# Patient Record
Sex: Female | Born: 1970
Health system: Southern US, Community
[De-identification: ages and names within clinical notes are randomized; demographics above are authoritative.]

## PROBLEM LIST (undated history)

## (undated) DIAGNOSIS — G459 Transient cerebral ischemic attack, unspecified: Secondary | ICD-10-CM

## (undated) DIAGNOSIS — E119 Type 2 diabetes mellitus without complications: Secondary | ICD-10-CM

## (undated) DIAGNOSIS — E669 Obesity, unspecified: Secondary | ICD-10-CM

## (undated) DIAGNOSIS — I82409 Acute embolism and thrombosis of unspecified deep veins of unspecified lower extremity: Secondary | ICD-10-CM

## (undated) DIAGNOSIS — G4730 Sleep apnea, unspecified: Secondary | ICD-10-CM

## (undated) DIAGNOSIS — J45909 Unspecified asthma, uncomplicated: Secondary | ICD-10-CM

## (undated) DIAGNOSIS — E7800 Pure hypercholesterolemia, unspecified: Secondary | ICD-10-CM

## (undated) DIAGNOSIS — I639 Cerebral infarction, unspecified: Secondary | ICD-10-CM

## (undated) DIAGNOSIS — I10 Essential (primary) hypertension: Secondary | ICD-10-CM

## (undated) DIAGNOSIS — D509 Iron deficiency anemia, unspecified: Secondary | ICD-10-CM

## (undated) HISTORY — PX: TUBAL LIGATION: SHX77

---

## 2007-07-04 ENCOUNTER — Emergency Department (HOSPITAL_COMMUNITY): Admission: EM | Admit: 2007-07-04 | Discharge: 2007-07-04 | Payer: Self-pay | Admitting: Family Medicine

## 2008-07-09 ENCOUNTER — Emergency Department (HOSPITAL_BASED_OUTPATIENT_CLINIC_OR_DEPARTMENT_OTHER): Admission: EM | Admit: 2008-07-09 | Discharge: 2008-07-09 | Payer: Self-pay | Admitting: Emergency Medicine

## 2009-03-19 ENCOUNTER — Emergency Department (HOSPITAL_BASED_OUTPATIENT_CLINIC_OR_DEPARTMENT_OTHER): Admission: EM | Admit: 2009-03-19 | Discharge: 2009-03-20 | Payer: Self-pay | Admitting: Emergency Medicine

## 2009-06-04 ENCOUNTER — Emergency Department (HOSPITAL_BASED_OUTPATIENT_CLINIC_OR_DEPARTMENT_OTHER): Admission: EM | Admit: 2009-06-04 | Discharge: 2009-06-04 | Payer: Self-pay | Admitting: Emergency Medicine

## 2009-06-04 ENCOUNTER — Ambulatory Visit: Payer: Self-pay | Admitting: Radiology

## 2009-09-05 ENCOUNTER — Emergency Department (HOSPITAL_BASED_OUTPATIENT_CLINIC_OR_DEPARTMENT_OTHER): Admission: EM | Admit: 2009-09-05 | Discharge: 2009-09-05 | Payer: Self-pay | Admitting: Emergency Medicine

## 2009-09-05 ENCOUNTER — Ambulatory Visit: Payer: Self-pay | Admitting: Diagnostic Radiology

## 2010-01-04 ENCOUNTER — Emergency Department (HOSPITAL_BASED_OUTPATIENT_CLINIC_OR_DEPARTMENT_OTHER): Admission: EM | Admit: 2010-01-04 | Discharge: 2010-01-04 | Payer: Self-pay | Admitting: Emergency Medicine

## 2010-03-17 ENCOUNTER — Emergency Department (HOSPITAL_BASED_OUTPATIENT_CLINIC_OR_DEPARTMENT_OTHER): Admission: EM | Admit: 2010-03-17 | Discharge: 2010-03-17 | Payer: Self-pay | Admitting: Emergency Medicine

## 2010-04-14 ENCOUNTER — Emergency Department (HOSPITAL_BASED_OUTPATIENT_CLINIC_OR_DEPARTMENT_OTHER): Admission: EM | Admit: 2010-04-14 | Discharge: 2010-04-14 | Payer: Self-pay | Admitting: Emergency Medicine

## 2010-07-17 ENCOUNTER — Emergency Department (HOSPITAL_BASED_OUTPATIENT_CLINIC_OR_DEPARTMENT_OTHER): Admission: EM | Admit: 2010-07-17 | Discharge: 2010-07-17 | Payer: Self-pay | Admitting: Emergency Medicine

## 2010-09-29 ENCOUNTER — Emergency Department (HOSPITAL_BASED_OUTPATIENT_CLINIC_OR_DEPARTMENT_OTHER)
Admission: EM | Admit: 2010-09-29 | Discharge: 2010-09-29 | Payer: Self-pay | Source: Home / Self Care | Admitting: Emergency Medicine

## 2010-10-01 LAB — URINALYSIS, ROUTINE W REFLEX MICROSCOPIC
Bilirubin Urine: NEGATIVE
Hgb urine dipstick: NEGATIVE
Ketones, ur: NEGATIVE mg/dL
Nitrite: NEGATIVE
Protein, ur: NEGATIVE mg/dL
Specific Gravity, Urine: 1.034 — ABNORMAL HIGH (ref 1.005–1.030)
Urine Glucose, Fasting: NEGATIVE mg/dL
Urobilinogen, UA: 0.2 mg/dL (ref 0.0–1.0)
pH: 6.5 (ref 5.0–8.0)

## 2010-10-01 LAB — PREGNANCY, URINE: Preg Test, Ur: NEGATIVE

## 2010-11-11 ENCOUNTER — Emergency Department (HOSPITAL_BASED_OUTPATIENT_CLINIC_OR_DEPARTMENT_OTHER)
Admission: EM | Admit: 2010-11-11 | Discharge: 2010-11-11 | Disposition: A | Discharge status: Home / Self Care | Payer: Self-pay | Attending: Emergency Medicine | Admitting: Emergency Medicine

## 2010-11-11 DIAGNOSIS — M79609 Pain in unspecified limb: Secondary | ICD-10-CM | POA: Insufficient documentation

## 2010-11-11 DIAGNOSIS — M722 Plantar fascial fibromatosis: Secondary | ICD-10-CM | POA: Insufficient documentation

## 2010-11-25 LAB — URINE MICROSCOPIC-ADD ON

## 2010-11-25 LAB — URINALYSIS, ROUTINE W REFLEX MICROSCOPIC
Bilirubin Urine: NEGATIVE
Glucose, UA: NEGATIVE mg/dL
Hgb urine dipstick: NEGATIVE
Ketones, ur: NEGATIVE mg/dL
Nitrite: NEGATIVE
Protein, ur: NEGATIVE mg/dL
Specific Gravity, Urine: 1.02 (ref 1.005–1.030)
Urobilinogen, UA: 1 mg/dL (ref 0.0–1.0)
pH: 7 (ref 5.0–8.0)

## 2010-11-25 LAB — GC/CHLAMYDIA PROBE AMP, GENITAL
Chlamydia, DNA Probe: NEGATIVE
GC Probe Amp, Genital: NEGATIVE

## 2010-11-25 LAB — WET PREP, GENITAL: Yeast Wet Prep HPF POC: NONE SEEN

## 2010-11-25 LAB — PREGNANCY, URINE: Preg Test, Ur: NEGATIVE

## 2010-11-30 LAB — URINALYSIS, ROUTINE W REFLEX MICROSCOPIC
Bilirubin Urine: NEGATIVE
Glucose, UA: NEGATIVE mg/dL
Hgb urine dipstick: NEGATIVE
Ketones, ur: NEGATIVE mg/dL
Nitrite: NEGATIVE
Protein, ur: NEGATIVE mg/dL
Specific Gravity, Urine: 1.025 (ref 1.005–1.030)
Urobilinogen, UA: 1 mg/dL (ref 0.0–1.0)
pH: 6 (ref 5.0–8.0)

## 2010-11-30 LAB — RAPID STREP SCREEN (MED CTR MEBANE ONLY): Streptococcus, Group A Screen (Direct): NEGATIVE

## 2010-11-30 LAB — PREGNANCY, URINE: Preg Test, Ur: NEGATIVE

## 2010-12-04 ENCOUNTER — Emergency Department (HOSPITAL_BASED_OUTPATIENT_CLINIC_OR_DEPARTMENT_OTHER)
Admission: EM | Admit: 2010-12-04 | Discharge: 2010-12-04 | Disposition: A | Discharge status: Home / Self Care | Payer: Self-pay | Attending: Emergency Medicine | Admitting: Emergency Medicine

## 2010-12-04 ENCOUNTER — Emergency Department (INDEPENDENT_AMBULATORY_CARE_PROVIDER_SITE_OTHER): Payer: Self-pay

## 2010-12-04 DIAGNOSIS — R0602 Shortness of breath: Secondary | ICD-10-CM

## 2010-12-04 DIAGNOSIS — R0609 Other forms of dyspnea: Secondary | ICD-10-CM | POA: Insufficient documentation

## 2010-12-04 DIAGNOSIS — R0789 Other chest pain: Secondary | ICD-10-CM

## 2010-12-04 DIAGNOSIS — R0989 Other specified symptoms and signs involving the circulatory and respiratory systems: Secondary | ICD-10-CM | POA: Insufficient documentation

## 2010-12-04 DIAGNOSIS — J45909 Unspecified asthma, uncomplicated: Secondary | ICD-10-CM | POA: Insufficient documentation

## 2010-12-04 LAB — CBC
HCT: 32.3 % — ABNORMAL LOW (ref 36.0–46.0)
Hemoglobin: 10.8 g/dL — ABNORMAL LOW (ref 12.0–15.0)
MCH: 26.4 pg (ref 26.0–34.0)
MCHC: 33.4 g/dL (ref 30.0–36.0)
MCV: 79 fL (ref 78.0–100.0)
Platelets: 295 10*3/uL (ref 150–400)
RBC: 4.09 MIL/uL (ref 3.87–5.11)
RDW: 14.1 % (ref 11.5–15.5)
WBC: 8.2 10*3/uL (ref 4.0–10.5)

## 2010-12-04 LAB — D-DIMER, QUANTITATIVE: D-Dimer, Quant: 0.39 ug/mL-FEU (ref 0.00–0.48)

## 2010-12-04 LAB — POCT B-TYPE NATRIURETIC PEPTIDE (BNP): B Natriuretic Peptide, POC: 7.1 pg/mL (ref 0–100)

## 2010-12-04 LAB — BASIC METABOLIC PANEL
Calcium: 8.9 mg/dL (ref 8.4–10.5)
GFR calc Af Amer: >60 mL/min (ref >60)
GFR calc non Af Amer: >60 mL/min (ref >60)
Potassium: 3.7 mEq/L (ref 3.5–5.1)
Sodium: 143 mEq/L (ref 135–145)

## 2010-12-04 LAB — DIFFERENTIAL
Basophils Absolute: 0 10*3/uL (ref 0.0–0.1)
Basophils Relative: 0 % (ref 0–1)
Eosinophils Absolute: 0.1 10*3/uL (ref 0.0–0.7)
Eosinophils Relative: 2 % (ref 0–5)
Lymphocytes Relative: 46 % (ref 12–46)
Lymphs Abs: 3.7 10*3/uL (ref 0.7–4.0)
Monocytes Absolute: 0.9 10*3/uL (ref 0.1–1.0)
Monocytes Relative: 11 % (ref 3–12)
Neutro Abs: 3.4 10*3/uL (ref 1.7–7.7)
Neutrophils Relative %: 41 % — ABNORMAL LOW (ref 43–77)

## 2010-12-04 LAB — POCT CARDIAC MARKERS
CKMB, poc: 1.1 ng/mL (ref 1.0–8.0)
Myoglobin, poc: 58.2 ng/mL (ref 12–200)
Troponin i, poc: 0.08 ng/mL (ref 0.00–0.09)

## 2011-02-28 ENCOUNTER — Emergency Department (HOSPITAL_BASED_OUTPATIENT_CLINIC_OR_DEPARTMENT_OTHER)
Admission: EM | Admit: 2011-02-28 | Discharge: 2011-03-01 | Disposition: A | Discharge status: Home / Self Care | Payer: Self-pay | Attending: Emergency Medicine | Admitting: Emergency Medicine

## 2011-02-28 DIAGNOSIS — H9209 Otalgia, unspecified ear: Secondary | ICD-10-CM | POA: Insufficient documentation

## 2011-02-28 DIAGNOSIS — M79609 Pain in unspecified limb: Secondary | ICD-10-CM | POA: Insufficient documentation

## 2011-03-01 ENCOUNTER — Ambulatory Visit (HOSPITAL_BASED_OUTPATIENT_CLINIC_OR_DEPARTMENT_OTHER)
Admission: RE | Admit: 2011-03-01 | Discharge: 2011-03-01 | Disposition: A | Discharge status: Home / Self Care | Payer: Self-pay | Source: Ambulatory Visit | Attending: Emergency Medicine | Admitting: Emergency Medicine

## 2011-03-01 DIAGNOSIS — M7989 Other specified soft tissue disorders: Secondary | ICD-10-CM | POA: Insufficient documentation

## 2011-03-01 DIAGNOSIS — M79609 Pain in unspecified limb: Secondary | ICD-10-CM | POA: Insufficient documentation

## 2011-04-04 ENCOUNTER — Emergency Department (HOSPITAL_BASED_OUTPATIENT_CLINIC_OR_DEPARTMENT_OTHER)
Admission: EM | Admit: 2011-04-04 | Discharge: 2011-04-04 | Disposition: A | Discharge status: Other Acute Inpatient Hospital | Payer: Self-pay | Attending: Emergency Medicine | Admitting: Emergency Medicine

## 2011-04-04 ENCOUNTER — Other Ambulatory Visit: Payer: Self-pay

## 2011-04-04 ENCOUNTER — Emergency Department (INDEPENDENT_AMBULATORY_CARE_PROVIDER_SITE_OTHER): Payer: Self-pay

## 2011-04-04 ENCOUNTER — Inpatient Hospital Stay (HOSPITAL_COMMUNITY)
Admission: AD | Admit: 2011-04-04 | Discharge: 2011-04-07 | DRG: 069 | Disposition: A | Discharge status: Home / Self Care | Payer: Self-pay | Source: Other Acute Inpatient Hospital | Attending: Internal Medicine | Admitting: Internal Medicine

## 2011-04-04 ENCOUNTER — Encounter: Payer: Self-pay | Admitting: Emergency Medicine

## 2011-04-04 DIAGNOSIS — R29898 Other symptoms and signs involving the musculoskeletal system: Secondary | ICD-10-CM | POA: Diagnosis present

## 2011-04-04 DIAGNOSIS — E785 Hyperlipidemia, unspecified: Secondary | ICD-10-CM | POA: Diagnosis present

## 2011-04-04 DIAGNOSIS — Q211 Atrial septal defect: Secondary | ICD-10-CM

## 2011-04-04 DIAGNOSIS — I824Z9 Acute embolism and thrombosis of unspecified deep veins of unspecified distal lower extremity: Secondary | ICD-10-CM | POA: Diagnosis present

## 2011-04-04 DIAGNOSIS — R42 Dizziness and giddiness: Secondary | ICD-10-CM | POA: Insufficient documentation

## 2011-04-04 DIAGNOSIS — G459 Transient cerebral ischemic attack, unspecified: Principal | ICD-10-CM | POA: Diagnosis present

## 2011-04-04 DIAGNOSIS — M6281 Muscle weakness (generalized): Secondary | ICD-10-CM

## 2011-04-04 DIAGNOSIS — J45909 Unspecified asthma, uncomplicated: Secondary | ICD-10-CM | POA: Diagnosis present

## 2011-04-04 DIAGNOSIS — Z8673 Personal history of transient ischemic attack (TIA), and cerebral infarction without residual deficits: Secondary | ICD-10-CM

## 2011-04-04 DIAGNOSIS — R61 Generalized hyperhidrosis: Secondary | ICD-10-CM

## 2011-04-04 DIAGNOSIS — D649 Anemia, unspecified: Secondary | ICD-10-CM | POA: Diagnosis present

## 2011-04-04 DIAGNOSIS — R110 Nausea: Secondary | ICD-10-CM

## 2011-04-04 DIAGNOSIS — G43909 Migraine, unspecified, not intractable, without status migrainosus: Secondary | ICD-10-CM | POA: Diagnosis present

## 2011-04-04 DIAGNOSIS — R51 Headache: Secondary | ICD-10-CM

## 2011-04-04 DIAGNOSIS — Z8679 Personal history of other diseases of the circulatory system: Secondary | ICD-10-CM | POA: Insufficient documentation

## 2011-04-04 DIAGNOSIS — Q2111 Secundum atrial septal defect: Secondary | ICD-10-CM

## 2011-04-04 DIAGNOSIS — F17200 Nicotine dependence, unspecified, uncomplicated: Secondary | ICD-10-CM | POA: Diagnosis present

## 2011-04-04 DIAGNOSIS — R5381 Other malaise: Secondary | ICD-10-CM | POA: Insufficient documentation

## 2011-04-04 DIAGNOSIS — Z6841 Body Mass Index (BMI) 40.0 and over, adult: Secondary | ICD-10-CM

## 2011-04-04 DIAGNOSIS — Z7901 Long term (current) use of anticoagulants: Secondary | ICD-10-CM

## 2011-04-04 HISTORY — DX: Obesity, unspecified: E66.9

## 2011-04-04 LAB — CARDIAC PANEL(CRET KIN+CKTOT+MB+TROPI)
CK, MB: 1.5 ng/mL (ref 0.3–4.0)
Relative Index: 1.1 (ref 0.0–2.5)
Total CK: 140 U/L (ref 7–177)

## 2011-04-04 LAB — COMPREHENSIVE METABOLIC PANEL
Albumin: 3.4 g/dL — ABNORMAL LOW (ref 3.5–5.2)
Alkaline Phosphatase: 103 U/L (ref 39–117)
BUN: 10 mg/dL (ref 6–23)
CO2: 26 mEq/L (ref 19–32)
Chloride: 101 mEq/L (ref 96–112)
Glucose, Bld: 99 mg/dL (ref 70–99)
Potassium: 4 mEq/L (ref 3.5–5.1)
Total Bilirubin: 0.2 mg/dL — ABNORMAL LOW (ref 0.3–1.2)

## 2011-04-04 LAB — CBC
Hemoglobin: 10.8 g/dL — ABNORMAL LOW (ref 12.0–15.0)
RBC: 4.22 MIL/uL (ref 3.87–5.11)
WBC: 5.9 10*3/uL (ref 4.0–10.5)

## 2011-04-04 LAB — DIFFERENTIAL
Basophils Relative: 1 % (ref 0–1)
Lymphocytes Relative: 47 % — ABNORMAL HIGH (ref 12–46)
Lymphs Abs: 2.8 10*3/uL (ref 0.7–4.0)
Monocytes Relative: 10 % (ref 3–12)
Neutro Abs: 2.5 10*3/uL (ref 1.7–7.7)
Neutrophils Relative %: 41 % — ABNORMAL LOW (ref 43–77)

## 2011-04-04 LAB — URINALYSIS, ROUTINE W REFLEX MICROSCOPIC
Glucose, UA: NEGATIVE mg/dL
Hgb urine dipstick: NEGATIVE
Protein, ur: NEGATIVE mg/dL

## 2011-04-04 LAB — PROTIME-INR: Prothrombin Time: 13.4 seconds (ref 11.6–15.2)

## 2011-04-04 MED ORDER — ASPIRIN 325 MG PO TABS
325.0000 mg | ORAL_TABLET | Freq: Once | ORAL | Status: AC
  Administered 2011-04-04: 325 mg via ORAL
  Filled 2011-04-04: qty 1

## 2011-04-04 NOTE — ED Provider Notes (Signed)
History     Chief Complaint  Patient presents with  . Extremity Weakness   HPI Comments: Pt was at work last night and felt hot and sweaty and had weakness in the left side of her body.  This happened about midnight.  The weakness lasted for about one hour.  There was no pain.  She got off work, working at [employer], about 6 A.M. and felt the weakness in her left side again.  She therefore sought evaluation.  Patient is a 40 y.o. female presenting with Acute Neurological Problem. The history is provided by the patient. No language interpreter was used.  Cerebrovascular Accident This is a new problem. The current episode started 6 to 12 hours ago. The problem has not changed since onset.The symptoms are aggravated by nothing. The symptoms are relieved by nothing. She has tried nothing for the symptoms.    Past Medical History  Diagnosis Date  . Obesity     Past Surgical History  Procedure Date  . Tubal ligation     History reviewed. No pertinent family history.  History  Substance Use Topics  . Smoking status: Current Some Day Smoker  . Smokeless tobacco: Not on file  . Alcohol Use: No    OB History    Grav Para Term Preterm Abortions TAB SAB Ect Mult Living                  Review of Systems  Constitutional: Positive for fatigue.  HENT: Negative.   Eyes: Negative.   Respiratory: Negative.   Cardiovascular: Negative.   Gastrointestinal: Negative.   Genitourinary: Negative.   Musculoskeletal: Negative.   Neurological: Positive for weakness and light-headedness.  Psychiatric/Behavioral: Negative.     Physical Exam  BP 165/98  Pulse 90  Temp(Src) 98.2 F (36.8 C) (Oral)  Resp 18  SpO2 100%  Physical Exam  Constitutional: She is oriented to person, place, and time. She appears well-developed and well-nourished. No distress.  HENT:  Head: Normocephalic and atraumatic.  Right Ear: External ear normal.  Left Ear: External ear normal.  Eyes:  Conjunctivae and EOM are normal. Pupils are equal, round, and reactive to light.  Neck: Normal range of motion. Neck supple.       No carotid bruit.  Cardiovascular: Normal rate, regular rhythm and normal heart sounds.   Pulmonary/Chest: Effort normal and breath sounds normal.  Abdominal: Soft. Bowel sounds are normal.  Musculoskeletal: Normal range of motion.  Neurological: She is alert and oriented to person, place, and time. No cranial nerve deficit or sensory deficit. GCS eye subscore is 4. GCS verbal subscore is 5. GCS motor subscore is 6.       Mild left sided weakness, more notable in the left leg than in the left arm.  DTR's negative, perhaps because of pt's body habitus.  Skin: Skin is warm and dry.  Psychiatric: She has a normal mood and affect. Her behavior is normal.    ED Course  Procedures  MDM  Pt seen --> physical exam performed.  Lab workup for TIA ordered.  ASA 325 mg po ordered.   ED ECG REPORT   Date: 04/04/2011  EKG Time: 7:42 AM  Rate:81  Rhythm: normal sinus rhythm  Axis: Normal  Intervals: Incomplete right bundle branch block  ST&T Change: WNL  Narrative Interpretation: Borderline EKG.    9:30 A.M.  Lab results reviewed.  CBC shows mild anemia with Hb 10.8 and Hct 33.  CMET, PT/PTT WNL.  UA negative.  CT of head is negative.       Case discussed with Thana Farr, M.D., neurologist, who requests that pt have MRI of brain now, and then call her back with the result.  12:45 P.M.  MRI of brain was negative.  Call to Dr. Thad Ranger --> pt can be admitted at [facility] for TIA workup, and she will consult.    1:10 P.M.  Case discussed with Dr. Rossie Muskrat, who accepts pt in transfer to [facility]   Carleene Cooper III, MD 04/04/11 1304

## 2011-04-04 NOTE — ED Notes (Signed)
Pt c/o left sided weakness. Pt reports episode at midnight of left sided weakness with diaphoresis lasting approx 25min-1 hour. Pt reports weakness returned this morning. Pt is is able to move all extremities without difficulty. Pt ambulatory without difficulty. No slurred speech noted.

## 2011-04-05 ENCOUNTER — Inpatient Hospital Stay (HOSPITAL_COMMUNITY): Payer: Self-pay

## 2011-04-05 LAB — IRON AND TIBC
Iron: 61 ug/dL (ref 42–135)
Saturation Ratios: 17 % — ABNORMAL LOW (ref 20–55)
TIBC: 356 ug/dL (ref 250–470)
UIBC: 295 ug/dL

## 2011-04-05 LAB — LIPID PANEL
LDL Cholesterol: 134 mg/dL — ABNORMAL HIGH (ref 0–99)
Triglycerides: 68 mg/dL (ref <150)

## 2011-04-05 LAB — HOMOCYSTEINE: Homocysteine: 6.8 umol/L (ref 4.0–15.4)

## 2011-04-05 LAB — FOLATE: Folate: 14.2 ng/mL

## 2011-04-05 LAB — FERRITIN: Ferritin: 16 ng/mL (ref 10–291)

## 2011-04-05 LAB — HEMOGLOBIN A1C: Hgb A1c MFr Bld: 5.8 % — ABNORMAL HIGH (ref <5.7)

## 2011-04-05 LAB — GLUCOSE, CAPILLARY: Glucose-Capillary: 86 mg/dL (ref 70–99)

## 2011-04-06 DIAGNOSIS — I6789 Other cerebrovascular disease: Secondary | ICD-10-CM

## 2011-04-06 DIAGNOSIS — G459 Transient cerebral ischemic attack, unspecified: Secondary | ICD-10-CM

## 2011-04-06 LAB — LUPUS ANTICOAGULANT PANEL: Lupus Anticoagulant: NOT DETECTED

## 2011-04-06 LAB — PROTEIN C ACTIVITY: Protein C Activity: 149 % — ABNORMAL HIGH (ref 75–133)

## 2011-04-06 LAB — PROTEIN S, TOTAL: Protein S Ag, Total: 136 % (ref 60–150)

## 2011-04-06 LAB — ANTITHROMBIN III: AntiThromb III Func: 85 % (ref 76–126)

## 2011-04-06 LAB — PROTIME-INR: INR: 0.99 (ref 0.00–1.49)

## 2011-04-06 LAB — PROTHROMBIN GENE MUTATION

## 2011-04-07 LAB — CBC
MCH: 26.1 pg (ref 26.0–34.0)
MCV: 78.5 fL (ref 78.0–100.0)
Platelets: 312 10*3/uL (ref 150–400)
RBC: 4.37 MIL/uL (ref 3.87–5.11)

## 2011-04-07 LAB — BETA-2-GLYCOPROTEIN I ABS, IGG/M/A: Beta-2-Glycoprotein I IgA: 3 A Units (ref <20)

## 2011-04-07 LAB — HIV ANTIBODY (ROUTINE TESTING W REFLEX): HIV: NONREACTIVE

## 2011-04-07 LAB — CARDIOLIPIN ANTIBODIES, IGG, IGM, IGA
Anticardiolipin IgA: 5 APL U/mL — ABNORMAL LOW (ref <22)
Anticardiolipin IgM: 2 MPL U/mL — ABNORMAL LOW (ref <11)

## 2011-04-08 ENCOUNTER — Encounter (HOSPITAL_BASED_OUTPATIENT_CLINIC_OR_DEPARTMENT_OTHER): Payer: Self-pay | Admitting: *Deleted

## 2011-04-08 ENCOUNTER — Emergency Department (HOSPITAL_BASED_OUTPATIENT_CLINIC_OR_DEPARTMENT_OTHER)
Admission: EM | Admit: 2011-04-08 | Discharge: 2011-04-08 | Disposition: A | Discharge status: Home / Self Care | Payer: Self-pay | Attending: Emergency Medicine | Admitting: Emergency Medicine

## 2011-04-08 ENCOUNTER — Emergency Department (INDEPENDENT_AMBULATORY_CARE_PROVIDER_SITE_OTHER): Payer: Self-pay

## 2011-04-08 DIAGNOSIS — E669 Obesity, unspecified: Secondary | ICD-10-CM | POA: Insufficient documentation

## 2011-04-08 DIAGNOSIS — E7800 Pure hypercholesterolemia, unspecified: Secondary | ICD-10-CM | POA: Insufficient documentation

## 2011-04-08 DIAGNOSIS — M549 Dorsalgia, unspecified: Secondary | ICD-10-CM | POA: Insufficient documentation

## 2011-04-08 DIAGNOSIS — IMO0002 Reserved for concepts with insufficient information to code with codable children: Secondary | ICD-10-CM

## 2011-04-08 DIAGNOSIS — Z8679 Personal history of other diseases of the circulatory system: Secondary | ICD-10-CM | POA: Insufficient documentation

## 2011-04-08 HISTORY — DX: Cerebral infarction, unspecified: I63.9

## 2011-04-08 HISTORY — DX: Acute embolism and thrombosis of unspecified deep veins of unspecified lower extremity: I82.409

## 2011-04-08 HISTORY — DX: Pure hypercholesterolemia, unspecified: E78.00

## 2011-04-08 LAB — ANA: Anti Nuclear Antibody(ANA): NEGATIVE

## 2011-04-08 MED ORDER — CYCLOBENZAPRINE HCL 10 MG PO TABS: 10.0000 mg | ORAL_TABLET | Freq: Two times a day (BID) | ORAL | Status: AC | PRN

## 2011-04-08 MED ORDER — HYDROCODONE-ACETAMINOPHEN 5-500 MG PO TABS: 1.0000 | ORAL_TABLET | Freq: Four times a day (QID) | ORAL | Status: AC | PRN

## 2011-04-08 MED ORDER — DIAZEPAM 5 MG PO TABS
5.0000 mg | ORAL_TABLET | Freq: Once | ORAL | Status: AC
  Administered 2011-04-08: 5 mg via ORAL
  Filled 2011-04-08: qty 1

## 2011-04-08 MED ORDER — HYDROMORPHONE HCL 1 MG/ML IJ SOLN
1.0000 mg | Freq: Once | INTRAMUSCULAR | Status: AC
  Administered 2011-04-08: 1 mg via INTRAMUSCULAR
  Filled 2011-04-08: qty 1

## 2011-04-08 NOTE — ED Provider Notes (Signed)
History     Chief Complaint  Patient presents with  . Back Pain   Patient is a 40 y.o. female presenting with back pain. The history is provided by the patient.  Back Pain  This is a new problem. The current episode started 3 to 5 hours ago. The problem occurs constantly. The problem has not changed since onset.The pain is associated with twisting. The pain is present in the lumbar spine. The quality of the pain is described as shooting. The pain radiates to the left thigh. The pain is severe. The symptoms are aggravated by certain positions. The pain is the same all the time. Associated symptoms include leg pain. Pertinent negatives include no chest pain, no fever, no numbness, no headaches, no abdominal pain, no perianal numbness, no bladder incontinence, no dysuria, no paresthesias, no paresis, no tingling and no weakness. She has tried nothing for the symptoms. Risk factors include obesity.   Was in bed and when she went to get up had sudden onset of L sided LBP that radiates to her leg, she denies any h/o LBP or prior back injury.  She was recently hopsitalized for TIA and DVT  Past Medical History  Diagnosis Date  . Obesity   . Stroke   . DVT (deep venous thrombosis)   . High cholesterol     Past Surgical History  Procedure Date  . Tubal ligation     History reviewed. No pertinent family history.  History  Substance Use Topics  . Smoking status: Current Some Day Smoker  . Smokeless tobacco: Not on file  . Alcohol Use: No    OB History    Grav Para Term Preterm Abortions TAB SAB Ect Mult Living                  Review of Systems  Constitutional: Negative for fever and chills.  HENT: Negative for neck pain and neck stiffness.   Eyes: Negative for pain.  Respiratory: Negative for cough and shortness of breath.   Cardiovascular: Negative for chest pain.  Gastrointestinal: Negative for abdominal pain.  Genitourinary: Negative for bladder incontinence and dysuria.    Musculoskeletal: Positive for back pain.  Skin: Negative for rash.  Neurological: Negative for tingling, weakness, numbness, headaches and paresthesias.  All other systems reviewed and are negative.    Physical Exam  BP 132/75  Pulse 68  Temp(Src) 98 F (36.7 C) (Oral)  Resp 20  Ht 5\' 4"  (1.626 m)  Wt 320 lb (145.151 kg)  BMI 54.93 kg/m2  SpO2 100%  Physical Exam  Constitutional: She is oriented to person, place, and time. She appears well-developed and well-nourished.  HENT:  Head: Normocephalic and atraumatic.  Eyes: Conjunctivae and EOM are normal. Pupils are equal, round, and reactive to light.  Neck: Full passive range of motion without pain. Neck supple. No thyromegaly present.       No meningismus  Cardiovascular: Normal rate, regular rhythm, S1 normal, S2 normal and intact distal pulses.   Pulmonary/Chest: Effort normal and breath sounds normal.  Abdominal: Soft. Bowel sounds are normal. There is no tenderness. There is no CVA tenderness.  Musculoskeletal: Normal range of motion.       Mod TTP L paralumabar and lower lumbar spine, no LE deficits with equal and intact DTR, strength and sensorium to light touch.   Neurological: She is alert and oriented to person, place, and time. She has normal strength and normal reflexes. No cranial nerve deficit or sensory deficit.  She displays a negative Romberg sign. GCS eye subscore is 4. GCS verbal subscore is 5. GCS motor subscore is 6.       Normal Gait  Skin: Skin is warm and dry. No rash noted. No cyanosis. Nails show no clubbing.  Psychiatric: She has a normal mood and affect. Her speech is normal and behavior is normal.    ED Course  Procedures  MDM Presentation c/w low back injury, has reproducible tenderness.   Recheck at 6:48 AM is feeling a lot better, is able to ambulate, has no red flags to suggest indication for emergent MRI. PT has scheduled PCP f/u in 2 days to check her INR.  Plan conservative Tx of back pain  and close PCP follow up. PT states understanding strict return precautions.       Sunnie Nielsen, MD 04/08/11 [phone]

## 2011-04-08 NOTE — ED Notes (Signed)
Pt has been taking her scheduled doses of coumadin PO and Lovenox SQ, but has not started her simvastatin yet. Pt states she felt fine until midnight tonight, when she got up to urinate and had a sudden severe onset of left lower back pain that radiates downward into the hip and wraps around anteriorly, shooting downward into the right thigh and stops at the knee. Pt has difficulty ambulating, and requires assistance. Son came with pt to the hospital. No obvious swelling noted to leg. Pulses present.

## 2011-04-08 NOTE — ED Notes (Signed)
Left lower back pain x approx 5 hours. Radiates downward through left leg to the knee. Denies any known injury. Denies urinary symptoms. Pt was recently discharged from [facility] yesterday with dx of "light stroke" and left leg DVT. Pt is currently on coumadin/lovenox.

## 2011-04-10 LAB — CARDIOLIPIN ANTIBODIES, IGG, IGM, IGA: Anticardiolipin IgM: 1 MPL U/mL — ABNORMAL LOW (ref <11)

## 2011-04-14 NOTE — H&P (Signed)
NAMEMARQUERITE, Terri Boyd               ACCOUNT NO.:  [id number]  MEDICAL RECORD NO.:  [id number]  LOCATION:  3001                         FACILITY:  [facility]  PHYSICIAN:  Candelaria Celeste, DO      DATE OF BIRTH:  26-Jan-1971  DATE OF ADMISSION:  04/04/2011 DATE OF DISCHARGE:                             HISTORY & PHYSICAL   PRIMARY CARE PROVIDER:  Magnus Sinning. Rice, MD  CHIEF COMPLAINT:  Left-sided weakness.  HISTORY OF PRESENT ILLNESS:  Ms. Boyd is a 40 year old morbidly obese African American female who has borderline hypertension who presents with a left-sided weakness that began early this morning.  The patient states that around midnight last night she was working at [employer] and became flushed, sweating, and started to have heaviness/weakness down in the left side greater in upper extremity than lower extremity.  She stated that she was having a difficult time ambulating due to the heaviness in her leg.  The symptoms lasted for about an hour and then started again as she was leaving work at 6 o'clock in the morning.  She states that nothing improved the symptoms. She states that overall her symptoms have greatly improved, although she still feels some paresthesias in her fingers and toes and states that she still feels mildly weak in her left upper extremity.  She had a migraine yesterday, but otherwise is feeling well.  She denies current headache, vision changes, tinnitus, palpitations, shortness of breath, fevers, shaking chills, night sweats.  PAST MEDICAL HISTORY: 1. History of Bell palsy. 2. Obesity. 3. Mild borderline hypertension. 4. Migraines.  PAST SURGICAL HISTORY:  Bilateral tubal ligation.  MEDICATIONS:  None.  ALLERGIES:  None.  FAMILY HISTORY:  Significant for hypertension and hypercholesterolemia in her mother and father as well as maternal grandmother.  The mother also has a history of leukemia.  SOCIAL HISTORY:  The patient is not currently  married and lives with her four children.  She works 2 jobs.  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 She describes intermittent smoking for the past year though she states that she quit smoking in April of this year.  She drinks wine on occasion and denies illicit drug use.  REVIEW OF SYSTEMS: A 10-system review of systems was performed which was  as stated in HPI, otherwise negative.  PHYSICAL EXAMINATION:  VITAL SIGNS:  Temperature is 98.1, heart rate is 80, blood pressure 130/82, respiratory 17, oxygen saturation 97% on room air. GENERAL:  This is a morbidly obese African American female who is awake, alert, and oriented x3 in no acute distress. HEENT:  Head normocephalic, atraumatic.  Pupils equal, round, reactive to light.  Extraocular muscles intact.  Sclerae are anicteric, noninjected.  Tympanic membranes are translucent with reflected good cone of light.  The posterior oropharynx is nonerythematous with nonenlarged tonsils and uvula rising in the midline. NECK:  Supple without lymphadenopathy.  There is no jugular venous distention or carotid bruit present. LUNGS:  Clear to auscultation bilaterally with no wheezes, rales, or rhonchi. CARDIAC:  Regular rate with normal S1 and S2 sounds with no murmurs auscultated.  ABDOMEN:  Obese, but soft and nontender, nondistended. VASCULAR:  Dorsalis pedis renal pulses 2+ with warm skin and brisk capillary refill. NEURO:  Cranial nerves II through XII are intact.  There is 4/5 strength in the left upper extremity.  Right upper extremity and bilateral lower extremities are 5/5 in muscle strength.  There is a slight lag in rapid alternating movements in the left upper extremity.  Heel-to-shin test was normal as well as finger-to-nose test bilaterally.  There is no pronator drift. MUSCULOSKELETAL:  There is moderate left calf tenderness.  LABORATORY DATA: 1. CBC showed a  hemoglobin of 10.8, hematocrit of 33.0 with white     count of 5.9, and platelets of 332.  CMP was normal. 2. PT, PTT, and INR showed an INR of 1.0.  PT of 13.4 and PTT of 31. 3. Urinalysis was negative for blood, ketones, nitrites, or leukocyte     esterase. 4. CT of the head showed no significant abnormality. 5. MRI showed no focal area of ischemia or hemorrhage.  The     intracranial vascular structures appeared patent.  An EKG was performed, which showed a rate of 81 with a PR interval of 0.142 and a QRS interval of 0.1, which showed normal sinus rhythm with no acute ST elevation or depression.  IMPRESSION: 1. Transient ischemic attack/left-sided weakness. 2. Left calf pain. 3. Borderline hypertension. 4. Obesity.  We will admit the patient under TIA admission criteria.  The patient has a ABCD squared score for 4 thus we will admit the patient to observation.  We will obtain a Neurology consult.  We will continue to monitor the patient in the telemetry and continue with q.4 h. vitals and neurology checks.  We will obtain a fasting lipid profile and a hemoglobin A1c as well as cardiac enzymes.  We will obtain a lower extremity ultrasound duplex to rule out a DVT due to the patient's left calf pain.  For medications, we will start the patient on full aspirin daily, which we well encourage her to take daily as an outpatient.  We will also start the patient on enoxaparin 40 mg subcutaneously for DVT prophylaxis.  Due to the patient's young age, we will obtain a hypercoagulability panel to evaluate for this.          ______________________________ Candelaria Celeste, DO     JS/MEDQ  D:  04/04/2011  T:  39/22/2012  Job:  [id number]  cc:   Magnus Sinning. Rice, M.D.  Electronically Signed by Candelaria Celeste DO on 04/14/2011 04:16:20 PM

## 2011-04-16 NOTE — Discharge Summary (Signed)
  Terri Boyd, Terri Boyd               ACCOUNT NO.:  [id number]  MEDICAL RECORD NO.:  [id number]  LOCATION:  3001                         FACILITY:  [facility]  PHYSICIAN:  Zannie Cove, MD     DATE OF BIRTH:  Aug 05, 1971  DATE OF ADMISSION:  04/04/2011 DATE OF DISCHARGE:                              DISCHARGE SUMMARY   PRIMARY CARE PHYSICIAN:  [facility].  DISCHARGE DIAGNOSES: 1. Transient ischemic attack. 2. Left calf nonocclusive deep venous thrombosis. 3. Normocytic anemia. 4. Morbid obesity. 5. Dyslipidemia. 6. Tobacco use. 7. Small PFO on TEE.  DISCHARGE MEDICATIONS: 1. Coumadin 10 mg p.o. for April 07, 2011, April 08, 2011, and then     based on INR, April 09, 2011. 2. Zocor 20 mg p.o. daily. 3. Lovenox 145 mg subcu b.i.d. for 4-5 days until INR greater than 2. 4. Tylenol 650 mg q.4 h p.r.n.  CONSULTANTS:  Dr. Pearlean Boyd and Dr. Roseanne Reno with Neurology.  DIAGNOSTICS/INVESTIGATIONS:  Include CT of the head, which was unremarkable.  MRI of the brain was normal.  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  HOSPITAL COURSE:  Terri Boyd is a 40 year old morbidly obese African- American female, who presented to the hospital with left-sided weakness and subsequently admitted to be ruled out for TIA.  In addition, she also complained of left leg pain. 1. For her TIA, she had an MRI and MRA done, which is unremarkable,     seen by Dr. Pearlean Boyd and Dr. Roseanne Reno in the hospital.  Initially, put     on aspirin, subsequently transitioned to Coumadin since she was     also found to have a DVT.  In terms of her workup for TIA, she had     a hypercoagulable panel, which included protein C and S, which were     negative, lupus anticoagulant was negative, antithrombin III level     was negative, and her prothrombin gene mutation was negative.  She     will be continued on Coumadin for now to complete her 6 months     course for treatment for DVT and then  switch to full strength     aspirin.  She was also advised to follow up with Dr. Pearlean Boyd in two     months. 2. Nonocclusive left leg DVT, for this hypercoagulable panel as noted     above the negative.  Continued on Lovenox and Coumadin for three     more days of bridging until INR greater than 2 and continue     Coumadin for at least six more months. The patient is advised on weight loss, tobacco cessation, and lifestyle modification.  DISCHARGE FOLLOWUP: 1. [facility] within the next month. 2. INR check on April 09, 2011. 3. Dr. Pearlean Boyd in 2 months.     Zannie Cove, MD     PJ/MEDQ  D:  04/07/2011  T:  04/07/2011  Job:  [id number]  cc:   [facility] Terri P. Pearlean Brownie, MD  Electronically Signed by Zannie Cove  on 04/16/2011 04:42:14 PM

## 2011-12-08 ENCOUNTER — Emergency Department (INDEPENDENT_AMBULATORY_CARE_PROVIDER_SITE_OTHER): Payer: Self-pay

## 2011-12-08 ENCOUNTER — Emergency Department (HOSPITAL_BASED_OUTPATIENT_CLINIC_OR_DEPARTMENT_OTHER)
Admission: EM | Admit: 2011-12-08 | Discharge: 2011-12-08 | Disposition: A | Discharge status: Home Health | Payer: Self-pay | Attending: Emergency Medicine | Admitting: Emergency Medicine

## 2011-12-08 ENCOUNTER — Encounter (HOSPITAL_BASED_OUTPATIENT_CLINIC_OR_DEPARTMENT_OTHER): Payer: Self-pay | Admitting: Family Medicine

## 2011-12-08 DIAGNOSIS — R51 Headache: Secondary | ICD-10-CM

## 2011-12-08 DIAGNOSIS — Z8673 Personal history of transient ischemic attack (TIA), and cerebral infarction without residual deficits: Secondary | ICD-10-CM | POA: Insufficient documentation

## 2011-12-08 DIAGNOSIS — Z79899 Other long term (current) drug therapy: Secondary | ICD-10-CM | POA: Insufficient documentation

## 2011-12-08 DIAGNOSIS — Z86718 Personal history of other venous thrombosis and embolism: Secondary | ICD-10-CM | POA: Insufficient documentation

## 2011-12-08 DIAGNOSIS — E7800 Pure hypercholesterolemia, unspecified: Secondary | ICD-10-CM | POA: Insufficient documentation

## 2011-12-08 DIAGNOSIS — E669 Obesity, unspecified: Secondary | ICD-10-CM | POA: Insufficient documentation

## 2011-12-08 DIAGNOSIS — G43109 Migraine with aura, not intractable, without status migrainosus: Secondary | ICD-10-CM | POA: Insufficient documentation

## 2011-12-08 MED ORDER — METOCLOPRAMIDE HCL 5 MG/ML IJ SOLN
10.0000 mg | Freq: Once | INTRAMUSCULAR | Status: AC
  Administered 2011-12-08: 10 mg via INTRAVENOUS
  Filled 2011-12-08: qty 2

## 2011-12-08 MED ORDER — DIPHENHYDRAMINE HCL 50 MG/ML IJ SOLN
25.0000 mg | Freq: Once | INTRAMUSCULAR | Status: AC
  Administered 2011-12-08: 12:00:00 via INTRAVENOUS
  Filled 2011-12-08: qty 1

## 2011-12-08 MED ORDER — DEXAMETHASONE SODIUM PHOSPHATE 10 MG/ML IJ SOLN
10.0000 mg | Freq: Once | INTRAMUSCULAR | Status: AC
  Administered 2011-12-08: 10 mg via INTRAVENOUS
  Filled 2011-12-08: qty 1

## 2011-12-08 MED ORDER — SODIUM CHLORIDE 0.9 % IV BOLUS (SEPSIS)
1000.0000 mL | Freq: Once | INTRAVENOUS | Status: AC
  Administered 2011-12-08: 1000 mL via INTRAVENOUS

## 2011-12-08 NOTE — ED Notes (Signed)
Patient transported to CT 

## 2011-12-08 NOTE — ED Notes (Signed)
Pt c/o left sided headache off and on x 2 wks. Pt reports "sleep usually resolves headache" but has not this time. Pt denies taking otc med. Pt sts this h/a is similar to previous in past.

## 2011-12-08 NOTE — ED Provider Notes (Signed)
History     CSN: [id number]  Arrival date & time 12/08/11  1100   First MD Initiated Contact with Patient 12/08/11 1132      Chief Complaint  Patient presents with  . Headache    (Consider location/radiation/quality/duration/timing/severity/associated sxs/prior treatment) HPI The patient is a 41 yo F with history of headaches who presents today complaining of headaches over the past few days.  She rates these as an 8/10.  They are focused on the left side of her head and behind her eye.  She had nausea but denies vomiting.  She also endorses heaviness of her left leg.  She denies any other symptoms.  The sensation in her leg has also been going on for several days.  Patient has no other symptoms.  There are no other associated or modifying factors.  Past Medical History  Diagnosis Date  . Obesity   . Stroke   . DVT (deep venous thrombosis)   . High cholesterol     Past Surgical History  Procedure Date  . Tubal ligation     History reviewed. No pertinent family history.  History  Substance Use Topics  . Smoking status: Former Games developer  . Smokeless tobacco: Not on file  . Alcohol Use: No    OB History    Grav Para Term Preterm Abortions TAB SAB Ect Mult Living                  Review of Systems  Eyes: Negative.   Respiratory: Negative.   Cardiovascular: Negative.   Gastrointestinal: Negative.   Genitourinary: Negative.   Musculoskeletal: Negative.   Skin: Negative.   Neurological: Positive for weakness and headaches.  Hematological: Negative.   Psychiatric/Behavioral: Negative.   All other systems reviewed and are negative.    Allergies  Review of patient's allergies indicates no known allergies.  Home Medications   Current Outpatient Rx  Name Route Sig Dispense Refill  . ENOXAPARIN SODIUM 150 MG/ML [place] SOLN Subcutaneous Inject 145 mg into the skin every 12 (twelve) hours.      Marland Kitchen SIMVASTATIN 20 MG PO TABS Oral Take 20 mg by mouth at bedtime.      .  WARFARIN SODIUM 10 MG PO TABS Oral Take 10 mg by mouth daily.        BP 149/94  Pulse 84  Temp 97.9 F (36.6 C)  Resp 16  SpO2 100%  LMP 11/13/2011  Physical Exam  Nursing note and vitals reviewed. GEN: Well-developed, well-nourished female in no distress HEENT: Atraumatic, normocephalic. Oropharynx clear without erythema EYES: PERRLA BL, no scleral icterus. NECK: Trachea midline, no meningismus CV: regular rate and rhythm. No murmurs, rubs, or gallops PULM: No respiratory distress.  No crackles, wheezes, or rales. GI: soft, non-tender. No guarding, rebound, or tenderness. + bowel sounds  GU: deferred Neuro: cranial nerves 2-12 intact, no abnormalities of strength or sensation, A and O x 3 MSK: Patient moves all 4 extremities symmetrically, no deformity, edema, or injury noted Skin: No rashes petechiae, purpura, or jaundice Psych: no abnormality of mood  ED Course  Procedures (including critical care time)  Labs Reviewed - No data to display Ct Head Wo Contrast  12/08/2011  *RADIOLOGY REPORT*  Clinical Data: Left sided headache.  CT HEAD WITHOUT CONTRAST  Technique:  Contiguous axial images were obtained from the base of the skull through the vertex without contrast.  Comparison: 04/04/2011  Findings: No acute intracranial abnormality.  Specifically, no hemorrhage, hydrocephalus, mass lesion, acute infarction,  or significant intracranial injury.  No acute calvarial abnormality. Visualized paranasal sinuses and mastoids clear.  Orbital soft tissues unremarkable.  IMPRESSION: No acute intracranial abnormality.  Original Report Authenticated By: Cyndie Chime, M.D.     1. Complicated migraine       MDM  The patient was evaluated by myself.  She was treated with reglan, benadryl and IVF with resolution of her symptoms.  Given her leg symptoms I did perform a head CT which was negative.  This also resolved with her headache.  Decadron was given and the patient was discharged in  good condition.  She was told to follow up with her regular doctor as needed.        Cyndra Numbers, MD 12/08/11 2156

## 2011-12-08 NOTE — ED Notes (Signed)
Pt returned from CT °

## 2011-12-08 NOTE — Discharge Instructions (Signed)
Recurrent Migraine Headache You have a recurrent migraine headache. The caregiver can usually provide good relief for this headache. If this headache is the same as your previous migraine headaches, it is safe to treat you without repeating a complete evaluation.   These headaches usually have at least two of the following problems:   They occur on one side of the head, pulsate, and are severe enough to prevent daily activities.   They are aggravated by daily physical activities.  You may have one or more of the following symptoms:   Nausea (feeling sick to your stomach).   Vomiting.   Pain with exposure to bright lights or loud noises.  Most headache sufferers have a family history of migraines. Your headaches may also be related to alcohol and smoking habits. Too much sleep, too little sleep, mood, and anxiety may also play a part. Changing some of these triggers may help you lower the number and level of pain of the headaches. Headaches may be related to menses (female menstruation). There are numerous medications that can prevent these headaches. Your caregiver can help you with a medication or regimen (procedure to follow). If this has been a chronic (long-term) condition, the use of long-term narcotics is not recommended. Using long-term narcotics can cause recurrent migraines. Narcotics are only a temporary measure only. They are used for the infrequent migraine that fails to respond to all other measures. SEEK MEDICAL CARE IF:    You do not get relief from the medications given to you.   You have a recurrence of pain.   This headache begins to differ from past migraine (for example if it is more severe).  SEEK IMMEDIATE MEDICAL CARE IF:  You have a fever.   You have a stiff neck.   You have vision loss or have changes in vision.   You have problems with feeling lightheaded, become faint, or lose your balance.   You have muscular weakness.   You have loss of muscular  control.   You develop severe symptoms different from your first symptoms.   You start losing your balance or have trouble walking.   You feel faint or pass out.  MAKE SURE YOU:    Understand these instructions.   Will watch your condition.   Will get help right away if you are not doing well or get worse.  Document Released: 05/26/2001 Document Revised: 08/20/2011 Document Reviewed: 04/19/2008 [employer] Patient Information 2012 [employer]. 

## 2011-12-27 ENCOUNTER — Encounter (HOSPITAL_BASED_OUTPATIENT_CLINIC_OR_DEPARTMENT_OTHER): Payer: Self-pay | Admitting: *Deleted

## 2011-12-27 ENCOUNTER — Emergency Department (HOSPITAL_BASED_OUTPATIENT_CLINIC_OR_DEPARTMENT_OTHER)
Admission: EM | Admit: 2011-12-27 | Discharge: 2011-12-28 | Disposition: A | Discharge status: Home / Self Care | Payer: Self-pay | Attending: Emergency Medicine | Admitting: Emergency Medicine

## 2011-12-27 DIAGNOSIS — E7800 Pure hypercholesterolemia, unspecified: Secondary | ICD-10-CM | POA: Insufficient documentation

## 2011-12-27 DIAGNOSIS — Z79899 Other long term (current) drug therapy: Secondary | ICD-10-CM | POA: Insufficient documentation

## 2011-12-27 DIAGNOSIS — G43909 Migraine, unspecified, not intractable, without status migrainosus: Secondary | ICD-10-CM | POA: Insufficient documentation

## 2011-12-27 DIAGNOSIS — E669 Obesity, unspecified: Secondary | ICD-10-CM | POA: Insufficient documentation

## 2011-12-27 DIAGNOSIS — Z86718 Personal history of other venous thrombosis and embolism: Secondary | ICD-10-CM | POA: Insufficient documentation

## 2011-12-27 DIAGNOSIS — Z8673 Personal history of transient ischemic attack (TIA), and cerebral infarction without residual deficits: Secondary | ICD-10-CM | POA: Insufficient documentation

## 2011-12-28 MED ORDER — DEXAMETHASONE SODIUM PHOSPHATE 10 MG/ML IJ SOLN
10.0000 mg | Freq: Once | INTRAMUSCULAR | Status: AC
Start: 2011-12-28 — End: 2011-12-28
  Administered 2011-12-28: 10 mg via INTRAVENOUS
  Filled 2011-12-28: qty 1

## 2011-12-28 MED ORDER — METOCLOPRAMIDE HCL 5 MG/ML IJ SOLN
10.0000 mg | Freq: Once | INTRAMUSCULAR | Status: AC
Start: 2011-12-28 — End: 2011-12-28
  Administered 2011-12-28: 10 mg via INTRAVENOUS
  Filled 2011-12-28: qty 2

## 2011-12-28 MED ORDER — DIPHENHYDRAMINE HCL 50 MG/ML IJ SOLN
25.0000 mg | Freq: Once | INTRAMUSCULAR | Status: AC
  Administered 2011-12-28: 25 mg via INTRAVENOUS
  Filled 2011-12-28: qty 1

## 2011-12-28 MED ORDER — SODIUM CHLORIDE 0.9 % IV BOLUS (SEPSIS)
1000.0000 mL | Freq: Once | INTRAVENOUS | Status: AC
  Administered 2011-12-28: 1000 mL via INTRAVENOUS

## 2011-12-28 NOTE — ED Provider Notes (Signed)
History   Scribed for Hanley Seamen, MD, the patient was seen in room MH10/MH10 . This chart was scribed by Lewanda Rife.   CSN: [id number]  Arrival date & time 12/27/11  2238   First MD Initiated Contact with Patient 12/28/11 0047      Chief Complaint  Patient presents with  . Migraine    (Consider location/radiation/quality/duration/timing/severity/associated sxs/prior treatment) HPI Terri Boyd is a 41 y.o. female who presents to the Emergency Department complaining of a moderate to severe left-sided headache for the past 7 hours with radiation down left leg. Pt describes headache as throbbing and slowly improving at this time. Pt states she's had 2 similar headaches since last ED visit. Pt reports associated photophobia. Pt additionally complains of mild waxing and waning LLQ abdominal pain with a "fluttering" feeling. Pt denies nausea, diarrhea, vomiting, and any urinary symptoms. Pt states she has a hx of migraines. Pt denies taking anything to treat symptoms prior to arrival.  Past Medical History  Diagnosis Date  . Obesity   . Stroke   . DVT (deep venous thrombosis)   . High cholesterol   . Migraine     Past Surgical History  Procedure Date  . Tubal ligation     History reviewed. No pertinent family history.  History  Substance Use Topics  . Smoking status: Former Games developer  . Smokeless tobacco: Not on file  . Alcohol Use: No    OB History    Grav Para Term Preterm Abortions TAB SAB Ect Mult Living                  Review of Systems Neurological: Headache GI: Abdominal pain  Eyes: Photophobia  A complete 10 system review of systems was obtained and all systems are negative except as noted in the HPI and PMH.   Allergies  Review of patient's allergies indicates no known allergies.  Home Medications   Current Outpatient Rx  Name Route Sig Dispense Refill  . IBUPROFEN 200 MG PO TABS Oral Take 800 mg by mouth every 6 (six) hours as needed.  Patient used this medication for her headache.    Marland Kitchen ENOXAPARIN SODIUM 150 MG/ML [place] SOLN Subcutaneous Inject 145 mg into the skin every 12 (twelve) hours.      Marland Kitchen SIMVASTATIN 20 MG PO TABS Oral Take 20 mg by mouth at bedtime.      . WARFARIN SODIUM 10 MG PO TABS Oral Take 10 mg by mouth daily.        BP 169/96  Pulse 79  Temp(Src) 98 F (36.7 C) (Oral)  Resp 16  Ht 5\' 4"  (1.626 m)  Wt 315 lb (142.883 kg)  BMI 54.07 kg/m2  SpO2 100%  LMP 12/11/2011  Physical Exam  Nursing note and vitals reviewed. Constitutional: She is oriented to person, place, and time. She appears well-developed and well-nourished.  HENT:  Head: Normocephalic and atraumatic.  Eyes: Conjunctivae are normal. Pupils are equal, round, and reactive to light.  Neck: Normal range of motion. Neck supple. No tracheal deviation present. No Brudzinski's sign and no Kernig's sign noted. No thyromegaly present.  Cardiovascular: Normal rate and regular rhythm.   No murmur heard. Pulmonary/Chest: Effort normal and breath sounds normal.  Abdominal: Soft. Bowel sounds are normal. She exhibits no distension. There is no tenderness. There is no rebound and no guarding.  Musculoskeletal: Normal range of motion. She exhibits no edema and no tenderness.  Neurological: She is alert and oriented to person,  place, and time. Coordination normal.       Normal romberg, normal finger to nose, and normal 5/5 motor  Skin: Skin is warm and dry. No rash noted.  Psychiatric: She has a normal mood and affect.    ED Course  Procedures (including critical care time)    MDM  2:16 AM Feels better. Sleeping comfortably after IV medicines.      I personally performed the services described in this documentation, which was scribed in my presence.  The recorded information has been reviewed and considered.    Hanley Seamen, MD 12/28/11 [phone]

## 2011-12-28 NOTE — Discharge Instructions (Signed)

## 2012-04-10 ENCOUNTER — Encounter (HOSPITAL_BASED_OUTPATIENT_CLINIC_OR_DEPARTMENT_OTHER): Payer: Self-pay | Admitting: *Deleted

## 2012-04-10 ENCOUNTER — Emergency Department (HOSPITAL_BASED_OUTPATIENT_CLINIC_OR_DEPARTMENT_OTHER): Payer: Medicaid Other

## 2012-04-10 ENCOUNTER — Emergency Department (HOSPITAL_BASED_OUTPATIENT_CLINIC_OR_DEPARTMENT_OTHER)
Admission: EM | Admit: 2012-04-10 | Discharge: 2012-04-11 | Disposition: A | Discharge status: Home / Self Care | Payer: Medicaid Other | Attending: Emergency Medicine | Admitting: Emergency Medicine

## 2012-04-10 DIAGNOSIS — R609 Edema, unspecified: Secondary | ICD-10-CM | POA: Insufficient documentation

## 2012-04-10 DIAGNOSIS — M79609 Pain in unspecified limb: Secondary | ICD-10-CM | POA: Insufficient documentation

## 2012-04-10 DIAGNOSIS — Z7901 Long term (current) use of anticoagulants: Secondary | ICD-10-CM | POA: Insufficient documentation

## 2012-04-10 DIAGNOSIS — Z86718 Personal history of other venous thrombosis and embolism: Secondary | ICD-10-CM | POA: Insufficient documentation

## 2012-04-10 DIAGNOSIS — M7989 Other specified soft tissue disorders: Secondary | ICD-10-CM | POA: Insufficient documentation

## 2012-04-10 LAB — COMPREHENSIVE METABOLIC PANEL
ALT: 14 U/L (ref 0–35)
AST: 15 U/L (ref 0–37)
Albumin: 3.3 g/dL — ABNORMAL LOW (ref 3.5–5.2)
Alkaline Phosphatase: 103 U/L (ref 39–117)
BUN: 15 mg/dL (ref 6–23)
CO2: 24 mEq/L (ref 19–32)
Calcium: 9.4 mg/dL (ref 8.4–10.5)
Chloride: 104 mEq/L (ref 96–112)
Creatinine, Ser: 1 mg/dL (ref 0.50–1.10)
GFR calc Af Amer: 81 mL/min — ABNORMAL LOW (ref >90)
GFR calc non Af Amer: 69 mL/min — ABNORMAL LOW (ref >90)
Glucose, Bld: 108 mg/dL — ABNORMAL HIGH (ref 70–99)
Potassium: 3.4 mEq/L — ABNORMAL LOW (ref 3.5–5.1)
Sodium: 138 mEq/L (ref 135–145)
Total Bilirubin: 0.2 mg/dL — ABNORMAL LOW (ref 0.3–1.2)
Total Protein: 7.2 g/dL (ref 6.0–8.3)

## 2012-04-10 LAB — URINE MICROSCOPIC-ADD ON

## 2012-04-10 LAB — URINALYSIS, ROUTINE W REFLEX MICROSCOPIC
Bilirubin Urine: NEGATIVE
Glucose, UA: NEGATIVE mg/dL
Hgb urine dipstick: NEGATIVE
Ketones, ur: NEGATIVE mg/dL
Nitrite: NEGATIVE
Protein, ur: NEGATIVE mg/dL
Specific Gravity, Urine: 1.034 — ABNORMAL HIGH (ref 1.005–1.030)
Urobilinogen, UA: 0.2 mg/dL (ref 0.0–1.0)
pH: 5.5 (ref 5.0–8.0)

## 2012-04-10 LAB — PREGNANCY, URINE: Preg Test, Ur: NEGATIVE

## 2012-04-10 LAB — PROTIME-INR
INR: 0.97 (ref 0.00–1.49)
Prothrombin Time: 13.1 seconds (ref 11.6–15.2)

## 2012-04-10 MED ORDER — POTASSIUM CHLORIDE CRYS ER 20 MEQ PO TBCR
40.0000 meq | EXTENDED_RELEASE_TABLET | Freq: Once | ORAL | Status: AC
  Administered 2012-04-10: 40 meq via ORAL
  Filled 2012-04-10: qty 2

## 2012-04-10 MED ORDER — HYDROCHLOROTHIAZIDE 25 MG PO TABS: 25.0000 mg | ORAL_TABLET | Freq: Every day | ORAL | Status: DC

## 2012-04-10 MED ORDER — FUROSEMIDE 20 MG PO TABS
20.0000 mg | ORAL_TABLET | Freq: Once | ORAL | Status: AC
Start: 2012-04-10 — End: 2012-04-10
  Administered 2012-04-10: 20 mg via ORAL
  Filled 2012-04-10: qty 1

## 2012-04-10 NOTE — ED Notes (Signed)
Pt states her legs have been swelling for several days. Also c/o H/A for a few hours. PERL

## 2012-04-10 NOTE — ED Provider Notes (Signed)
History   This chart was scribed for Terri Razor, MD by Shari Heritage. The patient was seen in room MH09/MH09. Patient's care was started at 2032.     CSN: [id number]  Arrival date & time 04/10/12  2032   First MD Initiated Contact with Patient 04/10/12 2045      Chief Complaint  Patient presents with  . Leg Swelling    (Consider location/radiation/quality/duration/timing/severity/associated sxs/prior treatment) Patient is a 41 y.o. female presenting with leg pain. The history is provided by the patient. No language interpreter was used.  Leg Pain  The incident occurred more than 1 week ago. The incident occurred at home. There was no injury mechanism. The pain location is generalized. The pain is moderate. Pertinent negatives include no numbness, no inability to bear weight, no loss of motion, no muscle weakness, no loss of sensation and no tingling.    Terri Boyd is a 41 y.o. female with a history of TIA and DVT who presents to the Emergency Department complaining of intermittent, moderate bilateral leg pain and swelling onset 1 week ago. Patient says that ambulation has been difficult since the onset of the symptoms. Patient also developed a HA about 8 hours ago. Patient denies any urinary symptoms or SOB. Patient denies any history of leg pain and swelling.  Patient also denies history of kidney or liver problems. Patient has had occasional high blood pressure, but isn't treated for a chronic condition. Patient has a h/o migraine and high cholesterol. Surgical h/o includes tubal ligation. Patient used to take Coumadin, but she hasn't taken it for several months. Patient does not have a PCP. She is a former smoker. Patient has no known allergies.   Past Medical History  Diagnosis Date  . Obesity   . Stroke   . DVT (deep venous thrombosis)   . High cholesterol   . Migraine     Past Surgical History  Procedure Date  . Tubal ligation     History reviewed. No pertinent  family history.  History  Substance Use Topics  . Smoking status: Former Games developer  . Smokeless tobacco: Not on file  . Alcohol Use: No    OB History    Grav Para Term Preterm Abortions TAB SAB Ect Mult Living                  Review of Systems  Respiratory: Negative for shortness of breath.   Genitourinary: Negative for dysuria, urgency, frequency, decreased urine volume and difficulty urinating.  Neurological: Negative for tingling and numbness.  All other systems reviewed and are negative.    Allergies  Review of patient's allergies indicates no known allergies.  Home Medications   Current Outpatient Rx  Name Route Sig Dispense Refill  . ENOXAPARIN SODIUM 150 MG/ML [place] SOLN Subcutaneous Inject 145 mg into the skin every 12 (twelve) hours.      . IBUPROFEN 200 MG PO TABS Oral Take 800 mg by mouth every 6 (six) hours as needed. Patient used this medication for her headache.    Marland Kitchen SIMVASTATIN 20 MG PO TABS Oral Take 20 mg by mouth at bedtime.      . WARFARIN SODIUM 10 MG PO TABS Oral Take 10 mg by mouth daily.        BP 166/72  Pulse 88  Temp 98.2 F (36.8 C) (Oral)  Resp 20  Ht 5\' 4"  (1.626 m)  Wt 320 lb (145.151 kg)  BMI 54.93 kg/m2  SpO2 100%  LMP  03/14/2012  Physical Exam  Nursing note and vitals reviewed. Constitutional: She appears well-developed and well-nourished. No distress.  HENT:  Head: Normocephalic and atraumatic.  Eyes: Conjunctivae are normal. Right eye exhibits no discharge. Left eye exhibits no discharge.  Neck: Neck supple.  Cardiovascular: Normal rate, regular rhythm and normal heart sounds.  Exam reveals no gallop and no friction rub.   No murmur heard. Pulmonary/Chest: Effort normal and breath sounds normal. No respiratory distress.  Abdominal: Soft. She exhibits no distension. There is no tenderness.  Musculoskeletal: She exhibits edema. She exhibits no tenderness.       Symmetric pitting edema in BLE. Left calf tenderness. No palpable  cords. Negative homans.  Neurological: She is alert.  Skin: Skin is warm and dry.  Psychiatric: She has a normal mood and affect. Her behavior is normal. Thought content normal.    ED Course  Procedures (including critical care time) DIAGNOSTIC STUDIES: Oxygen Saturation is 100% on room air, normal by my interpretation. BP at 8:39pm- 166/72  COORDINATION OF CARE: 9:27pm- Patient informed of current plan for treatment and evaluation and agrees with plan at this time. Have ordered bilateral US of legs, protime-INR, comprehensive metabolic panel, urinalysis, and microscopic urine test.   Results for orders placed during the hospital encounter of 04/10/12  URINALYSIS, ROUTINE W REFLEX MICROSCOPIC      Component Value Range   Color, Urine YELLOW  YELLOW   APPearance CLEAR  CLEAR   Specific Gravity, Urine 1.034 (*) 1.005 - 1.030   pH 5.5  5.0 - 8.0   Glucose, UA NEGATIVE  NEGATIVE mg/dL   Hgb urine dipstick NEGATIVE  NEGATIVE   Bilirubin Urine NEGATIVE  NEGATIVE   Ketones, ur NEGATIVE  NEGATIVE mg/dL   Protein, ur NEGATIVE  NEGATIVE mg/dL   Urobilinogen, UA 0.2  0.0 - 1.0 mg/dL   Nitrite NEGATIVE  NEGATIVE   Leukocytes, UA TRACE (*) NEGATIVE  PREGNANCY, URINE      Component Value Range   Preg Test, Ur NEGATIVE  NEGATIVE  PROTIME-INR      Component Value Range   Prothrombin Time 13.1  11.6 - 15.2 seconds   INR 0.97  0.00 - 1.49  COMPREHENSIVE METABOLIC PANEL      Component Value Range   Sodium 138  135 - 145 mEq/L   Potassium 3.4 (*) 3.5 - 5.1 mEq/L   Chloride 104  96 - 112 mEq/L   CO2 24  19 - 32 mEq/L   Glucose, Bld 108 (*) 70 - 99 mg/dL   BUN 15  6 - 23 mg/dL   Creatinine, Ser 4.09  0.50 - 1.10 mg/dL   Calcium 9.4  8.4 - 81.1 mg/dL   Total Protein 7.2  6.0 - 8.3 g/dL   Albumin 3.3 (*) 3.5 - 5.2 g/dL   AST 15  0 - 37 U/L   ALT 14  0 - 35 U/L   Alkaline Phosphatase 103  39 - 117 U/L   Total Bilirubin 0.2 (*) 0.3 - 1.2 mg/dL   GFR calc non Af Amer 69 (*) >90 mL/min    GFR calc Af Amer 81 (*) >90 mL/min  URINE MICROSCOPIC-ADD ON      Component Value Range   Squamous Epithelial / LPF FEW (*) RARE   WBC, UA 3-6  <3 WBC/hpf   RBC / HPF 0-2  <3 RBC/hpf   Bacteria, UA RARE  RARE   Crystals CA OXALATE CRYSTALS (*) NEGATIVE   Urine-Other MUCOUS PRESENT  US Venous Img Lower Bilateral  04/10/2012  *RADIOLOGY REPORT*  Clinical Data:  LEG SWELLING  BILATERAL LOWER EXTREMITY VENOUS DUPLEX ULTRASOUND  Technique: Gray-scale sonography with compression, as well as color and duplex ultrasound, were performed to evaluate the deep venous system from the level of the common femoral vein through the popliteal and proximal calf veins.  Comparison: None  Findings:  Normal compressibility and normal Doppler signal within the common femoral and proximal superficial femoral veins.  No grayscale filling defects to suggest DVT. Note is made of a duplicated system on the right involving the superficial femoral vein.  The mid and distal superficial femoral vein and popliteal vein are poorly visualized on the right. The mid and distal superficial femoral vein on the left are poorly visualized. Nondiagnostic evaluation of the calf veins bilaterally.  IMPRESSION:  No evidence of bilateral lower extremity deep vein thrombosis within the visualized veins as above. There are portions of the bilateral superficial femoral veins and the right popliteal vein that are suboptimally visualized. Therefore, consider a short-term follow-up examination if clinical concern persists.  Original Report Authenticated By: Waneta Martins, M.D.     1. Leg swelling       MDM  41 year old female with leg swelling. To some negative for blood clot. Renal function is fine. No history of hepatic dysfunction and no evidence of this on exam today. Suspect venous insufficiency.Dose lasix. Outpt fu for further eval.        I personally preformed the services scribed in my presence. The recorded information  has been reviewed and considered. Terri Razor, MD.    Terri Razor, MD 04/15/12 1309

## 2012-04-10 NOTE — ED Notes (Signed)
Pt. Is in no distress and has no complaints at present time.

## 2012-04-10 NOTE — ED Notes (Signed)
Pt was triaged by Terri Boyd under Terri Boyd's name.

## 2012-04-17 ENCOUNTER — Encounter (HOSPITAL_BASED_OUTPATIENT_CLINIC_OR_DEPARTMENT_OTHER): Payer: Self-pay | Admitting: *Deleted

## 2012-04-17 ENCOUNTER — Emergency Department (HOSPITAL_BASED_OUTPATIENT_CLINIC_OR_DEPARTMENT_OTHER)
Admission: EM | Admit: 2012-04-17 | Discharge: 2012-04-18 | Disposition: A | Discharge status: Home / Self Care | Payer: Self-pay | Attending: Emergency Medicine | Admitting: Emergency Medicine

## 2012-04-17 DIAGNOSIS — E7800 Pure hypercholesterolemia, unspecified: Secondary | ICD-10-CM | POA: Insufficient documentation

## 2012-04-17 DIAGNOSIS — Z87891 Personal history of nicotine dependence: Secondary | ICD-10-CM | POA: Insufficient documentation

## 2012-04-17 DIAGNOSIS — I10 Essential (primary) hypertension: Secondary | ICD-10-CM | POA: Insufficient documentation

## 2012-04-17 DIAGNOSIS — Z86718 Personal history of other venous thrombosis and embolism: Secondary | ICD-10-CM | POA: Insufficient documentation

## 2012-04-17 DIAGNOSIS — M79605 Pain in left leg: Secondary | ICD-10-CM

## 2012-04-17 DIAGNOSIS — M7989 Other specified soft tissue disorders: Secondary | ICD-10-CM | POA: Insufficient documentation

## 2012-04-17 DIAGNOSIS — Z8673 Personal history of transient ischemic attack (TIA), and cerebral infarction without residual deficits: Secondary | ICD-10-CM | POA: Insufficient documentation

## 2012-04-17 DIAGNOSIS — E669 Obesity, unspecified: Secondary | ICD-10-CM | POA: Insufficient documentation

## 2012-04-17 HISTORY — DX: Essential (primary) hypertension: I10

## 2012-04-17 NOTE — ED Notes (Signed)
Pt c/o left pain since 8pm- denies known injury

## 2012-04-17 NOTE — ED Provider Notes (Signed)
History   This chart was scribed for Terri Nielsen, MD by Shari Heritage. The patient was seen in room MH09/MH09. Patient's care was started at 2321.     CSN: [id number]  Arrival date & time 04/17/12  2321   First MD Initiated Contact with Patient 04/17/12 2323      No chief complaint on file.   (Consider location/radiation/quality/duration/timing/severity/associated sxs/prior treatment) Patient is a 41 y.o. female presenting with leg pain. The history is provided by the patient. No language interpreter was used.  Leg Pain  The incident occurred 3 to 5 hours ago. The incident occurred at home. There was no injury mechanism. The pain is present in the left leg. The pain is moderate. The pain has been constant since onset. Pertinent negatives include no numbness and no muscle weakness. She reports no foreign bodies present. The symptoms are aggravated by activity. She has tried heat and elevation for the symptoms. The treatment provided no relief.    Terri Boyd is a 41 y.o. female who presents to the Emergency Department complaining of left leg pain around the calf with associated swelling onset 3.5 hours ago. Patient denies any obvious injury or trauma. There is no redness, weakness or numbness. Patient denies SOB, chest pain, fever or chills. Patient says that she has tried elevating her leg and applied heat to her leg with no relief. Patient has a history of DVT (July 2012). Patient took Coumadin for several months after she was diagnosed, but she is no longer on Coumadin. Patient also has a medical history of stroke, high cholesterol, migraine and HTN. She is a former smoker.  PCP - None    Past Medical History  Diagnosis Date  . Obesity   . Stroke   . DVT (deep venous thrombosis)   . High cholesterol   . Migraine   . Hypertension     Past Surgical History  Procedure Date  . Tubal ligation     No family history on file.  History  Substance Use Topics  . Smoking status:  Former Games developer  . Smokeless tobacco: Not on file  . Alcohol Use: No    OB History    Grav Para Term Preterm Abortions TAB SAB Ect Mult Living                  Review of Systems  Constitutional: Negative for fever and chills.  Respiratory: Negative for shortness of breath.   Cardiovascular: Positive for leg swelling. Negative for chest pain.  Skin: Negative for color change.  Neurological: Negative for weakness and numbness.  All other systems reviewed and are negative.    Allergies  Review of patient's allergies indicates no known allergies.  Home Medications   Current Outpatient Rx  Name Route Sig Dispense Refill  . ASPIRIN-ACETAMINOPHEN 500-325 MG PO PACK Oral Take 1 packet by mouth every 6 (six) hours as needed. For headache    . HYDROCHLOROTHIAZIDE 25 MG PO TABS Oral Take 1 tablet (25 mg total) by mouth daily. 30 tablet 1  . IBUPROFEN 200 MG PO TABS Oral Take 800 mg by mouth every 6 (six) hours as needed. For pain      LMP 03/14/2012  Physical Exam  Cardiovascular: Normal rate and regular rhythm.   Pulmonary/Chest: Effort normal and breath sounds normal. No respiratory distress.  Abdominal: Soft. There is no tenderness.  Musculoskeletal:       Left-sided calf tenderness. No erythema. Associated swelling.    ED Course  Procedures (including critical care time) DIAGNOSTIC STUDIES: Oxygen Saturation is 100% on room air, normal by my interpretation.    COORDINATION OF CARE: 11:33pm- Patient informed of current plan for treatment and evaluation and agrees with plan at this time.    Results for orders placed during the hospital encounter of 04/17/12  BASIC METABOLIC PANEL      Component Value Range   Sodium 139  135 - 145 mEq/L   Potassium 3.3 (*) 3.5 - 5.1 mEq/L   Chloride 103  96 - 112 mEq/L   CO2 27  19 - 32 mEq/L   Glucose, Bld 109 (*) 70 - 99 mg/dL   BUN 13  6 - 23 mg/dL   Creatinine, Ser 2.44  0.50 - 1.10 mg/dL   Calcium 9.3  8.4 - 01.0 mg/dL   GFR  calc non Af Amer 79 (*) >90 mL/min   GFR calc Af Amer >90  >90 mL/min    Left lower extremity swelling and pain with history of DVT no longer on Coumadin. No PE symptoms. No cellulitis. Labs reviewed as above. Potassium for hypokalemia. Normal creatinine.  Lovenox provided.  Patient scheduled for DVT ultrasound in the morning to evaluate left lower extremity pain and swelling.  MDM   Nursing notes reviewed. Vital signs reviewed. Old records reviewed. Labs obtained and reviewed as above.   I personally performed the services described in this documentation, which was scribed in my presence. The recorded information has been reviewed and considered.    Terri Nielsen, MD 04/18/12 [phone]

## 2012-04-18 ENCOUNTER — Encounter (HOSPITAL_BASED_OUTPATIENT_CLINIC_OR_DEPARTMENT_OTHER): Payer: Self-pay | Admitting: Emergency Medicine

## 2012-04-18 ENCOUNTER — Emergency Department (HOSPITAL_BASED_OUTPATIENT_CLINIC_OR_DEPARTMENT_OTHER)
Admission: EM | Admit: 2012-04-18 | Discharge: 2012-04-19 | Disposition: A | Discharge status: Home / Self Care | Payer: Self-pay | Attending: Emergency Medicine | Admitting: Emergency Medicine

## 2012-04-18 ENCOUNTER — Ambulatory Visit (HOSPITAL_BASED_OUTPATIENT_CLINIC_OR_DEPARTMENT_OTHER)
Admit: 2012-04-18 | Discharge: 2012-04-18 | Disposition: A | Discharge status: Home / Self Care | Payer: Self-pay | Attending: Emergency Medicine | Admitting: Emergency Medicine

## 2012-04-18 ENCOUNTER — Emergency Department (HOSPITAL_BASED_OUTPATIENT_CLINIC_OR_DEPARTMENT_OTHER)
Admission: EM | Admit: 2012-04-18 | Discharge: 2012-04-18 | Disposition: A | Discharge status: Home / Self Care | Payer: Self-pay | Attending: Emergency Medicine | Admitting: Emergency Medicine

## 2012-04-18 ENCOUNTER — Emergency Department (HOSPITAL_BASED_OUTPATIENT_CLINIC_OR_DEPARTMENT_OTHER): Payer: Self-pay

## 2012-04-18 DIAGNOSIS — I10 Essential (primary) hypertension: Secondary | ICD-10-CM | POA: Insufficient documentation

## 2012-04-18 DIAGNOSIS — Z79899 Other long term (current) drug therapy: Secondary | ICD-10-CM | POA: Insufficient documentation

## 2012-04-18 DIAGNOSIS — M1710 Unilateral primary osteoarthritis, unspecified knee: Secondary | ICD-10-CM | POA: Insufficient documentation

## 2012-04-18 DIAGNOSIS — M79609 Pain in unspecified limb: Secondary | ICD-10-CM | POA: Insufficient documentation

## 2012-04-18 DIAGNOSIS — Z86718 Personal history of other venous thrombosis and embolism: Secondary | ICD-10-CM | POA: Insufficient documentation

## 2012-04-18 DIAGNOSIS — M25569 Pain in unspecified knee: Secondary | ICD-10-CM | POA: Insufficient documentation

## 2012-04-18 DIAGNOSIS — Z8673 Personal history of transient ischemic attack (TIA), and cerebral infarction without residual deficits: Secondary | ICD-10-CM | POA: Insufficient documentation

## 2012-04-18 DIAGNOSIS — Z87891 Personal history of nicotine dependence: Secondary | ICD-10-CM | POA: Insufficient documentation

## 2012-04-18 DIAGNOSIS — E7800 Pure hypercholesterolemia, unspecified: Secondary | ICD-10-CM | POA: Insufficient documentation

## 2012-04-18 LAB — BASIC METABOLIC PANEL
BUN: 13 mg/dL (ref 6–23)
CO2: 27 mEq/L (ref 19–32)
Calcium: 9.3 mg/dL (ref 8.4–10.5)
GFR calc non Af Amer: 79 mL/min — ABNORMAL LOW (ref >90)
Glucose, Bld: 109 mg/dL — ABNORMAL HIGH (ref 70–99)

## 2012-04-18 LAB — POCT I-STAT, CHEM 8
Calcium, Ion: 1.2 mmol/L (ref 1.12–1.23)
Chloride: 103 mEq/L (ref 96–112)
Glucose, Bld: 86 mg/dL (ref 70–99)
HCT: 34 % — ABNORMAL LOW (ref 36.0–46.0)
Hemoglobin: 11.6 g/dL — ABNORMAL LOW (ref 12.0–15.0)
TCO2: 25 mmol/L (ref 0–100)

## 2012-04-18 LAB — CBC WITH DIFFERENTIAL/PLATELET
Basophils Relative: 0 % (ref 0–1)
Eosinophils Absolute: 0.1 10*3/uL (ref 0.0–0.7)
Eosinophils Relative: 2 % (ref 0–5)
MCH: 26 pg (ref 26.0–34.0)
MCHC: 33 g/dL (ref 30.0–36.0)
MCV: 78.8 fL (ref 78.0–100.0)
Monocytes Relative: 11 % (ref 3–12)
Neutrophils Relative %: 30 % — ABNORMAL LOW (ref 43–77)
Platelets: 317 10*3/uL (ref 150–400)

## 2012-04-18 MED ORDER — HYDROMORPHONE HCL PF 1 MG/ML IJ SOLN
INTRAMUSCULAR | Status: AC
  Administered 2012-04-18: 1 mg via INTRAMUSCULAR
  Filled 2012-04-18: qty 1

## 2012-04-18 MED ORDER — HYDROMORPHONE HCL PF 1 MG/ML IJ SOLN
1.0000 mg | Freq: Once | INTRAMUSCULAR | Status: AC
  Administered 2012-04-18: 1 mg via INTRAMUSCULAR

## 2012-04-18 MED ORDER — ENOXAPARIN SODIUM 150 MG/ML ~~LOC~~ SOLN
1.0000 mg/kg | Freq: Once | SUBCUTANEOUS | Status: AC
  Administered 2012-04-18: 145 mg via SUBCUTANEOUS
  Filled 2012-04-18: qty 1

## 2012-04-18 MED ORDER — KETOROLAC TROMETHAMINE 30 MG/ML IJ SOLN
30.0000 mg | Freq: Once | INTRAMUSCULAR | Status: AC
  Administered 2012-04-18: 30 mg via INTRAMUSCULAR
  Filled 2012-04-18: qty 1

## 2012-04-18 MED ORDER — POTASSIUM CHLORIDE CRYS ER 20 MEQ PO TBCR
20.0000 meq | EXTENDED_RELEASE_TABLET | Freq: Once | ORAL | Status: AC
  Administered 2012-04-18: 20 meq via ORAL
  Filled 2012-04-18: qty 1

## 2012-04-18 MED ORDER — KETOROLAC TROMETHAMINE 60 MG/2ML IM SOLN
60.0000 mg | Freq: Once | INTRAMUSCULAR | Status: AC
  Administered 2012-04-18: 60 mg via INTRAMUSCULAR
  Filled 2012-04-18: qty 2

## 2012-04-18 MED ORDER — TRAMADOL HCL 50 MG PO TABS: 50.0000 mg | ORAL_TABLET | Freq: Four times a day (QID) | ORAL | Status: AC | PRN

## 2012-04-18 MED ORDER — NAPROXEN 375 MG PO TABS: 375.0000 mg | ORAL_TABLET | Freq: Two times a day (BID) | ORAL | Status: DC

## 2012-04-18 NOTE — ED Provider Notes (Signed)
History     CSN: [id number]  Arrival date & time 04/18/12  1020   First MD Initiated Contact with Patient 04/18/12 1048      Chief Complaint  Patient presents with  . Leg Pain  . Headache    (Consider location/radiation/quality/duration/timing/severity/associated sxs/prior treatment) HPI This morbidly abuse female presents with new left lower extremity pain.,  The patient was seen here yesterday for this complaint.  She notes that since that presentation she continues to have pain and swelling in the inferior of her left knee.  The pain is otherwise nonradiating, sore, worse with motion, not improved with ibuprofen.  She denies any history of trauma, falls. The patient has a history of DVT in the distant past. Past Medical History  Diagnosis Date  . Obesity   . Stroke   . DVT (deep venous thrombosis)   . High cholesterol   . Migraine   . Hypertension     Past Surgical History  Procedure Date  . Tubal ligation     History reviewed. No pertinent family history.  History  Substance Use Topics  . Smoking status: Former Games developer  . Smokeless tobacco: Never Used  . Alcohol Use: No    OB History    Grav Para Term Preterm Abortions TAB SAB Ect Mult Living                  Review of Systems  All other systems reviewed and are negative.    Allergies  Review of patient's allergies indicates no known allergies.  Home Medications   Current Outpatient Rx  Name Route Sig Dispense Refill  . ASPIRIN-ACETAMINOPHEN 500-325 MG PO PACK Oral Take 1 packet by mouth every 6 (six) hours as needed. For headache    . HYDROCHLOROTHIAZIDE 25 MG PO TABS Oral Take 1 tablet (25 mg total) by mouth daily. 30 tablet 1  . IBUPROFEN 200 MG PO TABS Oral Take 800 mg by mouth every 6 (six) hours as needed. For pain      BP 143/77  Pulse 80  Temp 97.9 F (36.6 C) (Oral)  Resp 20  SpO2 99%  LMP 04/15/2012  Physical Exam  Nursing note and vitals reviewed. Constitutional: She is oriented  to person, place, and time. She appears well-developed and well-nourished. No distress.       Morbidly obese female, resting in bed,  HENT:  Head: Normocephalic and atraumatic.  Eyes: Conjunctivae and EOM are normal.  Cardiovascular: Normal rate and regular rhythm.   Pulmonary/Chest: Effort normal and breath sounds normal. No stridor. No respiratory distress.  Abdominal: She exhibits no distension.  Musculoskeletal: She exhibits no edema.       About the inferior aspect of the left knee there is minimal edema, mild tenderness to palpation.  The patient can flex and extend the knee appropriately.  There is no superficial changes.  Distal pulses are appropriate.  Neurological: She is alert and oriented to person, place, and time. No cranial nerve deficit.  Skin: Skin is warm and dry.  Psychiatric: She has a normal mood and affect.    ED Course  Procedures (including critical care time)  Labs Reviewed - No data to display US Venous Img Lower Unilateral Left  04/18/2012  *RADIOLOGY REPORT*  Clinical Data: Pain distal to the knee.  LEFT LOWER EXTREMITY VENOUS DOPPLER ULTRASOUND  Technique: Gray-scale sonography with compression, as well as color and duplex ultrasound, were performed to evaluate the deep venous system from the level of the  common femoral vein through the popliteal and proximal calf veins.  Comparison: 04/10/2012  Findings:  Normal compressibility of  the common femoral, superficial femoral, and popliteal veins, as well as the proximal calf veins.  No filling defects to suggest DVT on grayscale or color Doppler imaging.  Doppler waveforms show normal direction of venous flow, normal respiratory phasicity and response to augmentation. No Baker's cyst seen.  IMPRESSION: No evidence of  lower extremity deep vein thrombosis.  Original Report Authenticated By: Osa Craver, M.D.     No diagnosis found.    MDM  This patient presents for second time in 2 days with persistent  pain about the left lower extremity.  Specifically, the patient has pain about the medial inferior aspect of the knee.  Yesterday's negative ultrasound is unremarkable x-ray and unremarkable labs are all reassuring for the low suspicion of acute limb threatening, life-threatening illness.  I discussed all findings with the patient and her daughter.  The patient was discharged with analgesics, orthopedics followup  Gerhard Munch, MD 04/18/12 1222

## 2012-04-18 NOTE — ED Notes (Signed)
Reports pain in left leg pain since Sunday.  Was seen here on Sunday for same and brought back for [place] to rule out clot.  [place] was negative but she states pain has not gotten any better.

## 2012-04-18 NOTE — ED Notes (Signed)
Pt stated that she started having pain and swelling in her left leg. She had a doppler done yesterday and they did not find anything. Today she continues to have pain and swelling on her leg below her knee. Pt stated that she also has a headache. Ibuprofen was used by pt with no relief of pain. Pt stated that she had no injury that could have caused the pain.

## 2012-04-18 NOTE — ED Provider Notes (Signed)
History  This chart was scribed for Terri Ines Smitty Cords, MD by Terri Boyd. This patient was seen in room MH05/MH05 and the patient's care was started at 23:23.   CSN: [id number]  Arrival date & time 04/18/12  2312   First MD Initiated Contact with Patient 04/18/12 2323      Chief Complaint  Patient presents with  . Leg Pain    (Consider location/radiation/quality/duration/timing/severity/associated sxs/prior Treatment)  Terri Boyd is a 41 y.o. female who presents to the Emergency Department complaining of left leg pain, around the knee and just below, since Sunday (yesterday). Pt has been seen here 3 times within the last 24 hours, all for the same complaint. She had 2 sets of labs, an x-ray, and an ultrasound that were all normal. Pt was also seen here on the 28th of last month for the same complaint. Pt has been taking Tramadol for the pain. Pt's LNMP was Friday. Pt has no insurance.  Patient is a 41 y.o. female presenting with knee pain. The history is provided by the patient. No language interpreter was used.  Knee Pain This is a recurrent problem. The current episode started more than 1 week ago (she said yesterday but was seen for same a week ago). The problem occurs constantly. The problem has not changed since onset.Pertinent negatives include no chest pain, no abdominal pain and no headaches. Nothing aggravates the symptoms. Nothing relieves the symptoms. Treatments tried: Tramadol. The treatment provided no relief.       Past Medical History  Diagnosis Date  . Obesity   . Stroke   . DVT (deep venous thrombosis)   . High cholesterol   . Migraine   . Hypertension     Past Surgical History  Procedure Date  . Tubal ligation     No family history on file.  History  Substance Use Topics  . Smoking status: Former Games developer  . Smokeless tobacco: Never Used  . Alcohol Use: No    OB History    Grav Para Term Preterm Abortions TAB SAB Ect Mult Living         Review of Systems  Constitutional: Negative for fever.  HENT: Negative for congestion.   Eyes: Negative for discharge.  Respiratory: Negative for cough.   Cardiovascular: Negative for chest pain.  Gastrointestinal: Negative for abdominal pain and diarrhea.  Genitourinary: Negative for dysuria.  Musculoskeletal: Negative for back pain.       Right leg pain (around the knee)  Skin: Negative for rash.  Neurological: Negative for seizures and headaches.  Hematological: Negative.   Psychiatric/Behavioral: Negative for hallucinations.  All other systems reviewed and are negative.    Allergies  Review of patient's allergies indicates no known allergies.  Home Medications   Current Outpatient Rx  Name Route Sig Dispense Refill  . HYDROCHLOROTHIAZIDE 25 MG PO TABS Oral Take 1 tablet (25 mg total) by mouth daily. 30 tablet 1  . TRAMADOL HCL 50 MG PO TABS Oral Take 1 tablet (50 mg total) by mouth every 6 (six) hours as needed for pain. 15 tablet 0    Triage Vitals: BP 158/99  Pulse 78  Temp 98.1 F (36.7 C) (Oral)  Resp 16  SpO2 97%  LMP 04/15/2012  Physical Exam  Nursing note and vitals reviewed. Constitutional: She is oriented to person, place, and time. She appears well-developed and well-nourished. No distress.  HENT:  Head: Normocephalic and atraumatic.  Mouth/Throat: Oropharynx is clear and moist.  Eyes: EOM are  normal. Pupils are equal, round, and reactive to light.  Neck: Neck supple. No tracheal deviation present. No thyromegaly present.  Cardiovascular: Normal rate, regular rhythm and intact distal pulses.   Pulmonary/Chest: Effort normal and breath sounds normal. No respiratory distress. She has no wheezes. She has no rales.  Abdominal: Soft. She exhibits no distension. There is no tenderness. There is no guarding.  Musculoskeletal: Normal range of motion. She exhibits tenderness. She exhibits no edema.       Achilles tendon is intact. Intact femoral pulse.   Negative anterior and posterior drawer tests of the left knee no laxity to varus or valgus stress  Lymphadenopathy:    She has no cervical adenopathy.  Neurological: She is alert and oriented to person, place, and time.  Skin: Skin is warm and dry. No erythema.  Psychiatric: She has a normal mood and affect.    ED Course  Procedures (including critical care time) DIAGNOSTIC STUDIES: Oxygen Saturation is 99% on room air, normal by my interpretation.    COORDINATION OF CARE: 23:30--I evaluated the patient and we discussed a treatment plan including knee immobilizer and pain injection to which the pt agreed. I told the pt that she needs to follow up with an orthopedist.    Labs Reviewed - No data to display Dg Knee 1-2 Views Left  04/18/2012  *RADIOLOGY REPORT*  Clinical Data: Anterior knee pain.  No injury.  LEFT KNEE - 1-2 VIEW  Comparison: None.  Findings: Mild medial compartment osteoarthritis.  There is no fracture.  Mild patellofemoral osteoarthritis.  No effusion. Anatomic alignment.  IMPRESSION: Mild medial and patellofemoral osteoarthritis.  Original Report Authenticated By: Andreas Newport, M.D.   US Venous Img Lower Unilateral Left  04/18/2012  *RADIOLOGY REPORT*  Clinical Data: Pain distal to the knee.  LEFT LOWER EXTREMITY VENOUS DOPPLER ULTRASOUND  Technique: Gray-scale sonography with compression, as well as color and duplex ultrasound, were performed to evaluate the deep venous system from the level of the common femoral vein through the popliteal and proximal calf veins.  Comparison: 04/10/2012  Findings:  Normal compressibility of  the common femoral, superficial femoral, and popliteal veins, as well as the proximal calf veins.  No filling defects to suggest DVT on grayscale or color Doppler imaging.  Doppler waveforms show normal direction of venous flow, normal respiratory phasicity and response to augmentation. No Baker's cyst seen.  IMPRESSION: No evidence of  lower extremity  deep vein thrombosis.  Original Report Authenticated By: Osa Craver, M.D.     No diagnosis found.    MDM  No indication to repeat labs or imaging as all performed within the past 7 days and normal.  Must follow up with orthopedics for ongoing care patient verbalizes understanding and agrees to follow up  I personally performed the services described in this documentation, which was scribed in my presence. The recorded information has been reviewed and considered.      Jasmine Awe, MD 04/19/12 [phone]

## 2012-04-27 ENCOUNTER — Ambulatory Visit: Payer: Self-pay | Attending: Orthopedic Surgery | Admitting: Rehabilitation

## 2012-04-27 DIAGNOSIS — IMO0001 Reserved for inherently not codable concepts without codable children: Secondary | ICD-10-CM | POA: Insufficient documentation

## 2012-04-27 DIAGNOSIS — M25669 Stiffness of unspecified knee, not elsewhere classified: Secondary | ICD-10-CM | POA: Insufficient documentation

## 2012-04-27 DIAGNOSIS — M25569 Pain in unspecified knee: Secondary | ICD-10-CM | POA: Insufficient documentation

## 2012-05-02 ENCOUNTER — Ambulatory Visit: Payer: Self-pay | Admitting: Rehabilitation

## 2012-05-05 ENCOUNTER — Ambulatory Visit: Payer: Self-pay | Admitting: Rehabilitation

## 2012-05-06 ENCOUNTER — Ambulatory Visit: Payer: Self-pay | Admitting: Rehabilitation

## 2012-06-08 ENCOUNTER — Emergency Department (HOSPITAL_BASED_OUTPATIENT_CLINIC_OR_DEPARTMENT_OTHER)
Admission: EM | Admit: 2012-06-08 | Discharge: 2012-06-08 | Disposition: A | Discharge status: Home / Self Care | Payer: Self-pay | Attending: Emergency Medicine | Admitting: Emergency Medicine

## 2012-06-08 ENCOUNTER — Encounter (HOSPITAL_BASED_OUTPATIENT_CLINIC_OR_DEPARTMENT_OTHER): Payer: Self-pay | Admitting: Family Medicine

## 2012-06-08 ENCOUNTER — Emergency Department (HOSPITAL_BASED_OUTPATIENT_CLINIC_OR_DEPARTMENT_OTHER): Payer: Self-pay

## 2012-06-08 DIAGNOSIS — R0602 Shortness of breath: Secondary | ICD-10-CM

## 2012-06-08 DIAGNOSIS — I10 Essential (primary) hypertension: Secondary | ICD-10-CM | POA: Insufficient documentation

## 2012-06-08 DIAGNOSIS — Z8673 Personal history of transient ischemic attack (TIA), and cerebral infarction without residual deficits: Secondary | ICD-10-CM | POA: Insufficient documentation

## 2012-06-08 DIAGNOSIS — J069 Acute upper respiratory infection, unspecified: Secondary | ICD-10-CM | POA: Insufficient documentation

## 2012-06-08 LAB — BASIC METABOLIC PANEL
BUN: 11 mg/dL (ref 6–23)
Calcium: 9.3 mg/dL (ref 8.4–10.5)
Chloride: 101 mEq/L (ref 96–112)
Creatinine, Ser: 0.9 mg/dL (ref 0.50–1.10)
GFR calc Af Amer: >90 mL/min (ref >90)

## 2012-06-08 LAB — D-DIMER, QUANTITATIVE: D-Dimer, Quant: 0.57 ug/mL-FEU — ABNORMAL HIGH (ref 0.00–0.48)

## 2012-06-08 MED ORDER — ALBUTEROL SULFATE HFA 108 (90 BASE) MCG/ACT IN AERS
2.0000 | INHALATION_SPRAY | Freq: Once | RESPIRATORY_TRACT | Status: AC
  Administered 2012-06-08: 2 via RESPIRATORY_TRACT
  Filled 2012-06-08: qty 6.7

## 2012-06-08 MED ORDER — FLUTICASONE PROPIONATE 50 MCG/ACT NA SUSP: 2.0000 | Freq: Every day | NASAL | Status: DC

## 2012-06-08 MED ORDER — IPRATROPIUM BROMIDE 0.02 % IN SOLN
0.5000 mg | RESPIRATORY_TRACT | Status: DC
  Administered 2012-06-08 (×2): 0.5 mg via RESPIRATORY_TRACT
  Filled 2012-06-08 (×2): qty 2.5

## 2012-06-08 MED ORDER — ALBUTEROL SULFATE (5 MG/ML) 0.5% IN NEBU
5.0000 mg | INHALATION_SOLUTION | RESPIRATORY_TRACT | Status: DC
  Administered 2012-06-08 (×2): 5 mg via RESPIRATORY_TRACT
  Filled 2012-06-08 (×2): qty 1

## 2012-06-08 MED ORDER — IOHEXOL 350 MG/ML SOLN
80.0000 mL | Freq: Once | INTRAVENOUS | Status: AC | PRN
  Administered 2012-06-08: 80 mL via INTRAVENOUS

## 2012-06-08 MED ORDER — ALBUTEROL SULFATE HFA 108 (90 BASE) MCG/ACT IN AERS: 2.0000 | INHALATION_SPRAY | Freq: Four times a day (QID) | RESPIRATORY_TRACT | Status: DC | PRN

## 2012-06-08 NOTE — ED Notes (Signed)
Pt sts she has had "cold symptoms" x 2 days and started feeling short of breath and having pain to center of chest with deep inspiration this morning. Lungs clear per RT, O2 sats 100% in triage. Pt reports dry cough. Denies n/v/d.

## 2012-06-08 NOTE — ED Provider Notes (Signed)
I saw and evaluated the patient, reviewed the resident's note and I agree with the findings and plan.   .Face to face Exam:  General:  Awake HEENT:  Atraumatic Resp:  Normal effort Abd:  Nondistended Neuro:No focal weakness Lymph: No adenopathy   Nelia Shi, MD 06/08/12 1312

## 2012-06-08 NOTE — ED Provider Notes (Signed)
History     CSN: [id number]  Arrival date & time 06/08/12  4098   First MD Initiated Contact with Patient 06/08/12 0957      Chief Complaint  Patient presents with  . Shortness of Breath    HPI [name] is a 41 yo woman with PMH of morbid obesity, TIA, remote hx of asthma, and HTN who presents today for evaluation of shortness of breath that started 2 days ago. She also has had URI symptoms with nasal congestion and a dry cough for the last 2 days. She denies chest pain, fever, back pain, leg pain or swelling.   Past Medical History  Diagnosis Date  . Obesity   . Stroke   . DVT (deep venous thrombosis)   . High cholesterol   . Migraine   . Hypertension     Past Surgical History  Procedure Date  . Tubal ligation     No family history on file.  History  Substance Use Topics  . Smoking status: Former Games developer  . Smokeless tobacco: Never Used  . Alcohol Use: No    OB History    Grav Para Term Preterm Abortions TAB SAB Ect Mult Living                  Review of Systems  Constitutional: Negative for fever, chills, diaphoresis, appetite change and fatigue.  HENT: Positive for congestion. Negative for sore throat and rhinorrhea.   Respiratory: Positive for cough and shortness of breath. Negative for wheezing.   Cardiovascular: Negative for chest pain, palpitations and leg swelling.  Gastrointestinal: Negative for nausea and vomiting.  Musculoskeletal: Negative for back pain.  Skin: Negative for rash.  Neurological: Negative for dizziness, light-headedness and headaches.    Allergies  Review of patient's allergies indicates no known allergies.  Home Medications   Current Outpatient Rx  Name Route Sig Dispense Refill  . GOODYS BODY PAIN PO Oral Take 1 packet by mouth once as needed. For pain    . HYDROCHLOROTHIAZIDE 25 MG PO TABS Oral Take 1 tablet (25 mg total) by mouth daily. 30 tablet 1  . IBUPROFEN 600 MG PO TABS Oral Take 600 mg by mouth every 6 (six)  hours as needed. For pain    . NAPROXEN 375 MG PO TABS Oral Take 1 tablet (375 mg total) by mouth 2 (two) times daily with a meal. 20 tablet 0    Pulse 78  Temp 98.9 F (37.2 C) (Oral)  Resp 18  Ht 5\' 4"  (1.626 m)  Wt 320 lb (145.151 kg)  BMI 54.93 kg/m2  SpO2 100%  LMP 05/30/2012  Physical Exam  Nursing note and vitals reviewed. Constitutional: She is oriented to person, place, and time. No distress.       Morbidly obese. Tired appearing. Wearing surgical mask (respiratory precautions)  HENT:       Mild nasal turbinate erythema and congestion  Eyes: Conjunctivae normal are normal. Right eye exhibits no discharge. Left eye exhibits no discharge. No scleral icterus.  Neck: Neck supple.  Cardiovascular: Normal rate and regular rhythm.        Difficult to auscultate 2/2 body habitus  Pulmonary/Chest: Breath sounds normal. No respiratory distress. She has no wheezes.       Poor respiratory effort when instructed to take deep breaths  Abdominal: Soft.  Musculoskeletal: She exhibits no edema and no tenderness.       No calf edema, erythema, or tenderness.   Neurological: She is alert  and oriented to person, place, and time.  Skin: Skin is warm and dry. No rash noted. She is not diaphoretic. No erythema. No pallor.  Psychiatric: She has a normal mood and affect.    ED Course  Procedures (including critical care time)  Labs Reviewed  D-DIMER, QUANTITATIVE - Abnormal; Notable for the following:    D-Dimer, Quant 0.57 (*)     All other components within normal limits  BASIC METABOLIC PANEL - Abnormal; Notable for the following:    Glucose, Bld 107 (*)     GFR calc non Af Amer 79 (*)     All other components within normal limits   Dg Chest 2 View  06/08/2012  *RADIOLOGY REPORT*  Clinical Data: Shortness of breath.  Chest pain.  CHEST - 2 VIEW  Comparison: 12/04/2010  Findings: Heart and mediastinal contours are within normal limits. No focal opacities or effusions.  No acute bony  abnormality.  IMPRESSION: No active cardiopulmonary disease.   Original Report Authenticated By: Cyndie Chime, M.D.    Ct Angio Chest Pe W/cm &/or Wo Cm  06/08/2012  *RADIOLOGY REPORT*  Clinical Data: Shortness of breath.  CT ANGIOGRAPHY CHEST  Technique:  Multidetector CT imaging of the chest using the standard protocol during bolus administration of intravenous contrast. Multiplanar reconstructed images including MIPs were obtained and reviewed to evaluate the vascular anatomy.  Contrast: 80mL OMNIPAQUE IOHEXOL 350 MG/ML SOLN  Comparison: 06/08/2012  Findings: No filling defects in the pulmonary arteries to suggest pulmonary emboli. Lungs are clear.  No focal airspace opacities or suspicious nodules.  No effusions.  Heart is borderline in size. Aorta is normal caliber. No mediastinal, hilar, or axillary adenopathy.  Visualized thyroid and chest wall soft tissues unremarkable. Imaging into the upper abdomen shows no acute findings.  No acute bony abnormality.  IMPRESSION: No evidence of pulmonary embolus.  No acute cardiopulmonary disease.   Original Report Authenticated By: Cyndie Chime, M.D.      No diagnosis found.    MDM  41 yo woman with PMH significant for morbid obesity, no recent trauma, injury, immobilization, not on estrogen tx, with self-reported hx of DVT in 2012 although no records in [place].   Shortness of breath. Given hx and timing likely 2/2 URI although PE should be considered. She had leg pain and swelling with LE Dopplers on 04/18/12 and 04/10/12 that were both negative for DVT. She has a self reported remote hx of asthma, will order breathing tx, and CXR as pt unable to take full deep breath for appropriate lung ausculation. Well's score is 0.  -EKG unremarkable, normal -Ordered D-dimer, elevated.  -CT scan negative for PE -CXR unremarkable -BMET unremarkable, except for mild hyperglycemia -Albuterol neb, Atrovent neb q 1h x3. Peak flow before and after treatments, pt's SOP  improved greatly after 2 treatments.   Dispo. Pt to be discharged home with albuterol inhaler, and Flonase nasal spray for possible allergic rhinitis.          Ky Barban, MD 06/08/12 [phone]

## 2012-07-22 ENCOUNTER — Emergency Department (HOSPITAL_BASED_OUTPATIENT_CLINIC_OR_DEPARTMENT_OTHER)
Admission: EM | Admit: 2012-07-22 | Discharge: 2012-07-22 | Disposition: A | Discharge status: Home / Self Care | Payer: Self-pay | Attending: Emergency Medicine | Admitting: Emergency Medicine

## 2012-07-22 ENCOUNTER — Encounter (HOSPITAL_BASED_OUTPATIENT_CLINIC_OR_DEPARTMENT_OTHER): Payer: Self-pay | Admitting: *Deleted

## 2012-07-22 DIAGNOSIS — E7800 Pure hypercholesterolemia, unspecified: Secondary | ICD-10-CM | POA: Insufficient documentation

## 2012-07-22 DIAGNOSIS — R5381 Other malaise: Secondary | ICD-10-CM | POA: Insufficient documentation

## 2012-07-22 DIAGNOSIS — R5383 Other fatigue: Secondary | ICD-10-CM

## 2012-07-22 DIAGNOSIS — Z87891 Personal history of nicotine dependence: Secondary | ICD-10-CM | POA: Insufficient documentation

## 2012-07-22 DIAGNOSIS — I10 Essential (primary) hypertension: Secondary | ICD-10-CM | POA: Insufficient documentation

## 2012-07-22 DIAGNOSIS — Z8673 Personal history of transient ischemic attack (TIA), and cerebral infarction without residual deficits: Secondary | ICD-10-CM | POA: Insufficient documentation

## 2012-07-22 DIAGNOSIS — B9789 Other viral agents as the cause of diseases classified elsewhere: Secondary | ICD-10-CM | POA: Insufficient documentation

## 2012-07-22 DIAGNOSIS — R51 Headache: Secondary | ICD-10-CM | POA: Insufficient documentation

## 2012-07-22 DIAGNOSIS — Z86718 Personal history of other venous thrombosis and embolism: Secondary | ICD-10-CM | POA: Insufficient documentation

## 2012-07-22 DIAGNOSIS — Z79899 Other long term (current) drug therapy: Secondary | ICD-10-CM | POA: Insufficient documentation

## 2012-07-22 DIAGNOSIS — E669 Obesity, unspecified: Secondary | ICD-10-CM | POA: Insufficient documentation

## 2012-07-22 DIAGNOSIS — B349 Viral infection, unspecified: Secondary | ICD-10-CM

## 2012-07-22 LAB — CBC WITH DIFFERENTIAL/PLATELET
Basophils Absolute: 0 10*3/uL (ref 0.0–0.1)
Basophils Relative: 0 % (ref 0–1)
Eosinophils Relative: 2 % (ref 0–5)
HCT: 32.1 % — ABNORMAL LOW (ref 36.0–46.0)
MCHC: 32.7 g/dL (ref 30.0–36.0)
Monocytes Absolute: 0.7 10*3/uL (ref 0.1–1.0)
Neutro Abs: 3 10*3/uL (ref 1.7–7.7)
Platelets: 332 10*3/uL (ref 150–400)
RDW: 14.9 % (ref 11.5–15.5)
WBC: 7.3 10*3/uL (ref 4.0–10.5)

## 2012-07-22 LAB — COMPREHENSIVE METABOLIC PANEL
ALT: 17 U/L (ref 0–35)
AST: 16 U/L (ref 0–37)
Albumin: 3.3 g/dL — ABNORMAL LOW (ref 3.5–5.2)
Calcium: 9.1 mg/dL (ref 8.4–10.5)
Chloride: 101 mEq/L (ref 96–112)
Creatinine, Ser: 0.9 mg/dL (ref 0.50–1.10)
Sodium: 138 mEq/L (ref 135–145)

## 2012-07-22 NOTE — ED Provider Notes (Signed)
History     CSN: [id number]  Arrival date & time 07/22/12  1621   First MD Initiated Contact with Patient 07/22/12 1814      Chief Complaint  Patient presents with  . Headache  . Fatigue    (Consider location/radiation/quality/duration/timing/severity/associated sxs/prior treatment) HPI Comments: Patient was at work at [employer] when she became acutely weak and tired.  She reports a mild headache for the past two days.  There have been sick children at work.  No sore throat, fever, chills, abd pain, n/v/d.  Patient is a 41 y.o. female presenting with weakness. The history is provided by the patient.  Weakness Primary symptoms do not include fever. Episode onset: this afternoon. The symptoms are unchanged. The neurological symptoms are diffuse.  Additional symptoms include weakness.    Past Medical History  Diagnosis Date  . Obesity   . Stroke   . DVT (deep venous thrombosis)   . High cholesterol   . Migraine   . Hypertension     Past Surgical History  Procedure Date  . Tubal ligation     History reviewed. No pertinent family history.  History  Substance Use Topics  . Smoking status: Former Games developer  . Smokeless tobacco: Never Used  . Alcohol Use: No    OB History    Grav Para Term Preterm Abortions TAB SAB Ect Mult Living                  Review of Systems  Constitutional: Positive for fatigue. Negative for fever, chills and diaphoresis.  Neurological: Positive for weakness.  All other systems reviewed and are negative.    Allergies  Review of patient's allergies indicates no known allergies.  Home Medications   Current Outpatient Rx  Name  Route  Sig  Dispense  Refill  . ALBUTEROL SULFATE HFA 108 (90 BASE) MCG/ACT IN AERS   Inhalation   Inhale 2 puffs into the lungs every 6 (six) hours as needed for shortness of breath.   1 Inhaler   1   . GOODYS BODY PAIN PO   Oral   Take 1 packet by mouth once as needed. For pain         . FLUTICASONE  PROPIONATE 50 MCG/ACT NA SUSP   Nasal   Place 2 sprays into the nose daily.   16 g   1   . HYDROCHLOROTHIAZIDE 25 MG PO TABS   Oral   Take 1 tablet (25 mg total) by mouth daily.   30 tablet   1   . IBUPROFEN 600 MG PO TABS   Oral   Take 600 mg by mouth every 6 (six) hours as needed. For pain         . NAPROXEN 375 MG PO TABS   Oral   Take 1 tablet (375 mg total) by mouth 2 (two) times daily with a meal.   20 tablet   0     BP 137/88  Pulse 89  Temp 98.2 F (36.8 C) (Oral)  Resp 18  SpO2 100%  LMP 07/18/2012  Physical Exam  Nursing note and vitals reviewed. Constitutional: She is oriented to person, place, and time. She appears well-developed and well-nourished. No distress.  HENT:  Head: Normocephalic and atraumatic.  Neck: Normal range of motion. Neck supple.  Cardiovascular: Normal rate and regular rhythm.  Exam reveals no gallop and no friction rub.   No murmur heard. Pulmonary/Chest: Effort normal and breath sounds normal. No respiratory distress. She  has no wheezes.  Abdominal: Soft. Bowel sounds are normal. She exhibits no distension. There is no tenderness.  Musculoskeletal: Normal range of motion.  Neurological: She is alert and oriented to person, place, and time.  Skin: Skin is warm and dry. She is not diaphoretic.    ED Course  Procedures (including critical care time)   Labs Reviewed  CBC WITH DIFFERENTIAL  COMPREHENSIVE METABOLIC PANEL  TROPONIN I   No results found.   No diagnosis found.   Date: 07/22/2012  Rate: 75  Rhythm: normal sinus rhythm  QRS Axis: normal  Intervals: normal  ST/T Wave abnormalities: normal  Conduction Disutrbances:none  Narrative Interpretation:   Old EKG Reviewed: unchanged    MDM  Likely viral etiology.  Labs, ekg are unremarkable and doubt any emergent pathology.  Return prn.        Geoffery Lyons, MD 07/23/12 [phone]

## 2012-07-22 NOTE — ED Notes (Signed)
Pt amb to triage with quick steady gait in nad. Pt reports she was at work and had a sudden onset of "feeling real tired and weak". Also reports "mild headache" for two days, denies any visual changes, grips are = and strong, pearl, moe + x 4 ext, speech is clear, smile symetrical.

## 2012-09-10 ENCOUNTER — Emergency Department (HOSPITAL_BASED_OUTPATIENT_CLINIC_OR_DEPARTMENT_OTHER)
Admission: EM | Admit: 2012-09-10 | Discharge: 2012-09-10 | Disposition: A | Discharge status: Home / Self Care | Payer: Self-pay | Attending: Emergency Medicine | Admitting: Emergency Medicine

## 2012-09-10 ENCOUNTER — Encounter (HOSPITAL_BASED_OUTPATIENT_CLINIC_OR_DEPARTMENT_OTHER): Payer: Self-pay | Admitting: Emergency Medicine

## 2012-09-10 ENCOUNTER — Emergency Department (HOSPITAL_BASED_OUTPATIENT_CLINIC_OR_DEPARTMENT_OTHER): Payer: Self-pay

## 2012-09-10 DIAGNOSIS — E7800 Pure hypercholesterolemia, unspecified: Secondary | ICD-10-CM | POA: Insufficient documentation

## 2012-09-10 DIAGNOSIS — E669 Obesity, unspecified: Secondary | ICD-10-CM | POA: Insufficient documentation

## 2012-09-10 DIAGNOSIS — Z79899 Other long term (current) drug therapy: Secondary | ICD-10-CM | POA: Insufficient documentation

## 2012-09-10 DIAGNOSIS — G43909 Migraine, unspecified, not intractable, without status migrainosus: Secondary | ICD-10-CM | POA: Insufficient documentation

## 2012-09-10 DIAGNOSIS — Z8673 Personal history of transient ischemic attack (TIA), and cerebral infarction without residual deficits: Secondary | ICD-10-CM | POA: Insufficient documentation

## 2012-09-10 DIAGNOSIS — M79609 Pain in unspecified limb: Secondary | ICD-10-CM | POA: Insufficient documentation

## 2012-09-10 DIAGNOSIS — I10 Essential (primary) hypertension: Secondary | ICD-10-CM | POA: Insufficient documentation

## 2012-09-10 DIAGNOSIS — Z87891 Personal history of nicotine dependence: Secondary | ICD-10-CM | POA: Insufficient documentation

## 2012-09-10 DIAGNOSIS — M79603 Pain in arm, unspecified: Secondary | ICD-10-CM

## 2012-09-10 DIAGNOSIS — Z86718 Personal history of other venous thrombosis and embolism: Secondary | ICD-10-CM | POA: Insufficient documentation

## 2012-09-10 MED ORDER — IBUPROFEN 800 MG PO TABS
800.0000 mg | ORAL_TABLET | Freq: Once | ORAL | Status: AC
  Administered 2012-09-10: 800 mg via ORAL
  Filled 2012-09-10: qty 1

## 2012-09-10 MED ORDER — OXYCODONE-ACETAMINOPHEN 5-325 MG PO TABS: 1.0000 | ORAL_TABLET | ORAL | Status: DC | PRN

## 2012-09-10 NOTE — ED Provider Notes (Signed)
History     CSN: [id number]  Arrival date & time 09/10/12  1728   First MD Initiated Contact with Patient 09/10/12 1911      Chief Complaint  Patient presents with  . Arm Pain    (Consider location/radiation/quality/duration/timing/severity/associated sxs/prior treatment) Patient is a 41 y.o. female presenting with arm pain. The history is provided by the patient. No language interpreter was used.  Arm Pain This is a chronic problem. The current episode started 1 to 4 weeks ago. The problem occurs daily. The problem has been gradually worsening. Pertinent negatives include no fever, joint swelling, nausea, numbness, vomiting or weakness. Exacerbated by: abduction/ external rotation. She has tried NSAIDs for the symptoms. The treatment provided mild relief.   41 year old female who is morbidly obese with a right upper extremity pain that has been worsening over the last several weeks. The pain is now gone into her right shoulder. She has pain with external and internal rotation. She is positive skin test below the pain. Denies weakness, numbness, neck pain.    Past Medical History  Diagnosis Date  . Obesity   . Stroke   . DVT (deep venous thrombosis)   . High cholesterol   . Migraine   . Hypertension     Past Surgical History  Procedure Date  . Tubal ligation     No family history on file.  History  Substance Use Topics  . Smoking status: Former Games developer  . Smokeless tobacco: Never Used  . Alcohol Use: No    OB History    Grav Para Term Preterm Abortions TAB SAB Ect Mult Living                  Review of Systems  Constitutional: Negative.  Negative for fever.  HENT: Negative.   Eyes: Negative.   Respiratory: Negative.   Cardiovascular: Negative.   Gastrointestinal: Negative.  Negative for nausea and vomiting.  Musculoskeletal: Negative for joint swelling.       Left upper extremity pain shoulder pain  Neurological: Negative.  Negative for weakness and  numbness.  Psychiatric/Behavioral: Negative.   All other systems reviewed and are negative.    Allergies  Review of patient's allergies indicates no known allergies.  Home Medications   Current Outpatient Rx  Name  Route  Sig  Dispense  Refill  . ALBUTEROL SULFATE HFA 108 (90 BASE) MCG/ACT IN AERS   Inhalation   Inhale 2 puffs into the lungs every 6 (six) hours as needed for shortness of breath.   1 Inhaler   1   . GOODYS BODY PAIN PO   Oral   Take 1 packet by mouth once as needed. For pain         . FLUTICASONE PROPIONATE 50 MCG/ACT NA SUSP   Nasal   Place 2 sprays into the nose daily.   16 g   1   . HYDROCHLOROTHIAZIDE 25 MG PO TABS   Oral   Take 1 tablet (25 mg total) by mouth daily.   30 tablet   1   . IBUPROFEN 600 MG PO TABS   Oral   Take 600 mg by mouth every 6 (six) hours as needed. For pain         . NAPROXEN 375 MG PO TABS   Oral   Take 1 tablet (375 mg total) by mouth 2 (two) times daily with a meal.   20 tablet   0   . OXYCODONE-ACETAMINOPHEN 5-325 MG PO TABS  Oral   Take 1 tablet by mouth every 4 (four) hours as needed for pain.   6 tablet   0     BP 152/100  Pulse 98  Temp 98 F (36.7 C) (Oral)  Resp 28  SpO2 97%  LMP 09/06/2012  Physical Exam  Nursing note and vitals reviewed. Constitutional: She is oriented to person, place, and time. She appears well-developed and well-nourished.  HENT:  Head: Normocephalic and atraumatic.  Eyes: Conjunctivae normal and EOM are normal. Pupils are equal, round, and reactive to light.  Neck: Normal range of motion. Neck supple.  Cardiovascular: Normal rate.   Pulmonary/Chest: Effort normal.  Abdominal: Soft.  Musculoskeletal: Normal range of motion. She exhibits tenderness. She exhibits no edema.       Left shoulder, left upper extremity tenderness positive CMS below pain skin is cool to touch no edema  Neurological: She is alert and oriented to person, place, and time. She has normal  reflexes.  Skin: Skin is warm and dry.  Psychiatric: She has a normal mood and affect.    ED Course  Procedures (including critical care time)  Labs Reviewed - No data to display US Venous Img Upper Uni Right  09/10/2012  *RADIOLOGY REPORT*  Clinical Data:  Right arm pain for 2 weeks.  History of DVT in the leg.  RIGHT UPPER EXTREMITY VENOUS DUPLEX ULTRASOUND  Technique:  Gray-scale sonography with graded compression, as well as color Doppler and duplex ultrasound were performed to evaluate the upper extremity deep venous system from the level of the subclavian vein and including the jugular, axillary, basilic and upper cephalic vein.  Spectral Doppler was utilized to evaluate flow at rest and with distal augmentation maneuvers.  Comparison:  CTA of the chest performed 06/08/2012  Findings:  Normal compressibility of the upper extremity deep veins is demonstrated.  No venous filling defects visualized on grayscale or color Doppler US.  Normal direction of flow is seen throughout the deep veins.  Spectral Doppler waveforms show normal morphology at rest and with distal augmentation.  There is question of a duplicated brachial vein.  IMPRESSION: No evidence of upper extremity deep venous thrombosis.   Original Report Authenticated By: Tonia Ghent, M.D.      1. Arm pain       MDM  Right shoulder/right upper extremity pain worsening x2 weeks. Doppler study negative for a blood clot. Patient will followup with your PCP or the orthopedic Dr. listed in her discharge papers. Continued and NSAIDs and prescription for Percocet. Intermittent ice. Did not x-ray the shoulder. Do not suspect a fracture. Patient understands to return to the ER for worsening symptoms such as weakness or numbness.        Remi Haggard, NP 09/10/12 2159

## 2012-09-10 NOTE — ED Notes (Signed)
Pain in right upper arm x several weeks has been getting worse and now worse in shoulder and elbow

## 2012-09-15 NOTE — ED Provider Notes (Signed)
Medical screening examination/treatment/procedure(s) were performed by non-physician practitioner and as supervising physician I was immediately available for consultation/collaboration.  Kabe Mckoy K Linker, MD 09/15/12 1346 

## 2012-09-21 ENCOUNTER — Emergency Department (HOSPITAL_BASED_OUTPATIENT_CLINIC_OR_DEPARTMENT_OTHER)
Admission: EM | Admit: 2012-09-21 | Discharge: 2012-09-21 | Disposition: A | Discharge status: Home / Self Care | Payer: Self-pay | Attending: Emergency Medicine | Admitting: Emergency Medicine

## 2012-09-21 ENCOUNTER — Encounter (HOSPITAL_BASED_OUTPATIENT_CLINIC_OR_DEPARTMENT_OTHER): Payer: Self-pay | Admitting: Emergency Medicine

## 2012-09-21 DIAGNOSIS — E7800 Pure hypercholesterolemia, unspecified: Secondary | ICD-10-CM | POA: Insufficient documentation

## 2012-09-21 DIAGNOSIS — Z79899 Other long term (current) drug therapy: Secondary | ICD-10-CM | POA: Insufficient documentation

## 2012-09-21 DIAGNOSIS — J069 Acute upper respiratory infection, unspecified: Secondary | ICD-10-CM | POA: Insufficient documentation

## 2012-09-21 DIAGNOSIS — Z8673 Personal history of transient ischemic attack (TIA), and cerebral infarction without residual deficits: Secondary | ICD-10-CM | POA: Insufficient documentation

## 2012-09-21 DIAGNOSIS — Z86718 Personal history of other venous thrombosis and embolism: Secondary | ICD-10-CM | POA: Insufficient documentation

## 2012-09-21 DIAGNOSIS — I10 Essential (primary) hypertension: Secondary | ICD-10-CM | POA: Insufficient documentation

## 2012-09-21 DIAGNOSIS — E669 Obesity, unspecified: Secondary | ICD-10-CM | POA: Insufficient documentation

## 2012-09-21 DIAGNOSIS — Z87891 Personal history of nicotine dependence: Secondary | ICD-10-CM | POA: Insufficient documentation

## 2012-09-21 DIAGNOSIS — Z8679 Personal history of other diseases of the circulatory system: Secondary | ICD-10-CM | POA: Insufficient documentation

## 2012-09-21 LAB — RAPID STREP SCREEN (MED CTR MEBANE ONLY): Streptococcus, Group A Screen (Direct): NEGATIVE

## 2012-09-21 MED ORDER — ACETAMINOPHEN-CODEINE 120-12 MG/5ML PO SUSP: 10.0000 mL | Freq: Four times a day (QID) | ORAL | Status: DC | PRN

## 2012-09-21 NOTE — ED Notes (Signed)
C/o sore throat, left ear pain and HA onset today, no F/V/D or other complaints, NAD

## 2012-09-21 NOTE — ED Provider Notes (Signed)
History     CSN: [id number]  Arrival date & time 09/21/12  1109   First MD Initiated Contact with Patient 09/21/12 1203      Chief Complaint  Patient presents with  . Sore Throat    (Consider location/radiation/quality/duration/timing/severity/associated sxs/prior treatment) Patient is a 42 y.o. female presenting with pharyngitis. The history is provided by the patient. No language interpreter was used.  Sore Throat This is a new problem. The current episode started yesterday. The problem occurs constantly. The problem has been unchanged. Associated symptoms include coughing and a sore throat. Pertinent negatives include no fever. The symptoms are aggravated by swallowing. She has tried nothing for the symptoms.    Past Medical History  Diagnosis Date  . Obesity   . Stroke   . DVT (deep venous thrombosis)   . High cholesterol   . Migraine   . Hypertension     Past Surgical History  Procedure Date  . Tubal ligation     No family history on file.  History  Substance Use Topics  . Smoking status: Former Games developer  . Smokeless tobacco: Never Used  . Alcohol Use: No    OB History    Grav Para Term Preterm Abortions TAB SAB Ect Mult Living                  Review of Systems  Constitutional: Negative for fever.  HENT: Positive for ear pain and sore throat.   Respiratory: Positive for cough.   Cardiovascular: Negative.     Allergies  Review of patient's allergies indicates no known allergies.  Home Medications   Current Outpatient Rx  Name  Route  Sig  Dispense  Refill  . ACETAMINOPHEN-CODEINE 120-12 MG/5ML PO SUSP   Oral   Take 10 mLs by mouth every 6 (six) hours as needed for pain.   100 mL   0   . ALBUTEROL SULFATE HFA 108 (90 BASE) MCG/ACT IN AERS   Inhalation   Inhale 2 puffs into the lungs every 6 (six) hours as needed for shortness of breath.   1 Inhaler   1   . GOODYS BODY PAIN PO   Oral   Take 1 packet by mouth once as needed. For pain        . FLUTICASONE PROPIONATE 50 MCG/ACT NA SUSP   Nasal   Place 2 sprays into the nose daily.   16 g   1   . HYDROCHLOROTHIAZIDE 25 MG PO TABS   Oral   Take 1 tablet (25 mg total) by mouth daily.   30 tablet   1   . IBUPROFEN 600 MG PO TABS   Oral   Take 600 mg by mouth every 6 (six) hours as needed. For pain         . NAPROXEN 375 MG PO TABS   Oral   Take 1 tablet (375 mg total) by mouth 2 (two) times daily with a meal.   20 tablet   0   . OXYCODONE-ACETAMINOPHEN 5-325 MG PO TABS   Oral   Take 1 tablet by mouth every 4 (four) hours as needed for pain.   6 tablet   0     BP 148/92  Pulse 81  Temp 98.3 F (36.8 C) (Oral)  Resp 16  Ht 5\' 4"  (1.626 m)  Wt 320 lb (145.151 kg)  BMI 54.93 kg/m2  LMP 09/06/2012  Physical Exam  Nursing note and vitals reviewed. Constitutional: She is oriented to  person, place, and time. She appears well-developed and well-nourished.  HENT:  Head: Normocephalic and atraumatic.  Nose: Rhinorrhea present.  Mouth/Throat: Posterior oropharyngeal erythema present.  Eyes: Conjunctivae normal and EOM are normal.  Neck: Normal range of motion. Neck supple.  Cardiovascular: Normal rate and regular rhythm.   Pulmonary/Chest: Effort normal and breath sounds normal.  Neurological: She is alert and oriented to person, place, and time.    ED Course  Procedures (including critical care time)   Labs Reviewed  RAPID STREP SCREEN   No results found.   1. URI, acute       MDM  Will treat symptomatically pt can use mucinex otc        Teressa Lower, NP 09/21/12 1236

## 2012-09-21 NOTE — ED Provider Notes (Signed)
Medical screening examination/treatment/procedure(s) were performed by non-physician practitioner and as supervising physician I was immediately available for consultation/collaboration.   Dione Booze, MD 09/21/12 1440

## 2013-01-19 ENCOUNTER — Emergency Department (HOSPITAL_BASED_OUTPATIENT_CLINIC_OR_DEPARTMENT_OTHER): Payer: Self-pay

## 2013-01-19 ENCOUNTER — Emergency Department (HOSPITAL_BASED_OUTPATIENT_CLINIC_OR_DEPARTMENT_OTHER)
Admission: EM | Admit: 2013-01-19 | Discharge: 2013-01-19 | Disposition: A | Discharge status: Home / Self Care | Payer: Self-pay | Attending: Emergency Medicine | Admitting: Emergency Medicine

## 2013-01-19 ENCOUNTER — Encounter (HOSPITAL_BASED_OUTPATIENT_CLINIC_OR_DEPARTMENT_OTHER): Payer: Self-pay | Admitting: Family Medicine

## 2013-01-19 DIAGNOSIS — Z87891 Personal history of nicotine dependence: Secondary | ICD-10-CM | POA: Insufficient documentation

## 2013-01-19 DIAGNOSIS — Z7982 Long term (current) use of aspirin: Secondary | ICD-10-CM | POA: Insufficient documentation

## 2013-01-19 DIAGNOSIS — Z8673 Personal history of transient ischemic attack (TIA), and cerebral infarction without residual deficits: Secondary | ICD-10-CM | POA: Insufficient documentation

## 2013-01-19 DIAGNOSIS — Z79899 Other long term (current) drug therapy: Secondary | ICD-10-CM | POA: Insufficient documentation

## 2013-01-19 DIAGNOSIS — R51 Headache: Secondary | ICD-10-CM | POA: Insufficient documentation

## 2013-01-19 DIAGNOSIS — IMO0002 Reserved for concepts with insufficient information to code with codable children: Secondary | ICD-10-CM | POA: Insufficient documentation

## 2013-01-19 DIAGNOSIS — I10 Essential (primary) hypertension: Secondary | ICD-10-CM | POA: Insufficient documentation

## 2013-01-19 DIAGNOSIS — J3489 Other specified disorders of nose and nasal sinuses: Secondary | ICD-10-CM | POA: Insufficient documentation

## 2013-01-19 DIAGNOSIS — R509 Fever, unspecified: Secondary | ICD-10-CM | POA: Insufficient documentation

## 2013-01-19 DIAGNOSIS — E669 Obesity, unspecified: Secondary | ICD-10-CM | POA: Insufficient documentation

## 2013-01-19 DIAGNOSIS — J309 Allergic rhinitis, unspecified: Secondary | ICD-10-CM | POA: Insufficient documentation

## 2013-01-19 DIAGNOSIS — Z791 Long term (current) use of non-steroidal anti-inflammatories (NSAID): Secondary | ICD-10-CM | POA: Insufficient documentation

## 2013-01-19 DIAGNOSIS — R0982 Postnasal drip: Secondary | ICD-10-CM | POA: Insufficient documentation

## 2013-01-19 DIAGNOSIS — Z8679 Personal history of other diseases of the circulatory system: Secondary | ICD-10-CM | POA: Insufficient documentation

## 2013-01-19 DIAGNOSIS — J302 Other seasonal allergic rhinitis: Secondary | ICD-10-CM

## 2013-01-19 DIAGNOSIS — Z9851 Tubal ligation status: Secondary | ICD-10-CM | POA: Insufficient documentation

## 2013-01-19 DIAGNOSIS — E7800 Pure hypercholesterolemia, unspecified: Secondary | ICD-10-CM | POA: Insufficient documentation

## 2013-01-19 DIAGNOSIS — J029 Acute pharyngitis, unspecified: Secondary | ICD-10-CM | POA: Insufficient documentation

## 2013-01-19 DIAGNOSIS — R1012 Left upper quadrant pain: Secondary | ICD-10-CM | POA: Insufficient documentation

## 2013-01-19 MED ORDER — LORATADINE-PSEUDOEPHEDRINE ER 10-240 MG PO TB24: 1.0000 | ORAL_TABLET | Freq: Every day | ORAL | Status: DC

## 2013-01-19 NOTE — ED Notes (Signed)
Pt c/o cough productive since last night and sts she feels "feverish". Pt sts she took exedrin for headache last night. Pt also reports an intermittent "discomfort" to LUQ x 1 wk and is not present today.

## 2013-01-19 NOTE — ED Provider Notes (Signed)
History     CSN: [id number]  Arrival date & time 01/19/13  1017   First MD Initiated Contact with Patient 01/19/13 1026      Chief Complaint  Patient presents with  . Cough    (Consider location/radiation/quality/duration/timing/severity/associated sxs/prior treatment) Patient is a 42 y.o. female presenting with cough.  Cough  Pt reports a mild dull aching pain in LUQ for the last several days, not associated with any vomiting but she has had nasal congestion and post-nasal drip the last few days. Since yesterday she has had mild sore throat and productive cough with subjective fever and headache. She was advised by work to come get checked out. She has taken Excedrin for her headache but no other symptomatic meds for her cough, sore throat or runny nose.  Past Medical History  Diagnosis Date  . Obesity   . Stroke   . DVT (deep venous thrombosis)   . High cholesterol   . Migraine   . Hypertension     Past Surgical History  Procedure Laterality Date  . Tubal ligation      No family history on file.  History  Substance Use Topics  . Smoking status: Former Games developer  . Smokeless tobacco: Never Used  . Alcohol Use: Yes    OB History   Grav Para Term Preterm Abortions TAB SAB Ect Mult Living                  Review of Systems  Respiratory: Positive for cough.    All other systems reviewed and are negative except as noted in HPI.   Allergies  Review of patient's allergies indicates no known allergies.  Home Medications   Current Outpatient Rx  Name  Route  Sig  Dispense  Refill  . acetaminophen-codeine 120-12 MG/5ML suspension   Oral   Take 10 mLs by mouth every 6 (six) hours as needed for pain.   100 mL   0   . albuterol (PROVENTIL HFA;VENTOLIN HFA) 108 (90 BASE) MCG/ACT inhaler   Inhalation   Inhale 2 puffs into the lungs every 6 (six) hours as needed for shortness of breath.   1 Inhaler   1   . Aspirin-Acetaminophen (GOODYS BODY PAIN PO)   Oral    Take 1 packet by mouth once as needed. For pain         . fluticasone (FLONASE) 50 MCG/ACT nasal spray   Nasal   Place 2 sprays into the nose daily.   16 g   1   . hydrochlorothiazide (HYDRODIURIL) 25 MG tablet   Oral   Take 1 tablet (25 mg total) by mouth daily.   30 tablet   1   . ibuprofen (ADVIL,MOTRIN) 600 MG tablet   Oral   Take 600 mg by mouth every 6 (six) hours as needed. For pain         . naproxen (NAPROSYN) 375 MG tablet   Oral   Take 1 tablet (375 mg total) by mouth 2 (two) times daily with a meal.   20 tablet   0   . oxyCODONE-acetaminophen (PERCOCET/ROXICET) 5-325 MG per tablet   Oral   Take 1 tablet by mouth every 4 (four) hours as needed for pain.   6 tablet   0     BP 177/86  Pulse 93  Temp(Src) 98.7 F (37.1 C) (Oral)  Resp 20  SpO2 100%  LMP 01/05/2013  Physical Exam  Nursing note and vitals reviewed. Constitutional: She  is oriented to person, place, and time. She appears well-developed and well-nourished.  HENT:  Head: Normocephalic and atraumatic.  Nose: Mucosal edema and rhinorrhea present.  Eyes: EOM are normal. Pupils are equal, round, and reactive to light.  Neck: Normal range of motion. Neck supple.  Cardiovascular: Normal rate, normal heart sounds and intact distal pulses.   Pulmonary/Chest: Effort normal and breath sounds normal.  Abdominal: Bowel sounds are normal. She exhibits no distension. There is no tenderness. There is no rebound and no guarding.  Musculoskeletal: Normal range of motion. She exhibits no edema and no tenderness.  Lymphadenopathy:    She has no cervical adenopathy.  Neurological: She is alert and oriented to person, place, and time. She has normal strength. No cranial nerve deficit or sensory deficit.  Skin: Skin is warm and dry. No rash noted.  Psychiatric: She has a normal mood and affect.    ED Course  Procedures (including critical care time)  Labs Reviewed - No data to display Dg Chest 2  View  01/19/2013  *RADIOLOGY REPORT*  Clinical Data: Cough, fever  CHEST - 2 VIEW  Comparison:  06/08/2012  Findings:  The heart size and mediastinal contours are within normal limits.  Both lungs are clear.  The visualized skeletal structures are unremarkable.  IMPRESSION: No active cardiopulmonary disease.   Original Report Authenticated By: Judie Petit. Shick, M.D.      1. Seasonal allergies       MDM  Abd benign. Will send for CXR to eval occult pneumonia. Posterior pharynx is normal, no adenopathy, she has 0/4 Centor criteria, doubt strep pharyngitis.   11:27 AM CXR is neg. Suspect this is seasonal allergies. Advised antihistamine/decongestants. PCP followup.         Charles B. Bernette Mayers, MD 01/19/13 [phone]

## 2013-02-03 ENCOUNTER — Ambulatory Visit: Payer: Self-pay | Attending: Family Medicine | Admitting: Family Medicine

## 2013-02-03 VITALS — BP 146/91 | HR 76 | Temp 98.1°F | Resp 16 | Ht 65.0 in | Wt 359.0 lb

## 2013-02-03 DIAGNOSIS — J329 Chronic sinusitis, unspecified: Secondary | ICD-10-CM | POA: Insufficient documentation

## 2013-02-03 MED ORDER — AMOXICILLIN 500 MG PO CAPS: 1000.0000 mg | ORAL_CAPSULE | Freq: Two times a day (BID) | ORAL | Status: DC

## 2013-02-03 MED ORDER — BUTALBITAL-APAP-CAFFEINE 50-325-40 MG PO TABS: ORAL_TABLET | ORAL | Status: DC

## 2013-02-03 NOTE — Progress Notes (Signed)
Subjective:     Patient ID: Terri Boyd, female   DOB: 1971/04/12, 42 y.o.   MRN: [id number]  HPI Pt here with 2 weeks of worsening left sided pain/pressure behind her eye. She has a h/o allergic rhinitis worse in the apring and has been taking claritin D. Pressure and pain worse when she bends over.  Vision not affected. No ear/throat pain. No fevers.   Review of Systems per hpi     Objective:   Physical Exam  Constitutional: She appears well-developed and well-nourished.  HENT:  Mouth/Throat: Oropharynx is clear and moist.  Nares both boggy turbinates, sinuses nontender to palpation, ears with effusions behind tm's but no infections.   Eyes: Conjunctivae and EOM are normal. Pupils are equal, round, and reactive to light.  fundo wnl b/l  Skin: Skin is warm and dry.  Psychiatric: She has a normal mood and affect.       Assessment:     Sinusitis - Plan: amoxicillin (AMOXIL) 500 MG capsule, butalbital-acetaminophen-caffeine (FIORICET) 50-325-40 MG per tablet       Plan:     Sinusitis - suspect sinusitis secondary to allergic rhinitis. As she is unable to afford doxy or augmentin, will try amox. fioricet for pain (gave info on max dose of apap/ibuprofen in case unable to affor fioricet as well).   If worse, or vision changes, be seen immediately  Otherwise, will see her back in 1 week. If not improving need to consider other etiologies for pain vs sinus infection resistant to amox.  Call with questions or concerns.

## 2013-02-03 NOTE — Patient Instructions (Signed)

## 2013-02-03 NOTE — Progress Notes (Signed)
Patient complains of headaches mostly Behind left eye.  At times feels like a sharp stabbing Pain.  Has been going on for a couple of weeks Was seen in the Ed for it Has a history of migranes

## 2013-02-17 ENCOUNTER — Ambulatory Visit: Payer: Self-pay | Attending: Family Medicine | Admitting: Internal Medicine

## 2013-02-17 VITALS — BP 159/87 | HR 94 | Temp 98.0°F | Resp 18 | Wt 355.8 lb

## 2013-02-17 DIAGNOSIS — G43909 Migraine, unspecified, not intractable, without status migrainosus: Secondary | ICD-10-CM | POA: Insufficient documentation

## 2013-02-17 DIAGNOSIS — Z8669 Personal history of other diseases of the nervous system and sense organs: Secondary | ICD-10-CM

## 2013-02-17 DIAGNOSIS — J329 Chronic sinusitis, unspecified: Secondary | ICD-10-CM

## 2013-02-17 DIAGNOSIS — M25511 Pain in right shoulder: Secondary | ICD-10-CM | POA: Insufficient documentation

## 2013-02-17 DIAGNOSIS — E785 Hyperlipidemia, unspecified: Secondary | ICD-10-CM | POA: Insufficient documentation

## 2013-02-17 DIAGNOSIS — M25519 Pain in unspecified shoulder: Secondary | ICD-10-CM | POA: Insufficient documentation

## 2013-02-17 LAB — LIPID PANEL
Cholesterol: 247 mg/dL — ABNORMAL HIGH (ref 0–200)
HDL: 63 mg/dL (ref >39)
Total CHOL/HDL Ratio: 3.9 Ratio
Triglycerides: 127 mg/dL (ref <150)

## 2013-02-17 MED ORDER — BUTALBITAL-APAP-CAFFEINE 50-325-40 MG PO TABS: ORAL_TABLET | ORAL | Status: DC

## 2013-02-17 MED ORDER — HYDROCODONE-ACETAMINOPHEN 10-325 MG PO TABS: 1.0000 | ORAL_TABLET | Freq: Three times a day (TID) | ORAL | Status: DC | PRN

## 2013-02-17 NOTE — Patient Instructions (Signed)
Headache Headaches are caused by many different problems. Most commonly, headache is caused by muscle tension from an injury, fatigue, or emotional upset. Excessive muscle contractions in the scalp and neck result in a headache that often feels like a tight band around the head. Tension headaches often have areas of tenderness over the scalp and the back of the neck. These headaches may last for hours, days, or longer, and some may contribute to migraines in those who have migraine problems. Migraines usually cause a throbbing headache, which is made worse by activity. Sometimes only one side of the head hurts. Nausea, vomiting, eye pain, and avoidance of food are common with migraines. Visual symptoms such as light sensitivity, blind spots, or flashing lights may also occur. Loud noises may worsen migraine headaches. Many factors may cause migraine headaches:  Emotional stress, lack of sleep, and menstrual periods.   Alcohol and some drugs (such as birth control pills).   Diet factors (fasting, caffeine, food preservatives, chocolate).   Environmental factors (weather changes, bright lights, odors, smoke).  Other causes of headaches include minor injuries to the head. Arthritis in the neck; problems with the jaw, eyes, ears, or nose are also causes of headaches. Allergies, drugs, alcohol, and exposure to smoke can also cause moderate headaches. Rebound headaches can occur if someone uses pain medications for a long period of time and then stops. Less commonly, blood vessel problems in the neck and brain (including stroke) can cause various types of headache. Treatment of headaches includes medicines for pain and relaxation. Ice packs or heat applied to the back of the head and neck help some people. Massaging the shoulders, neck and scalp are often very useful. Relaxation techniques and stretching can help prevent these headaches. Avoid alcohol and cigarette smoking as these tend to make headaches  worse. Please see your caregiver if your headache is not better in 2 days.  SEEK IMMEDIATE MEDICAL CARE IF:   You develop a high fever, chills, or repeated vomiting.   You faint or have difficulty with vision.   You develop unusual numbness or weakness of your arms or legs.   Relief of pain is inadequate with medication, or you develop severe pain.   You develop confusion, or neck stiffness.   You have a worsening of a headache or do not obtain relief.  Document Released: 08/31/2005 Document Revised: 08/20/2011 Document Reviewed: 02/24/2007 [employer] Patient Information 2012 [employer]. 

## 2013-02-17 NOTE — Progress Notes (Signed)
Patient ID: Terri Boyd, female   DOB: [birth year], 42 y.o.   MRN: [id number]  CC: Migraine headaches and right shoulder pain  HPI: 42 year-old female with past medical history of morbid obesity, migraine headaches who presented to clinic for followup. Patient reports her migraine headaches are not relieved with ibuprofen/Aleve. She also complains of right shoulder pain with activities but is able to move her shoulder in all range of motions. No other complaints.  No Known Allergies Past Medical History  Diagnosis Date  . Obesity   . Stroke   . DVT (deep venous thrombosis)   . High cholesterol   . Migraine   . Hypertension    Current Outpatient Prescriptions on File Prior to Visit  Medication Sig Dispense Refill  . albuterol (PROVENTIL HFA;VENTOLIN HFA) 108 (90 BASE) MCG/ACT inhaler Inhale 2 puffs into the lungs every 6 (six) hours as needed for shortness of breath.  1 Inhaler  1  . amoxicillin (AMOXIL) 500 MG capsule Take 2 capsules (1,000 mg total) by mouth 2 (two) times daily.  40 capsule  0  . Aspirin-Acetaminophen (GOODYS BODY PAIN PO) Take 1 packet by mouth once as needed. For pain      . ibuprofen (ADVIL,MOTRIN) 600 MG tablet Take 600 mg by mouth every 6 (six) hours as needed. For pain      . loratadine-pseudoephedrine (CLARITIN-D 24 HOUR) 10-240 MG per 24 hr tablet Take 1 tablet by mouth daily.  30 tablet  0   No current facility-administered medications on file prior to visit.   History reviewed. No pertinent family history. History   Social History  . Marital Status: Single    Spouse Name: N/A    Number of Children: N/A  . Years of Education: N/A   Occupational History  . Not on file.   Social History Main Topics  . Smoking status: Former Games developer  . Smokeless tobacco: Never Used  . Alcohol Use: Yes  . Drug Use: No  . Sexually Active: Not on file   Other Topics Concern  . Not on file   Social History Narrative  . No narrative on file   Family medical history  significant for HTN, HLD  Review of Systems  Constitutional: Negative for fever, chills, diaphoresis, activity change, appetite change and fatigue.  HENT: Negative for ear pain, nosebleeds, congestion, facial swelling, rhinorrhea, neck pain, neck stiffness and ear discharge.   Eyes: Negative for pain, discharge, redness, itching and visual disturbance.  Respiratory: Negative for cough, choking, chest tightness, shortness of breath, wheezing and stridor.   Cardiovascular: Negative for chest pain, palpitations and leg swelling.  Gastrointestinal: Negative for abdominal distention.  Genitourinary: Negative for dysuria, urgency, frequency, hematuria, flank pain, decreased urine volume, difficulty urinating and dyspareunia.  Musculoskeletal: Negative for back pain, joint swelling, arthralgias and gait problem.  Neurological: Negative for dizziness, tremors, seizures, syncope, facial asymmetry, speech difficulty, weakness, light-headedness, numbness and headaches.  Hematological: Negative for adenopathy. Does not bruise/bleed easily.  Psychiatric/Behavioral: Negative for hallucinations, behavioral problems, confusion, dysphoric mood, decreased concentration and agitation.    Objective:   Filed Vitals:   02/17/13 0939  BP: 159/87  Pulse: 94  Temp: 98 F (36.7 C)  Resp: 18    Physical Exam  Constitutional: Appears well-developed and well-nourished. No distress.  HENT: Normocephalic. External right and left ear normal. Oropharynx is clear and moist.  Eyes: Conjunctivae and EOM are normal. PERRLA, no scleral icterus.  Neck: Normal ROM. Neck supple. No JVD. No tracheal  deviation. No thyromegaly.  CVS: RRR, S1/S2 +, no murmurs, no gallops, no carotid bruit.  Pulmonary: Effort and breath sounds normal, no stridor, rhonchi, wheezes, rales.  Abdominal: Soft. BS +,  no distension, tenderness, rebound or guarding.  Musculoskeletal: Normal range of motion. No edema and no tenderness.   Lymphadenopathy: No lymphadenopathy noted, cervical, inguinal. Neuro: Alert. Normal reflexes, muscle tone coordination. No cranial nerve deficit. Skin: Skin is warm and dry. No rash noted. Not diaphoretic. No erythema. No pallor.  Psychiatric: Normal mood and affect. Behavior, judgment, thought content normal.   Lab Results  Component Value Date   WBC 7.3 07/22/2012   HGB 10.5* 07/22/2012   HCT 32.1* 07/22/2012   MCV 78.5 07/22/2012   PLT 332 07/22/2012   Lab Results  Component Value Date   CREATININE 0.90 07/22/2012   BUN 13 07/22/2012   NA 138 07/22/2012   K 3.5 07/22/2012   CL 101 07/22/2012   CO2 26 07/22/2012    Lab Results  Component Value Date   HGBA1C 5.8* 04/04/2011   Lipid Panel     Component Value Date/Time   CHOL 212* 04/05/2011 0616   TRIG 68 04/05/2011 0616   HDL 64 04/05/2011 0616   CHOLHDL 3.3 04/05/2011 0616   VLDL 14 04/05/2011 0616   LDLCALC 134* 04/05/2011 0616       Assessment and plan:   Patient Active Problem List   Diagnosis Date Noted  . Right shoulder pain 02/17/2013    Priority: Medium - Patient still has functional mobility in the right shoulder but reports pain with movement. We will prescribe Norco to see if she gets any pain relief.     Dyslipidemia - Last cholesterol panel was in 2012. Patient is not on any lipid-lowering agent.  - We will check lipid panel today    . Migraine     Priority: Medium - Will start Fioricet for migraine headaches. If her headaches do not improve may be on the next visit because parts sumatriptan

## 2013-02-17 NOTE — Progress Notes (Signed)
Patient here for follow up headaches.

## 2013-02-28 ENCOUNTER — Emergency Department (HOSPITAL_BASED_OUTPATIENT_CLINIC_OR_DEPARTMENT_OTHER): Payer: Self-pay

## 2013-02-28 ENCOUNTER — Encounter (HOSPITAL_BASED_OUTPATIENT_CLINIC_OR_DEPARTMENT_OTHER): Payer: Self-pay | Admitting: *Deleted

## 2013-02-28 ENCOUNTER — Emergency Department (HOSPITAL_BASED_OUTPATIENT_CLINIC_OR_DEPARTMENT_OTHER)
Admission: EM | Admit: 2013-02-28 | Discharge: 2013-02-28 | Disposition: A | Discharge status: Home / Self Care | Payer: Self-pay | Attending: Emergency Medicine | Admitting: Emergency Medicine

## 2013-02-28 DIAGNOSIS — M7989 Other specified soft tissue disorders: Secondary | ICD-10-CM

## 2013-02-28 DIAGNOSIS — M5412 Radiculopathy, cervical region: Secondary | ICD-10-CM

## 2013-02-28 DIAGNOSIS — R519 Headache, unspecified: Secondary | ICD-10-CM

## 2013-02-28 DIAGNOSIS — H5710 Ocular pain, unspecified eye: Secondary | ICD-10-CM | POA: Insufficient documentation

## 2013-02-28 DIAGNOSIS — Z8639 Personal history of other endocrine, nutritional and metabolic disease: Secondary | ICD-10-CM | POA: Insufficient documentation

## 2013-02-28 DIAGNOSIS — I10 Essential (primary) hypertension: Secondary | ICD-10-CM | POA: Insufficient documentation

## 2013-02-28 DIAGNOSIS — Z87891 Personal history of nicotine dependence: Secondary | ICD-10-CM | POA: Insufficient documentation

## 2013-02-28 DIAGNOSIS — Z8673 Personal history of transient ischemic attack (TIA), and cerebral infarction without residual deficits: Secondary | ICD-10-CM | POA: Insufficient documentation

## 2013-02-28 DIAGNOSIS — Z79899 Other long term (current) drug therapy: Secondary | ICD-10-CM | POA: Insufficient documentation

## 2013-02-28 DIAGNOSIS — G43909 Migraine, unspecified, not intractable, without status migrainosus: Secondary | ICD-10-CM | POA: Insufficient documentation

## 2013-02-28 DIAGNOSIS — Z86718 Personal history of other venous thrombosis and embolism: Secondary | ICD-10-CM | POA: Insufficient documentation

## 2013-02-28 DIAGNOSIS — Z862 Personal history of diseases of the blood and blood-forming organs and certain disorders involving the immune mechanism: Secondary | ICD-10-CM | POA: Insufficient documentation

## 2013-02-28 DIAGNOSIS — R51 Headache: Secondary | ICD-10-CM | POA: Insufficient documentation

## 2013-02-28 DIAGNOSIS — H538 Other visual disturbances: Secondary | ICD-10-CM | POA: Insufficient documentation

## 2013-02-28 LAB — CBC WITH DIFFERENTIAL/PLATELET
Basophils Absolute: 0 10*3/uL (ref 0.0–0.1)
Eosinophils Absolute: 0.1 10*3/uL (ref 0.0–0.7)
Eosinophils Relative: 2 % (ref 0–5)
MCH: 25.6 pg — ABNORMAL LOW (ref 26.0–34.0)
MCHC: 33 g/dL (ref 30.0–36.0)
MCV: 77.5 fL — ABNORMAL LOW (ref 78.0–100.0)
Platelets: 361 10*3/uL (ref 150–400)
RDW: 15.6 % — ABNORMAL HIGH (ref 11.5–15.5)

## 2013-02-28 LAB — COMPREHENSIVE METABOLIC PANEL
ALT: 16 U/L (ref 0–35)
AST: 16 U/L (ref 0–37)
Calcium: 9.3 mg/dL (ref 8.4–10.5)
Creatinine, Ser: 0.8 mg/dL (ref 0.50–1.10)
GFR calc Af Amer: >90 mL/min (ref >90)
Sodium: 136 mEq/L (ref 135–145)
Total Protein: 7.5 g/dL (ref 6.0–8.3)

## 2013-02-28 MED ORDER — PREDNISONE 20 MG PO TABS: 40.0000 mg | ORAL_TABLET | Freq: Every day | ORAL | Status: DC

## 2013-02-28 NOTE — ED Notes (Signed)
Pt was prescribed fiorcet for headache on 02/17/13 not taking states never filled it and reports has been instructed to take claritin d  Daily however she has not taken it lately either

## 2013-02-28 NOTE — ED Provider Notes (Addendum)
History     CSN: [id number]  Arrival date & time 02/28/13  0905   First MD Initiated Contact with Patient 02/28/13 0914      Chief Complaint  Patient presents with  . Headache  . Leg Swelling    (Consider location/radiation/quality/duration/timing/severity/associated sxs/prior treatment) HPI Comments: Secondly pt is here due to 2 days of bilateral leg swelling after a graduation where she was more active than normal with pain behind both legs.  States prior hx of DVT but no longer on anticoagulation.   Finally states persistent pain in the right shoulder and down her arm despite muscle rubs, pain meds and anti-inflammatories.  Denies any weakness, back pain, Chest or abd sx.  Patient is a 41 y.o. female presenting with headaches. The history is provided by the patient.  Headache Location: behind the left eye. Quality:  Dull and sharp Radiates to:  Does not radiate Severity currently:  2/10 Severity at highest:  8/10 Onset quality:  Gradual Duration: months. Timing:  Intermittent Progression:  Unchanged Chronicity:  Chronic Similar to prior headaches: yes   Context: not activity and not exposure to bright light   Context comment:  States yesterday for the first time she had some blurry vision in that eye which is now resolved Relieved by:  Nothing Worsened by:  Nothing tried Ineffective treatments:  None tried Associated symptoms: eye pain, facial pain and visual change   Associated symptoms: no abdominal pain, no congestion, no cough, no dizziness, no ear pain, no fever, no focal weakness, no loss of balance, no myalgias, no nausea, no numbness, no paresthesias, no photophobia, no sinus pressure, no syncope, no vomiting and no weakness     Past Medical History  Diagnosis Date  . Obesity   . Stroke   . DVT (deep venous thrombosis)   . High cholesterol   . Migraine   . Hypertension     Past Surgical History  Procedure Laterality Date  . Tubal ligation       History reviewed. No pertinent family history.  History  Substance Use Topics  . Smoking status: Former Games developer  . Smokeless tobacco: Never Used  . Alcohol Use: Yes    OB History   Grav Para Term Preterm Abortions TAB SAB Ect Mult Living                  Review of Systems  Constitutional: Positive for unexpected weight change. Negative for fever.  HENT: Negative for ear pain, congestion and sinus pressure.   Eyes: Positive for pain. Negative for photophobia.  Respiratory: Negative for cough.   Cardiovascular: Positive for leg swelling. Negative for chest pain, palpitations and syncope.  Gastrointestinal: Negative for nausea, vomiting and abdominal pain.  Musculoskeletal: Negative for myalgias.  Neurological: Positive for headaches. Negative for dizziness, focal weakness, speech difficulty, weakness, numbness, paresthesias and loss of balance.  All other systems reviewed and are negative.    Allergies  Review of patient's allergies indicates no known allergies.  Home Medications   Current Outpatient Rx  Name  Route  Sig  Dispense  Refill  . albuterol (PROVENTIL HFA;VENTOLIN HFA) 108 (90 BASE) MCG/ACT inhaler   Inhalation   Inhale 2 puffs into the lungs every 6 (six) hours as needed for shortness of breath.   1 Inhaler   1   . amoxicillin (AMOXIL) 500 MG capsule   Oral   Take 2 capsules (1,000 mg total) by mouth 2 (two) times daily.   40 capsule  0   . Aspirin-Acetaminophen (GOODYS BODY PAIN PO)   Oral   Take 1 packet by mouth once as needed. For pain         . butalbital-acetaminophen-caffeine (FIORICET) 50-325-40 MG per tablet      1-2 tabs up to every 4 hours as needed for pain. No more than 6 tabs in 24 hours.   45 tablet   0   . HYDROcodone-acetaminophen (NORCO) 10-325 MG per tablet   Oral   Take 1 tablet by mouth every 8 (eight) hours as needed for pain.   45 tablet   0   . ibuprofen (ADVIL,MOTRIN) 600 MG tablet   Oral   Take 600 mg by  mouth every 6 (six) hours as needed. For pain         . loratadine-pseudoephedrine (CLARITIN-D 24 HOUR) 10-240 MG per 24 hr tablet   Oral   Take 1 tablet by mouth daily.   30 tablet   0   . predniSONE (DELTASONE) 20 MG tablet   Oral   Take 2 tablets (40 mg total) by mouth daily.   10 tablet   0     BP 141/82  Pulse 73  Temp(Src) 98.5 F (36.9 C) (Oral)  Resp 24  SpO2 99%  LMP 02/21/2013  Physical Exam  Nursing note and vitals reviewed. Constitutional: She is oriented to person, place, and time. She appears well-developed and well-nourished. No distress.  Morbidly obese  HENT:  Head: Normocephalic and atraumatic.  Right Ear: Tympanic membrane and ear canal normal.  Left Ear: Tympanic membrane and ear canal normal.  Mouth/Throat: Oropharynx is clear and moist.  Eyes: Conjunctivae and EOM are normal. Pupils are equal, round, and reactive to light.  Neck: Normal range of motion. Neck supple.  Cardiovascular: Normal rate, regular rhythm and intact distal pulses.   No murmur heard. Pulmonary/Chest: Effort normal and breath sounds normal. No respiratory distress. She has no wheezes. She has no rales.  Abdominal: Soft. She exhibits no distension. There is no tenderness. There is no rebound and no guarding.  Musculoskeletal: Normal range of motion. She exhibits edema and tenderness.       Back:  1+ nonpitting edema in bilateral lower ext with bilateral proximal calf pain.  Neurological: She is alert and oriented to person, place, and time.  Skin: Skin is warm and dry. No rash noted. No erythema.  Psychiatric: She has a normal mood and affect. Her behavior is normal.    ED Course  Procedures (including critical care time)  Labs Reviewed  CBC WITH DIFFERENTIAL - Abnormal; Notable for the following:    Hemoglobin 10.9 (*)    HCT 33.0 (*)    MCV 77.5 (*)    MCH 25.6 (*)    RDW 15.6 (*)    All other components within normal limits  COMPREHENSIVE METABOLIC PANEL -  Abnormal; Notable for the following:    Glucose, Bld 115 (*)    Albumin 3.3 (*)    Alkaline Phosphatase 121 (*)    Total Bilirubin 0.2 (*)    All other components within normal limits  PRO B NATRIURETIC PEPTIDE   Ct Head Wo Contrast  02/28/2013   *RADIOLOGY REPORT*  Clinical Data: Headache and left eye pain.  CT HEAD WITHOUT CONTRAST  Technique:  Contiguous axial images were obtained from the base of the skull through the vertex without contrast.  Comparison: 12/08/2011.  Findings: The ventricles are normal.  No extra-axial fluid collections are seen.  The brainstem and cerebellum are unremarkable.  No acute intracranial findings such as infarction or hemorrhage.  No mass lesions.  The bony calvarium is intact.  The visualized paranasal sinuses and mastoid air cells are clear.  IMPRESSION: No acute intracranial findings or mass lesions.   Original Report Authenticated By: Rudie Meyer, M.D.   US Venous Img Lower Bilateral  02/28/2013   *RADIOLOGY REPORT*  Bilateral lower extremity venous duplex ultrasound  History:  Bilateral lower extremity swelling and pain  Technique:  Real-time and Doppler interrogation of both lower extremity venous systems was performed.  Findings:  Visualization of both distal superficial femoral veins as well as the veins below the knee is limited due to body habitus. Areas that can be well visualized bilaterally show spontaneous and phasic flow in all segments.  Areas that can be compressed and augmented show normal compression and augmentation in both lower extremities.  Venous Doppler signal is normal in appearance in all visualized regions.  There is no thrombus in the visualized deep or superficial venous structures on either side.  There is no deep venous incompetence seen on either side.  Conclusion:  Areas of limited visualization due to the patient's body habitus.  Specifically, the distal superficial femoral veins as well as in the veins below the knee are less than  optimally visualized due to the patient's body habitus.  Given these limitations, the study is otherwise unremarkable.  Note that there is no deep venous thrombosis appreciated in the areas that can be adequately interrogated on either side.   Original Report Authenticated By: Bretta Bang, M.D.     1. Headache   2. Leg swelling   3. Cervical radiculopathy       MDM   Patient here complaining of bilateral lower term the swelling for the last 2-3 days with pain in the back of her legs with a history of DVT no longer on anticoagulation. She denies any chest pain or shortness of breath but is morbidly obese. No prior cardiac issues. CBC, CMP and BNP are within normal limits. No rales or lung findings on exam low suspicion for CHF. Also electrolytes are within normal limits bilateral duplex negative.  Secondly patient is complaining of an intermittent headache behind her left eye that started months ago which continues to worsen but is intermittent. She has a states that sometimes it causes blurry vision. She has no temporal artery tenderness concerning for temporal arteritis also she would be very young to have this diagnosis. She states she has not seen an eye doctor regularly and her glasses prescription has not changed. The features are not classic for migraine headache and head CT negative for lesion. Currently not complaining of any visual problems. Recommended that patient followup with ophthalmology.  Third patient states that she has pain in her right upper arm consistent with cervical radiculopathy. She states she's been taking pain medication by her PCP but the pain is not resolving. We'll give prednisone to see if that helps her symptoms and recommended follow up with PCP for thyroid testing to make sure that that's causing her above symptoms and 60 pound weight gain in the last 6 months.        Gwyneth Sprout, MD 02/28/13 1536  Gwyneth Sprout, MD 02/28/13 1536

## 2013-02-28 NOTE — ED Notes (Signed)
Continues to have headache behind eye states swelling in feet and lower legs is new noticed on Saturday and having leg pain bilaterally

## 2013-03-22 ENCOUNTER — Ambulatory Visit: Payer: Self-pay

## 2013-10-04 ENCOUNTER — Emergency Department (HOSPITAL_BASED_OUTPATIENT_CLINIC_OR_DEPARTMENT_OTHER)
Admission: EM | Admit: 2013-10-04 | Discharge: 2013-10-05 | Disposition: A | Discharge status: Home / Self Care | Payer: Self-pay | Attending: Emergency Medicine | Admitting: Emergency Medicine

## 2013-10-04 ENCOUNTER — Emergency Department (HOSPITAL_BASED_OUTPATIENT_CLINIC_OR_DEPARTMENT_OTHER): Payer: Self-pay

## 2013-10-04 ENCOUNTER — Encounter (HOSPITAL_BASED_OUTPATIENT_CLINIC_OR_DEPARTMENT_OTHER): Payer: Self-pay | Admitting: Emergency Medicine

## 2013-10-04 DIAGNOSIS — Z86718 Personal history of other venous thrombosis and embolism: Secondary | ICD-10-CM | POA: Insufficient documentation

## 2013-10-04 DIAGNOSIS — Z87891 Personal history of nicotine dependence: Secondary | ICD-10-CM | POA: Insufficient documentation

## 2013-10-04 DIAGNOSIS — IMO0002 Reserved for concepts with insufficient information to code with codable children: Secondary | ICD-10-CM | POA: Insufficient documentation

## 2013-10-04 DIAGNOSIS — Z792 Long term (current) use of antibiotics: Secondary | ICD-10-CM | POA: Insufficient documentation

## 2013-10-04 DIAGNOSIS — E669 Obesity, unspecified: Secondary | ICD-10-CM | POA: Insufficient documentation

## 2013-10-04 DIAGNOSIS — Z79899 Other long term (current) drug therapy: Secondary | ICD-10-CM | POA: Insufficient documentation

## 2013-10-04 DIAGNOSIS — K5900 Constipation, unspecified: Secondary | ICD-10-CM | POA: Insufficient documentation

## 2013-10-04 DIAGNOSIS — Z3202 Encounter for pregnancy test, result negative: Secondary | ICD-10-CM | POA: Insufficient documentation

## 2013-10-04 DIAGNOSIS — Z8673 Personal history of transient ischemic attack (TIA), and cerebral infarction without residual deficits: Secondary | ICD-10-CM | POA: Insufficient documentation

## 2013-10-04 DIAGNOSIS — I10 Essential (primary) hypertension: Secondary | ICD-10-CM | POA: Insufficient documentation

## 2013-10-04 LAB — URINALYSIS, ROUTINE W REFLEX MICROSCOPIC
BILIRUBIN URINE: NEGATIVE
Glucose, UA: NEGATIVE mg/dL
Hgb urine dipstick: NEGATIVE
KETONES UR: NEGATIVE mg/dL
Leukocytes, UA: NEGATIVE
NITRITE: NEGATIVE
PH: 6 (ref 5.0–8.0)
PROTEIN: NEGATIVE mg/dL
Specific Gravity, Urine: 1.031 — ABNORMAL HIGH (ref 1.005–1.030)
Urobilinogen, UA: 1 mg/dL (ref 0.0–1.0)

## 2013-10-04 LAB — PREGNANCY, URINE: Preg Test, Ur: NEGATIVE

## 2013-10-04 NOTE — ED Notes (Signed)
Pt c/o lower left abd pain x 4 hrs denies urinary symptoms

## 2013-10-04 NOTE — ED Notes (Signed)
MD at bedside. 

## 2013-10-04 NOTE — ED Provider Notes (Signed)
CSN: [id number]     Arrival date & time 10/04/13  1934 History  This chart was scribed for Elisha Mcgruder Smitty CordsK Holly Iannaccone-Rasch, MD by Quintella ReichertMatthew Underwood, ED scribe.  This patient was seen in room MH06/MH06 and the patient's care was started at 11:02 PM.   Chief Complaint  Patient presents with  . Abdominal Pain    Patient is a 43 y.o. female presenting with abdominal pain. The history is provided by the patient. No language interpreter was used.  Abdominal Pain Pain location:  LLQ Pain quality: cramping   Pain radiates to:  Does not radiate Pain severity:  Moderate Onset quality:  Gradual Duration:  2 weeks Timing:  Sporadic Progression:  Unchanged Chronicity:  New Context: not awakening from sleep, not retching and not suspicious food intake   Relieved by:  Nothing Worsened by:  Nothing tried Ineffective treatments:  None tried Associated symptoms: no diarrhea, no dysuria and no vomiting   Risk factors: no recent hospitalization     HPI Comments: Terri Boyd is a 43 y.o. female who presents to the Emergency Department complaining of 2 weeks of intermittent LLQ abdominal pain.  Pt states pain comes on suddenly and lasts around an hour before it resolves.  Around 4 hours ago she also developed an episode of sharp lower abdominal pain which was new and prompted her to come to the ED.  Pt has not attempted to treat pain pta.  LNMP was 2 weeks ago and was normal.  She denies vomiting, diarrhea, constipation, urinary symptoms.  She denies recent injuries or unusual activities that may have brought on pain.  She has not seen her PCP over her symptoms.  Pt is in the ED with multiple family members in separate rooms.   Past Medical History  Diagnosis Date  . Obesity   . Stroke   . DVT (deep venous thrombosis)   . High cholesterol   . Migraine   . Hypertension     Past Surgical History  Procedure Laterality Date  . Tubal ligation      History reviewed. No pertinent family  history.   History  Substance Use Topics  . Smoking status: Former Games developermoker  . Smokeless tobacco: Never Used  . Alcohol Use: Yes    OB History   Grav Para Term Preterm Abortions TAB SAB Ect Mult Living                  Review of Systems  Gastrointestinal: Positive for abdominal pain. Negative for vomiting, diarrhea and anal bleeding.  Genitourinary: Negative for dysuria, flank pain and difficulty urinating.  All other systems reviewed and are negative.     Allergies  Review of patient's allergies indicates no known allergies.  Home Medications   Current Outpatient Rx  Name  Route  Sig  Dispense  Refill  . albuterol (PROVENTIL HFA;VENTOLIN HFA) 108 (90 BASE) MCG/ACT inhaler   Inhalation   Inhale 2 puffs into the lungs every 6 (six) hours as needed for shortness of breath.   1 Inhaler   1   . amoxicillin (AMOXIL) 500 MG capsule   Oral   Take 2 capsules (1,000 mg total) by mouth 2 (two) times daily.   40 capsule   0   . Aspirin-Acetaminophen (GOODYS BODY PAIN PO)   Oral   Take 1 packet by mouth once as needed. For pain         . butalbital-acetaminophen-caffeine (FIORICET) 50-325-40 MG per tablet  1-2 tabs up to every 4 hours as needed for pain. No more than 6 tabs in 24 hours.   45 tablet   0   . HYDROcodone-acetaminophen (NORCO) 10-325 MG per tablet   Oral   Take 1 tablet by mouth every 8 (eight) hours as needed for pain.   45 tablet   0   . ibuprofen (ADVIL,MOTRIN) 600 MG tablet   Oral   Take 600 mg by mouth every 6 (six) hours as needed. For pain         . loratadine-pseudoephedrine (CLARITIN-D 24 HOUR) 10-240 MG per 24 hr tablet   Oral   Take 1 tablet by mouth daily.   30 tablet   0   . predniSONE (DELTASONE) 20 MG tablet   Oral   Take 2 tablets (40 mg total) by mouth daily.   10 tablet   0    BP 160/84  Pulse 95  Temp(Src) 98.6 F (37 C)  Resp 16  Ht 5\' 4"  (1.626 m)  Wt 320 lb (145.151 kg)  BMI 54.90 kg/m2  SpO2 100%  LMP  09/30/2013  Physical Exam  Nursing note and vitals reviewed. Constitutional: She is oriented to person, place, and time. She appears well-developed and well-nourished. No distress.  HENT:  Head: Normocephalic and atraumatic.  Mouth/Throat: Oropharynx is clear and moist.  Eyes: EOM are normal.  Neck: Neck supple. No tracheal deviation present.  Cardiovascular: Normal rate, regular rhythm and normal heart sounds.   No murmur heard. Pulmonary/Chest: Effort normal and breath sounds normal. No respiratory distress. She has no wheezes. She has no rales.  Abdominal: Soft. Bowel sounds are normal. There is no tenderness. There is no rebound and no guarding.  Genitourinary: Vagina normal. No vaginal discharge found.  Chaperone present no cmt  Musculoskeletal: Normal range of motion.  Neurological: She is alert and oriented to person, place, and time.  Skin: Skin is warm and dry.  Psychiatric: She has a normal mood and affect. Her behavior is normal.    ED Course  Procedures (including critical care time)  DIAGNOSTIC STUDIES: Oxygen Saturation is 100% on room air, normal by my interpretation.    COORDINATION OF CARE: 11:06 PM-Discussed treatment plan which includes labs with pt at bedside and pt agreed to plan.    Labs Review Labs Reviewed  URINALYSIS, ROUTINE W REFLEX MICROSCOPIC - Abnormal; Notable for the following:    Specific Gravity, Urine 1.031 (*)    All other components within normal limits  PREGNANCY, URINE    Imaging Review No results found.   EKG Interpretation   None       MDM  No diagnosis found. Constipation and gas related to same.  Exam and vitals benign and reassuring.  Well appearing, no need for advanced imaging at this time   I personally performed the services described in this documentation, which was scribed in my presence. The recorded information has been reviewed and is accurate.    Jasmine Awe, MD 10/05/13 [phone]

## 2013-10-05 LAB — GC/CHLAMYDIA PROBE AMP
CT PROBE, AMP APTIMA: NEGATIVE
GC PROBE AMP APTIMA: NEGATIVE

## 2013-10-05 LAB — WET PREP, GENITAL
CLUE CELLS WET PREP: NONE SEEN
TRICH WET PREP: NONE SEEN
WBC, Wet Prep HPF POC: NONE SEEN
Yeast Wet Prep HPF POC: NONE SEEN

## 2013-10-05 MED ORDER — KETOROLAC TROMETHAMINE 60 MG/2ML IM SOLN
60.0000 mg | Freq: Once | INTRAMUSCULAR | Status: AC
  Administered 2013-10-05: 60 mg via INTRAMUSCULAR
  Filled 2013-10-05: qty 2

## 2013-10-05 NOTE — Discharge Instructions (Signed)
Constipation, Adult °Constipation is when a person: °· Poops (bowel movement) less than 3 times a week. °· Has a hard time pooping. °· Has poop that is dry, hard, or bigger than normal. °HOME CARE  °· Eat more fiber, such as fruits, vegetables, whole grains like brown rice, and beans. °· Eat less fatty foods and sugar. This includes French fries, hamburgers, cookies, candy, and soda. °· If you are not getting enough fiber from food, take products with added fiber in them (supplements). °· Drink enough fluid to keep your pee (urine) clear or pale yellow. °· Go to the restroom when you feel like you need to poop. Do not hold it. °· Only take medicine as told by your doctor. Do not take medicines that help you poop (laxatives) without talking to your doctor first. °· Exercise on a regular basis, or as told by your doctor. °GET HELP RIGHT AWAY IF:  °· You have bright red blood in your poop (stool). °· Your constipation lasts more than 4 days or gets worse. °· You have belly (abdomen) or butt (rectal) pain. °· You have thin poop (as thin as a pencil). °· You lose weight, and it cannot be explained. °MAKE SURE YOU:  °· Understand these instructions. °· Will watch your condition. °· Will get help right away if you are not doing well or get worse. °Document Released: 02/17/2008 Document Revised: 11/23/2011 Document Reviewed: 06/12/2013 °ExitCare® Patient Information ©2014 [employer]. ° °Fiber Content in Foods °Drinking plenty of fluids and consuming foods high in fiber can help with constipation. See the list below for the fiber content of some common foods. °Starches and Grains / Dietary Fiber (g) °· Cheerios, 1 cup / 3 g °· Kellogg's Corn Flakes, 1 cup / 0.7 g °· Rice Krispies, 1 ¼ cup / 0.3 g °· Quaker Oat Life Cereal, ¾ cup / 2.1 g °· Oatmeal, instant (cooked), ½ cup / 2 g °· Kell[employer]'s Frosted Mini Wheats, 1 cup / 5.1 g °· Rice, brown, long-grain (cooked), 1 cup / 3.5 g °· Rice, white, long-grain (cooked), 1 cup /  0.6 g °· Macaroni, cooked, enriched, 1 cup / 2.5 g °Legumes / Dietary Fiber (g) °· Beans, baked, canned, plain or vegetarian, ½ cup / 5.2 g °· Beans, kidney, canned, ½ cup / 6.8 g °· Beans, pinto, dried (cooked), ½ cup / 7.7 g °· Beans, pinto, canned, ½ cup / 5.5 g °Breads and Crackers / Dietary Fiber (g) °· Graham crackers, plain or honey, 2 squares / 0.7 g °· Saltine crackers, 3 squares / 0.3 g °· Pretzels, plain, salted, 10 pieces / 1.8 g °· Bread, whole-wheat, 1 slice / 1.9 g °· Bread, white, 1 slice / 0.7 g °· Bread, raisin, 1 slice / 1.2 g °· Bagel, plain, 3 oz / 2 g °· Tortilla, flour, 1 oz / 0.9 g °· Tortilla, corn, 1 small / 1.5 g °· Bun, hamburger or hotdog, 1 small / 0.9 g °Fruits / Dietary Fiber (g) °· Apple, raw with skin, 1 medium / 4.4 g °· Applesauce, sweetened, ½ cup / 1.5 g °· Banana, ½ medium / 1.5 g °· Grapes, 10 grapes / 0.4 g °· Orange, 1 small / 2.3 g °· Raisin, 1.5 oz / 1.6 g °· Melon, 1 cup / 1.4 g °Vegetables / Dietary Fiber (g) °· Green beans, canned, ½ cup / 1.3 g °· Carrots (cooked), ½ cup / 2.3 g °· Broccoli (cooked), ½ cup / 2.8 g °· Peas,   frozen (cooked), ½ cup / 4.4 g °· Potatoes, mashed, ½ cup / 1.6 g °· Lettuce, 1 cup / 0.5 g °· Corn, canned, ½ cup / 1.6 g °· Tomato, ½ cup / 1.1 g °Document Released: 01/17/2007 Document Revised: 11/23/2011 Document Reviewed: 03/14/2007 °ExitCare® Patient Information ©2014 [employer]. ° °

## 2014-01-11 ENCOUNTER — Encounter (HOSPITAL_COMMUNITY): Payer: Self-pay | Admitting: Emergency Medicine

## 2014-01-11 ENCOUNTER — Emergency Department (HOSPITAL_COMMUNITY)
Admission: EM | Admit: 2014-01-11 | Discharge: 2014-01-11 | Disposition: A | Discharge status: Home / Self Care | Payer: Self-pay | Attending: Emergency Medicine | Admitting: Emergency Medicine

## 2014-01-11 DIAGNOSIS — Z87891 Personal history of nicotine dependence: Secondary | ICD-10-CM | POA: Insufficient documentation

## 2014-01-11 DIAGNOSIS — I10 Essential (primary) hypertension: Secondary | ICD-10-CM | POA: Insufficient documentation

## 2014-01-11 DIAGNOSIS — Z791 Long term (current) use of non-steroidal anti-inflammatories (NSAID): Secondary | ICD-10-CM | POA: Insufficient documentation

## 2014-01-11 DIAGNOSIS — R209 Unspecified disturbances of skin sensation: Secondary | ICD-10-CM | POA: Insufficient documentation

## 2014-01-11 DIAGNOSIS — Z86718 Personal history of other venous thrombosis and embolism: Secondary | ICD-10-CM | POA: Insufficient documentation

## 2014-01-11 DIAGNOSIS — Z7982 Long term (current) use of aspirin: Secondary | ICD-10-CM | POA: Insufficient documentation

## 2014-01-11 DIAGNOSIS — Z8673 Personal history of transient ischemic attack (TIA), and cerebral infarction without residual deficits: Secondary | ICD-10-CM | POA: Insufficient documentation

## 2014-01-11 DIAGNOSIS — M6281 Muscle weakness (generalized): Secondary | ICD-10-CM | POA: Insufficient documentation

## 2014-01-11 DIAGNOSIS — E669 Obesity, unspecified: Secondary | ICD-10-CM | POA: Insufficient documentation

## 2014-01-11 DIAGNOSIS — Z79899 Other long term (current) drug therapy: Secondary | ICD-10-CM | POA: Insufficient documentation

## 2014-01-11 DIAGNOSIS — G43909 Migraine, unspecified, not intractable, without status migrainosus: Secondary | ICD-10-CM | POA: Insufficient documentation

## 2014-01-11 MED ORDER — METOCLOPRAMIDE HCL 5 MG/ML IJ SOLN
10.0000 mg | Freq: Once | INTRAMUSCULAR | Status: AC
  Administered 2014-01-11: 10 mg via INTRAVENOUS
  Filled 2014-01-11: qty 2

## 2014-01-11 MED ORDER — VALPROATE SODIUM 500 MG/5ML IV SOLN
500.0000 mg | Freq: Once | INTRAVENOUS | Status: AC
  Administered 2014-01-11: 500 mg via INTRAVENOUS
  Filled 2014-01-11: qty 5

## 2014-01-11 MED ORDER — ONDANSETRON 4 MG PO TBDP: 4.0000 mg | ORAL_TABLET | Freq: Three times a day (TID) | ORAL | Status: DC | PRN

## 2014-01-11 MED ORDER — ONDANSETRON HCL 4 MG/2ML IJ SOLN
4.0000 mg | Freq: Once | INTRAMUSCULAR | Status: AC
  Administered 2014-01-11: 4 mg via INTRAVENOUS
  Filled 2014-01-11: qty 2

## 2014-01-11 MED ORDER — SUMATRIPTAN SUCCINATE 25 MG PO TABS: 25.0000 mg | ORAL_TABLET | ORAL | Status: DC | PRN

## 2014-01-11 NOTE — Discharge Instructions (Signed)
Migraine Headache A migraine headache is an intense, throbbing pain on one or both sides of your head. A migraine can last for 30 minutes to several hours. CAUSES  The exact cause of a migraine headache is not always known. However, a migraine may be caused when nerves in the brain become irritated and release chemicals that cause inflammation. This causes pain. Certain things may also trigger migraines, such as:  Alcohol.  Smoking.  Stress.  Menstruation.  Aged cheeses.  Foods or drinks that contain nitrates, glutamate, aspartame, or tyramine.  Lack of sleep.  Chocolate.  Caffeine.  Hunger.  Physical exertion.  Fatigue.  Medicines used to treat chest pain (nitroglycerine), birth control pills, estrogen, and some blood pressure medicines. SIGNS AND SYMPTOMS  Pain on one or both sides of your head.  Pulsating or throbbing pain.  Severe pain that prevents daily activities.  Pain that is aggravated by any physical activity.  Nausea, vomiting, or both.  Dizziness.  Pain with exposure to bright lights, loud noises, or activity.  General sensitivity to bright lights, loud noises, or smells. Before you get a migraine, you may get warning signs that a migraine is coming (aura). An aura may include:  Seeing flashing lights.  Seeing bright spots, halos, or zig-zag lines.  Having tunnel vision or blurred vision.  Having feelings of numbness or tingling.  Having trouble talking.  Having muscle weakness. DIAGNOSIS  A migraine headache is often diagnosed based on:  Symptoms.  Physical exam.  A CT scan or MRI of your head. These imaging tests cannot diagnose migraines, but they can help rule out other causes of headaches. TREATMENT Medicines may be given for pain and nausea. Medicines can also be given to help prevent recurrent migraines.  HOME CARE INSTRUCTIONS  Only take over-the-counter or prescription medicines for pain or discomfort as directed by your  health care provider. The use of long-term narcotics is not recommended.  Lie down in a dark, quiet room when you have a migraine.  Keep a journal to find out what may trigger your migraine headaches. For example, write down:  What you eat and drink.  How much sleep you get.  Any change to your diet or medicines.  Limit alcohol consumption.  Quit smoking if you smoke.  Get 7 9 hours of sleep, or as recommended by your health care provider.  Limit stress.  Keep lights dim if bright lights bother you and make your migraines worse. SEEK IMMEDIATE MEDICAL CARE IF:   Your migraine becomes severe.  You have a fever.  You have a stiff neck.  You have vision loss.  You have muscular weakness or loss of muscle control.  You start losing your balance or have trouble walking.  You feel faint or pass out.  You have severe symptoms that are different from your first symptoms. MAKE SURE YOU:   Understand these instructions.  Will watch your condition.  Will get help right away if you are not doing well or get worse. Document Released: 08/31/2005 Document Revised: 06/21/2013 Document Reviewed: 05/08/2013 ExitCare Patient Information 2014 [employer].  

## 2014-01-11 NOTE — ED Notes (Signed)
43 yo female presents via EMS with c/o migraine headache. Reports waking up with migraine taking Excedrin and a nap, reported to work as normal and began having Left lower extremity weakness. Had difficulty walking but currently able to bare weight. HX of TIA in 2012.  Denies LOC but has nausea. Pain 10/10 in the head. Vitals are stable and 12 Lead unremarkable.

## 2014-01-11 NOTE — ED Notes (Signed)
Computer in room froze at time of discharge, unable to obtain pt's signature. PT states she understands her discharge instructions and how to take her prescriptions.

## 2014-01-11 NOTE — ED Provider Notes (Signed)
CSN: [id number]     Arrival date & time 01/11/14  1507 History   First MD Initiated Contact with Patient 01/11/14 1542     Chief Complaint  Patient presents with  . Migraine  . Extremity Weakness      HPI  Presents with a headache and improving left arm and leg symptoms. She has history of migraine headaches. She also describes a history of TIA. She states that she's had migraine headaches before they have given her extremity weakness and tingling. Genitals or where her arm felt tingling and weak he did not have a headache. She was told it may have been a TIA. She waking this morning with a headache described as left-sided and throbbing. She is photosensitive and nauseated as is typical for her with her migraines. At work her left arm and her left leg felt weak. She was able to stand but felt like she could not walk. Given by EMS. Nausea but no vomiting. No fevers. No head or neck injuries.  Past Medical History  Diagnosis Date  . Obesity   . Stroke   . DVT (deep venous thrombosis)   . High cholesterol   . Migraine   . Hypertension    Past Surgical History  Procedure Laterality Date  . Tubal ligation     No family history on file. History  Substance Use Topics  . Smoking status: Former Games developermoker  . Smokeless tobacco: Never Used  . Alcohol Use: Yes     Comment: occaisional    OB History   Grav Para Term Preterm Abortions TAB SAB Ect Mult Living                 Review of Systems  Constitutional: Negative for fever, chills, diaphoresis, appetite change and fatigue.  HENT: Negative for mouth sores, sore throat and trouble swallowing.   Eyes: Negative for visual disturbance.  Respiratory: Negative for cough, chest tightness, shortness of breath and wheezing.   Cardiovascular: Negative for chest pain.  Gastrointestinal: Negative for nausea, vomiting, abdominal pain, diarrhea and abdominal distention.  Endocrine: Negative for polydipsia, polyphagia and polyuria.  Genitourinary:  Negative for dysuria, frequency and hematuria.  Musculoskeletal: Negative for gait problem.  Skin: Negative for color change, pallor and rash.  Neurological: Positive for weakness and headaches. Negative for dizziness, syncope and light-headedness.  Hematological: Does not bruise/bleed easily.  Psychiatric/Behavioral: Negative for behavioral problems and confusion.      Allergies  Review of patient's allergies indicates no known allergies.  Home Medications   Prior to Admission medications   Medication Sig Start Date End Date Taking? Authorizing Provider  aspirin-acetaminophen-caffeine (EXCEDRIN MIGRAINE) [phone] MG per tablet Take 2 tablets by mouth every 6 (six) hours as needed for headache.   Yes Historical Provider, MD  ibuprofen (ADVIL,MOTRIN) 600 MG tablet Take 600 mg by mouth every 6 (six) hours as needed. For pain   Yes Historical Provider, MD  ORSYTHIA 0.1-20 MG-MCG tablet Take 1 tablet by mouth daily. 01/05/14  Yes Historical Provider, MD  ondansetron (ZOFRAN ODT) 4 MG disintegrating tablet Take 1 tablet (4 mg total) by mouth every 8 (eight) hours as needed for nausea. 01/11/14   Rolland PorterMark Justene Jensen, MD  SUMAtriptan (IMITREX) 25 MG tablet Take 1 tablet (25 mg total) by mouth every 2 (two) hours as needed for migraine or headache. May repeat in 2 hours if headache persists or recurs. 01/11/14   Rolland PorterMark Ada Holness, MD   BP 156/79  Pulse 68  Temp(Src) 98.1 F (36.7  C) (Oral)  Resp 25  SpO2 100% Physical Exam  Constitutional: She is oriented to person, place, and time. She appears well-developed and well-nourished. No distress.  HENT:  Head: Normocephalic.  She shaves her eyes from the light. Her pupils are equal and reactive 4 mm to 2 mm.  Eyes: Conjunctivae are normal. Pupils are equal, round, and reactive to light. No scleral icterus.  Neck: Normal range of motion. Neck supple. No thyromegaly present.  Cardiovascular: Normal rate and regular rhythm.  Exam reveals no gallop and no friction  rub.   No murmur heard. Pulmonary/Chest: Effort normal and breath sounds normal. No respiratory distress. She has no wheezes. She has no rales.  Abdominal: Soft. Bowel sounds are normal. She exhibits no distension. There is no tenderness. There is no rebound.  Musculoskeletal: Normal range of motion.  Neurological: She is alert and oriented to person, place, and time.  Normal cranial nerve exam. Normal strength the upper lower extremities. She's pronator drift or asymmetry. Symmetric sensation. She describes tingling to the left arm and leg. She feels like her strength is returned to normal.  Skin: Skin is warm and dry. No rash noted.  Psychiatric: She has a normal mood and affect. Her behavior is normal.    ED Course  Procedures (including critical care time) Labs Review Labs Reviewed - No data to display  Imaging Review No results found.   EKG Interpretation None      MDM   Final diagnoses:  Migraine headache    Patient has headache as left-sided and symptoms of her left-sided. Doubt TIA with pain. IV is placed. His given Zofran, Reglan, and valproic acid 500 mg IV. She becomes symptom free. Headache is resolved. Normal neurological exam. No tingling or weakness.  Plan is discharge home. Final diagnoses complex migraine headache.    Rolland PorterMark Kimber Fritts, MD 01/11/14 [phone]

## 2014-01-23 ENCOUNTER — Emergency Department (HOSPITAL_BASED_OUTPATIENT_CLINIC_OR_DEPARTMENT_OTHER)
Admission: EM | Admit: 2014-01-23 | Discharge: 2014-01-23 | Disposition: A | Discharge status: Home / Self Care | Payer: Self-pay | Attending: Emergency Medicine | Admitting: Emergency Medicine

## 2014-01-23 ENCOUNTER — Emergency Department (HOSPITAL_BASED_OUTPATIENT_CLINIC_OR_DEPARTMENT_OTHER): Payer: Self-pay

## 2014-01-23 ENCOUNTER — Encounter (HOSPITAL_BASED_OUTPATIENT_CLINIC_OR_DEPARTMENT_OTHER): Payer: Self-pay | Admitting: Emergency Medicine

## 2014-01-23 DIAGNOSIS — Z86718 Personal history of other venous thrombosis and embolism: Secondary | ICD-10-CM | POA: Insufficient documentation

## 2014-01-23 DIAGNOSIS — L819 Disorder of pigmentation, unspecified: Secondary | ICD-10-CM | POA: Insufficient documentation

## 2014-01-23 DIAGNOSIS — E669 Obesity, unspecified: Secondary | ICD-10-CM | POA: Insufficient documentation

## 2014-01-23 DIAGNOSIS — Z8673 Personal history of transient ischemic attack (TIA), and cerebral infarction without residual deficits: Secondary | ICD-10-CM | POA: Insufficient documentation

## 2014-01-23 DIAGNOSIS — Z79899 Other long term (current) drug therapy: Secondary | ICD-10-CM | POA: Insufficient documentation

## 2014-01-23 DIAGNOSIS — Z87891 Personal history of nicotine dependence: Secondary | ICD-10-CM | POA: Insufficient documentation

## 2014-01-23 DIAGNOSIS — G43909 Migraine, unspecified, not intractable, without status migrainosus: Secondary | ICD-10-CM | POA: Insufficient documentation

## 2014-01-23 DIAGNOSIS — I10 Essential (primary) hypertension: Secondary | ICD-10-CM | POA: Insufficient documentation

## 2014-01-23 LAB — TROPONIN I

## 2014-01-23 MED ORDER — SODIUM CHLORIDE 0.9 % IV BOLUS (SEPSIS)
1000.0000 mL | Freq: Once | INTRAVENOUS | Status: AC
  Administered 2014-01-23: 1000 mL via INTRAVENOUS

## 2014-01-23 MED ORDER — ONDANSETRON HCL 4 MG/2ML IJ SOLN
4.0000 mg | Freq: Once | INTRAMUSCULAR | Status: AC
  Administered 2014-01-23: 4 mg via INTRAVENOUS

## 2014-01-23 MED ORDER — NAPROXEN SODIUM 275 MG PO TABS: 275.0000 mg | ORAL_TABLET | Freq: Four times a day (QID) | ORAL | Status: DC | PRN

## 2014-01-23 MED ORDER — ONDANSETRON HCL 4 MG/2ML IJ SOLN
4.0000 mg | Freq: Once | INTRAMUSCULAR | Status: DC
  Filled 2014-01-23: qty 2

## 2014-01-23 MED ORDER — KETOROLAC TROMETHAMINE 30 MG/ML IJ SOLN
30.0000 mg | Freq: Once | INTRAMUSCULAR | Status: AC
  Administered 2014-01-23: 30 mg via INTRAVENOUS

## 2014-01-23 MED ORDER — TETRACAINE HCL 0.5 % OP SOLN
2.0000 [drp] | Freq: Once | OPHTHALMIC | Status: AC
  Administered 2014-01-23: 2 [drp] via OPHTHALMIC
  Filled 2014-01-23: qty 2

## 2014-01-23 MED ORDER — KETOROLAC TROMETHAMINE 30 MG/ML IJ SOLN
30.0000 mg | Freq: Once | INTRAMUSCULAR | Status: DC
  Filled 2014-01-23: qty 1

## 2014-01-23 NOTE — ED Notes (Signed)
Side Rails up, bed in lowest position. Call Bell within reach. Lights off. Patient resting.

## 2014-01-23 NOTE — ED Notes (Signed)
Headache and slight fever since 8pm last night. Hx of colonoscopy and endo yesterday. She took Tylenol without relief.

## 2014-01-23 NOTE — Discharge Instructions (Signed)
This was likely a migraine with tension features. Your head CT also showed signs of sinus thickening that could be contributing. This should get better with time without antibiotics. You should get your intraocular pressure rechecked. Here, it was 21 (left eye) and 16 (right eye).  If you develop new symptoms like neurologic changes, confusion, fevers, neck stiffness, or sudden worsening of your headache, seek immediate care.  Otherwise, stay hydrated, get your eyes checked, and take aleve as needed for your headache. If taking aleve (naproxen), do not take motrin or ibuprofen as these are similar medications. Also consider picking up imitrex and taking it at onset of migraine.  For your chest pain, your EKG and chest xrays were normal. It would be very beneficial to discuss cardiology follow up with your primary care doctor. I have also given you the number of a cardiologist to call and schedule an appointment in 1-2 weeks. Until then, avoid exertion. Seek immediate care if your chest pain worsens, occurs at rest, you develop dizziness, nausea, or shortness of breath with the pain.

## 2014-01-23 NOTE — ED Notes (Signed)
Patient transported to CT via stretcher per tech. 

## 2014-01-23 NOTE — ED Notes (Signed)
Patient transported to X-ray via stretcher per tech. 

## 2014-01-23 NOTE — ED Provider Notes (Signed)
CSN: [id number]     Arrival date & time 01/23/14  1135 History   First MD Initiated Contact with Patient 01/23/14 1154     Chief Complaint  Patient presents with  . Headache    HPI - Patient is a 43 y.o. female with h/o TIA, HTN, obesity, and migraines here with 2 days of headache starting yesterday 7pm, taking 1 hour to get to most severe. This headache is on her forehead with a "constant" pain that occasionally has a stabbing pain that comes out of either eye. Headache worsens to 7/10 when she is up, improves with laying down. Whenever she feels the stabbing, she notices the skin around eye becomes dark colored. She states this feels very different from her usual migraine symptoms, which include throbbing, photophobia, nausea, and phonophobia. She has none of these now. Migraine also usually resolves with tylenol and rest, but this has not resolved. This difference worries her. She denies unilateral tearing, sweating, difficulty with speech, gait change, vision or hearing change, or confusion, fever, chills, emesis, diarrhea, or abdominal pain. She was seen end of April for migraine and treated in ED. Sent home with rx for imitrex but did not pick this up. Has had TIA workup in 12/2010 due to left sided weakness with MRI MRA neg. Also had head CT 02/2013 done due to different headache quality with left-sided eye pain. She does not feel it much currently because she is laying down.  Also endorses substernal mild chest pain for the past few weeks when she goes up stairs, along with a "winded" feeling. Denies leg swelling, nausea, dizziness, or change in pain. Denies h/o stress test or cardiac workup. No chest pain currently.   Past Medical History  Diagnosis Date  . Obesity   . Stroke   . DVT (deep venous thrombosis)   . High cholesterol   . Migraine   . Hypertension    Past Surgical History  Procedure Laterality Date  . Tubal ligation     No family history on file. History  Substance Use  Topics  . Smoking status: Former Games developermoker  . Smokeless tobacco: Never Used  . Alcohol Use: Yes     Comment: occaisional   No tobacco or drug use. Intermittent alcohol (occasional glass of wine)  OB History   Grav Para Term Preterm Abortions TAB SAB Ect Mult Living                 Review of Systems  All other systems reviewed and are negative.   Allergies  Review of patient's allergies indicates no known allergies.  Home Medications   Prior to Admission medications   Medication Sig Start Date End Date Taking? Authorizing Provider  aspirin-acetaminophen-caffeine (EXCEDRIN MIGRAINE) [phone] MG per tablet Take 2 tablets by mouth every 6 (six) hours as needed for headache.    Historical Provider, MD  ibuprofen (ADVIL,MOTRIN) 600 MG tablet Take 600 mg by mouth every 6 (six) hours as needed. For pain    Historical Provider, MD  ondansetron (ZOFRAN ODT) 4 MG disintegrating tablet Take 1 tablet (4 mg total) by mouth every 8 (eight) hours as needed for nausea. 01/11/14   Rolland PorterMark James, MD  ORSYTHIA 0.1-20 MG-MCG tablet Take 1 tablet by mouth daily. 01/05/14   Historical Provider, MD  SUMAtriptan (IMITREX) 25 MG tablet Take 1 tablet (25 mg total) by mouth every 2 (two) hours as needed for migraine or headache. May repeat in 2 hours if headache persists or recurs. 01/11/14  Rolland PorterMark James, MD   BP 161/96  Pulse 80  Temp(Src) 98.2 F (36.8 C) (Oral)  Resp 18  Ht 5\' 4"  (1.626 m)  Wt 320 lb (145.151 kg)  BMI 54.90 kg/m2  SpO2 100%  LMP 01/01/2014 Physical Exam GEN: NAD HEENT: Atraumatic, normocephalic, neck supple, EOMI, sclera clear, PERRL, left around eye with hyperpigmentation but no swelling.  CV: RRR, no murmurs, rubs, or gallops PULM: CTAB, normal effort ABD: Obese, Soft, mild left sided tenderness, nondistended, NABS, no organomegaly SKIN: No rash or cyanosis; warm and well-perfused EXTR: No lower extremity edema or calf tenderness PSYCH: Mood and affect euthymic, normal rate and  volume of speech NEURO: Awake, alert, no focal deficits grossly, normal speech, CN 2-12 tested and intact, good core strength Intraocular pressure: Left 21, right 16  ED Course  Procedures (including critical care time) Labs Review Labs Reviewed  TROPONIN I    Imaging Review Dg Chest 2 View  01/23/2014   CLINICAL DATA:  Fever.  EXAM: CHEST  2 VIEW  COMPARISON:  10/04/2013 chest x-ray.  07/10/2013 CT.  FINDINGS: 4.5 mm CT detected left lower lobe pulmonary nodule not adequately evaluated by plain film exam. Please see prior CT report with regard to recommended follow-up.  Mild central pulmonary vascular prominence with minimal peribronchial thickening stable.  No infiltrate, congestive heart failure or pneumothorax.  Heart size top-normal.  IMPRESSION: 4.5 mm CT detected left lower lobe pulmonary nodule not adequately evaluated by plain film exam. Please see prior CT report with regard to recommended follow-up.  Mild central pulmonary vascular prominence with minimal peribronchial thickening stable.  No infiltrate, congestive heart failure or pneumothorax.   Electronically Signed   By: Bridgett LarssonSteve  Olson M.D.   On: 01/23/2014 13:39   Ct Head Wo Contrast  01/23/2014   CLINICAL DATA:  Frontal headache since last night. Left eye pain. No known injury. Migraines. High cholesterol. Hypertension. Obesity.  EXAM: CT HEAD WITHOUT CONTRAST  TECHNIQUE: Contiguous axial images were obtained from the base of the skull through the vertex without intravenous contrast.  COMPARISON:  02/28/2013 CT.  FINDINGS: Mucosal thickening with almost complete opacification right sphenoid sinus. Mucosal thickening posterior right ethmoid sinus air cell. No obvious intraorbital extension of sinus disease.  No intracranial hemorrhage.  No CT evidence of large acute infarct.  No intracranial mass lesion noted on this unenhanced exam.  Cerebellar tonsils minimally low lying but within range of normal limits and unchanged.  No  hydrocephalus.  Visualized orbital structures unremarkable.  IMPRESSION: Mucosal thickening with almost complete opacification right sphenoid sinus. Mucosal thickening posterior right ethmoid sinus air cell. Otherwise negative unenhanced head CT as noted above.   Electronically Signed   By: Bridgett LarssonSteve  Olson M.D.   On: 01/23/2014 13:59     EKG Interpretation None      MDM   Final diagnoses:  Migraine    43 y.o. female with constant headache since yesterday. Pt has h/o migraine, obesity, HTN, and TIA. No neurologic symptoms. Headache feels different than previous headaches per pt. DDx includes migraine, cluster headache, tension headache, subarachnoid hemorrhage, acute closed angle glaucoma, and dissection. No neurologic compromise on exam. No tearing characteristic of cluster headache. No fevers or neck stiffness.  - 1L NS bolus, IV toradol, zofran in ED - pt reports resolved symptoms. - Head CT due to different nature of headache - negative except sinus disease noted. Possibly contributing to headache. - Check intraocular pressure - left 21, right 16. Recheck outpatient but likely not  contributing to headache.  Chest pain intermittent, sounds like new stable angina. Not present currently. - EKG with t wave inversion in V1 that is stable compared to prior study. - Troponin negative. - CXR with no acute finding. - F/u with cardiologist for further workup. Information provided, asked pt to contact cards and if referral needed, contact PCP. Until then, instructed to avoid exertion.  Return precautions reviewed for chest pain and headache.  Leona Singleton, MD PGY-2, [facility]   Leona Singleton, MD 01/23/14 [phone]

## 2014-01-23 NOTE — ED Notes (Signed)
MD at bedside for ocular pressure measurement.

## 2014-01-23 NOTE — ED Notes (Signed)
Tono pen to bedside.

## 2014-01-24 NOTE — ED Provider Notes (Signed)
43 y.o. Female with headache bilaterally behind eyes with pressure across forehead.  Patient with normal neuro exam and history of migraines.  Notably there is no fever, neurological symptoms, neck pain, sudden onset, or other symptoms to suggest sah or cns infection.   I performed a history and physical examination of Terri Boyd and discussed her management with Dr. Benjamin Stainhekkekandam  I agree with the history, physical, assessment, and plan of care, with the following exceptions: None  I was present for the following procedures: None Time Spent in Critical Care of the patient: None Time spent in discussions with the patient and family: 15  Terri Boyd    Terri Quarryanielle S Allysen Lazo, MD 01/24/14 1530

## 2014-09-09 ENCOUNTER — Emergency Department (HOSPITAL_BASED_OUTPATIENT_CLINIC_OR_DEPARTMENT_OTHER): Payer: Self-pay

## 2014-09-09 ENCOUNTER — Emergency Department (HOSPITAL_BASED_OUTPATIENT_CLINIC_OR_DEPARTMENT_OTHER)
Admission: EM | Admit: 2014-09-09 | Discharge: 2014-09-09 | Disposition: A | Discharge status: Home / Self Care | Payer: Self-pay | Attending: Emergency Medicine | Admitting: Emergency Medicine

## 2014-09-09 ENCOUNTER — Encounter (HOSPITAL_BASED_OUTPATIENT_CLINIC_OR_DEPARTMENT_OTHER): Payer: Self-pay

## 2014-09-09 DIAGNOSIS — Z79899 Other long term (current) drug therapy: Secondary | ICD-10-CM | POA: Insufficient documentation

## 2014-09-09 DIAGNOSIS — I10 Essential (primary) hypertension: Secondary | ICD-10-CM | POA: Insufficient documentation

## 2014-09-09 DIAGNOSIS — J45909 Unspecified asthma, uncomplicated: Secondary | ICD-10-CM | POA: Insufficient documentation

## 2014-09-09 DIAGNOSIS — Z8639 Personal history of other endocrine, nutritional and metabolic disease: Secondary | ICD-10-CM | POA: Insufficient documentation

## 2014-09-09 DIAGNOSIS — Z87891 Personal history of nicotine dependence: Secondary | ICD-10-CM | POA: Insufficient documentation

## 2014-09-09 DIAGNOSIS — R059 Cough, unspecified: Secondary | ICD-10-CM

## 2014-09-09 DIAGNOSIS — R05 Cough: Secondary | ICD-10-CM | POA: Insufficient documentation

## 2014-09-09 DIAGNOSIS — G43909 Migraine, unspecified, not intractable, without status migrainosus: Secondary | ICD-10-CM | POA: Insufficient documentation

## 2014-09-09 DIAGNOSIS — Z86718 Personal history of other venous thrombosis and embolism: Secondary | ICD-10-CM | POA: Insufficient documentation

## 2014-09-09 DIAGNOSIS — Z8673 Personal history of transient ischemic attack (TIA), and cerebral infarction without residual deficits: Secondary | ICD-10-CM | POA: Insufficient documentation

## 2014-09-09 HISTORY — DX: Unspecified asthma, uncomplicated: J45.909

## 2014-09-09 MED ORDER — PREDNISONE 50 MG PO TABS
60.0000 mg | ORAL_TABLET | Freq: Once | ORAL | Status: AC
  Administered 2014-09-09: 60 mg via ORAL
  Filled 2014-09-09 (×2): qty 1

## 2014-09-09 MED ORDER — IPRATROPIUM-ALBUTEROL 0.5-2.5 (3) MG/3ML IN SOLN
3.0000 mL | RESPIRATORY_TRACT | Status: DC
  Administered 2014-09-09: 3 mL via RESPIRATORY_TRACT
  Filled 2014-09-09: qty 3

## 2014-09-09 MED ORDER — ALBUTEROL SULFATE HFA 108 (90 BASE) MCG/ACT IN AERS
1.0000 | INHALATION_SPRAY | RESPIRATORY_TRACT | Status: DC | PRN
  Administered 2014-09-09: 2 via RESPIRATORY_TRACT
  Filled 2014-09-09: qty 6.7

## 2014-09-09 MED ORDER — PROMETHAZINE-CODEINE 6.25-10 MG/5ML PO SYRP: 5.0000 mL | ORAL_SOLUTION | Freq: Four times a day (QID) | ORAL | Status: DC | PRN

## 2014-09-09 NOTE — ED Provider Notes (Signed)
CSN: [id number]     Arrival date & time 09/09/14  1319 History   First MD Initiated Contact with Patient 09/09/14 1522     Chief Complaint  Patient presents with  . Cough     (Consider location/radiation/quality/duration/timing/severity/associated sxs/prior Treatment) Patient is a 43 y.o. female presenting with cough. The history is provided by the patient and medical records.  Cough Associated symptoms: sore throat    This is a 43 y.o. F with PMH significant for obesity, asthma, HTN, prior DVT not currently requiring anticoagulation, presenting to the ED for cough and sore throat for the past month.  Patient states her cough is dry but "nagging", worse with sitting upright and moving around, better when lying flat.  She denies chest pain or shortness of breath but states "i don't feel like i am moving air like i should be."  Patient is not currently on any medications for her asthma. 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 She denies any fever or chills.  Slight tachycardia noted on arrival.  Past Medical History  Diagnosis Date  . Obesity   . Stroke   . DVT (deep venous thrombosis)   . High cholesterol   . Migraine   . Hypertension   . Asthma    Past Surgical History  Procedure Laterality Date  . Tubal ligation     No family history on file. History  Substance Use Topics  . Smoking status: Former Games developermoker  . Smokeless tobacco: Never Used  . Alcohol Use: Yes     Comment: occaisional    OB History    No data available     Review of Systems  HENT: Positive for sore throat.   Respiratory: Positive for cough.   All other systems reviewed and are negative.     Allergies  Review of patient's allergies indicates no known allergies.  Home Medications   Prior to Admission medications   Medication Sig Start Date End Date Taking? Authorizing Provider  aspirin-acetaminophen-caffeine (EXCEDRIN MIGRAINE) [phone] MG per tablet Take 2 tablets by  mouth every 6 (six) hours as needed for headache.    Historical Provider, MD  ibuprofen (ADVIL,MOTRIN) 600 MG tablet Take 600 mg by mouth every 6 (six) hours as needed. For pain    Historical Provider, MD  naproxen sodium (ANAPROX) 275 MG tablet Take 1 tablet (275 mg total) by mouth every 6 (six) hours as needed. 01/23/14   Leona SingletonMaria T Thekkekandam, MD  ondansetron (ZOFRAN ODT) 4 MG disintegrating tablet Take 1 tablet (4 mg total) by mouth every 8 (eight) hours as needed for nausea. 01/11/14   Rolland PorterMark James, MD  ORSYTHIA 0.1-20 MG-MCG tablet Take 1 tablet by mouth daily. 01/05/14   Historical Provider, MD  SUMAtriptan (IMITREX) 25 MG tablet Take 1 tablet (25 mg total) by mouth every 2 (two) hours as needed for migraine or headache. May repeat in 2 hours if headache persists or recurs. 01/11/14   Rolland PorterMark James, MD   BP 127/96 mmHg  Pulse 106  Temp(Src) 99 F (37.2 C)  Resp 20  Wt 320 lb (145.151 kg)  SpO2 100%   Physical Exam  Constitutional: She is oriented to person, place, and time. She appears well-developed and well-nourished.  Morbidly obese  HENT:  Head: Normocephalic and atraumatic.  Right Ear: Tympanic membrane and ear canal normal.  Left Ear: Tympanic membrane and ear canal normal.  Nose: Mucosal edema present.  Mouth/Throat: Uvula is midline, oropharynx is clear and  moist and mucous membranes are normal. No oropharyngeal exudate, posterior oropharyngeal edema, posterior oropharyngeal erythema or tonsillar abscesses.  Tonsils normal in appearance bilaterally without exudate; uvula midline without peritonsillar abscess; handling secretions appropriately; no difficulty swallowing or speaking  Eyes: Conjunctivae and EOM are normal. Pupils are equal, round, and reactive to light.  Neck: Normal range of motion.  Cardiovascular: Normal rate, regular rhythm and normal heart sounds.   Pulmonary/Chest: Effort normal. No respiratory distress. She has decreased breath sounds. She has no wheezes.   Decreased breath sounds throughout, respirations unlabored with accessory muscle use, speaking in full complete sentences without difficulty  Abdominal: Soft. Bowel sounds are normal.  Musculoskeletal: Normal range of motion.  Neurological: She is alert and oriented to person, place, and time.  Skin: Skin is warm and dry.  Psychiatric: She has a normal mood and affect.  Nursing note and vitals reviewed.   ED Course  Procedures (including critical care time) Labs Review Labs Reviewed - No data to display  Imaging Review Dg Chest 2 View  09/09/2014   CLINICAL DATA:  Cough for 1 month, wheezing, history hypertension, asthma, stroke, DVT, former smoker  EXAM: CHEST  2 VIEW  COMPARISON:  01/23/2014 No acute abnormalities.  FINDINGS: Upper normal heart size.  Mediastinal contours and pulmonary vascularity normal.  Decreased peribronchial thickening.  No acute infiltrate, pleural effusion or pneumothorax.  No acute osseous findings.  IMPRESSION: No active cardiopulmonary disease.   Electronically Signed   By: Ulyses SouthwardMark  Boles M.D.   On: 09/09/2014 14:00     EKG Interpretation None      MDM   Final diagnoses:  Cough   43 year old female with cough for the past month. History of asthma, not currently on any medications. No reported chest pain or shortness of breath. On exam, patient afebrile and nontoxic in appearance. She has decreased breath sounds throughout, but no audible wheezes or rhonchi. Chest x-ray was obtained which is negative for acute findings. Patient given nebulizer treatment with vast improvement of air movement. Patient does have history of DVT and is currently on birth control, however her symptoms are more consistent with viral etiology/bronchitis than PE.  Low suspicion for acute cardiac process at this time.  Patient discharged home with albuterol inhaler and cough medications.  FU with PCP encouraged.  Discussed plan with patient, he/she acknowledged understanding and agreed  with plan of care.  Return precautions given for new or worsening symptoms.  Of note, patient's tachycardia resolved prior to discharge without intervention.  Garlon HatchetLisa M Joelle Roswell, PA-C 09/09/14 1708  Mirian MoMatthew Gentry, MD 09/15/14 [phone]

## 2014-09-09 NOTE — ED Notes (Signed)
Patient here with cough that she relates to her asthma for the past month, not using her inhaler. Speaking full sentences and appears anxious

## 2014-09-09 NOTE — Discharge Instructions (Signed)
Take the prescribed medication as directed. °Follow-up with your primary care physician. °Return to the ED for new or worsening symptoms. ° °

## 2014-09-26 ENCOUNTER — Emergency Department (HOSPITAL_BASED_OUTPATIENT_CLINIC_OR_DEPARTMENT_OTHER)
Admission: EM | Admit: 2014-09-26 | Discharge: 2014-09-26 | Disposition: A | Discharge status: Home / Self Care | Payer: Self-pay | Attending: Emergency Medicine | Admitting: Emergency Medicine

## 2014-09-26 ENCOUNTER — Encounter (HOSPITAL_BASED_OUTPATIENT_CLINIC_OR_DEPARTMENT_OTHER): Payer: Self-pay

## 2014-09-26 DIAGNOSIS — I10 Essential (primary) hypertension: Secondary | ICD-10-CM | POA: Insufficient documentation

## 2014-09-26 DIAGNOSIS — Z86718 Personal history of other venous thrombosis and embolism: Secondary | ICD-10-CM | POA: Insufficient documentation

## 2014-09-26 DIAGNOSIS — Z8673 Personal history of transient ischemic attack (TIA), and cerebral infarction without residual deficits: Secondary | ICD-10-CM | POA: Insufficient documentation

## 2014-09-26 DIAGNOSIS — Z7982 Long term (current) use of aspirin: Secondary | ICD-10-CM | POA: Insufficient documentation

## 2014-09-26 DIAGNOSIS — Z87891 Personal history of nicotine dependence: Secondary | ICD-10-CM | POA: Insufficient documentation

## 2014-09-26 DIAGNOSIS — J4520 Mild intermittent asthma, uncomplicated: Secondary | ICD-10-CM

## 2014-09-26 DIAGNOSIS — E669 Obesity, unspecified: Secondary | ICD-10-CM | POA: Insufficient documentation

## 2014-09-26 DIAGNOSIS — G43909 Migraine, unspecified, not intractable, without status migrainosus: Secondary | ICD-10-CM | POA: Insufficient documentation

## 2014-09-26 DIAGNOSIS — Z79899 Other long term (current) drug therapy: Secondary | ICD-10-CM | POA: Insufficient documentation

## 2014-09-26 DIAGNOSIS — J029 Acute pharyngitis, unspecified: Secondary | ICD-10-CM

## 2014-09-26 DIAGNOSIS — J209 Acute bronchitis, unspecified: Secondary | ICD-10-CM

## 2014-09-26 DIAGNOSIS — Z8639 Personal history of other endocrine, nutritional and metabolic disease: Secondary | ICD-10-CM | POA: Insufficient documentation

## 2014-09-26 LAB — RAPID STREP SCREEN (MED CTR MEBANE ONLY): Streptococcus, Group A Screen (Direct): NEGATIVE

## 2014-09-26 MED ORDER — PREDNISONE 20 MG PO TABS: 40.0000 mg | ORAL_TABLET | Freq: Every day | ORAL | Status: DC

## 2014-09-26 MED ORDER — AMOXICILLIN 500 MG PO CAPS: 500.0000 mg | ORAL_CAPSULE | Freq: Three times a day (TID) | ORAL | Status: DC

## 2014-09-26 MED ORDER — ALBUTEROL SULFATE HFA 108 (90 BASE) MCG/ACT IN AERS: 2.0000 | INHALATION_SPRAY | RESPIRATORY_TRACT | Status: DC | PRN

## 2014-09-26 NOTE — ED Provider Notes (Signed)
CSN: [id number]     Arrival date & time 09/26/14  2019 History   This chart was scribed for Gilda Creasehristopher J. Pollina, MD by Abel PrestoKara Demonbreun, ED Scribe. This patient was seen in room MH04/MH04 and the patient's care was started at 9:13 PM.    Chief Complaint  Patient presents with  . Sore Throat      Patient is a 44 y.o. female presenting with pharyngitis. The history is provided by the patient. No language interpreter was used.  Sore Throat    HPI Comments: Terri Boyd is a 44 y.o. female who presents to the Emergency Department complaining of sore throat and congestion with onset today.  Pt notes associated cough for several months.  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   Past Medical History  Diagnosis Date  . Obesity   . Stroke   . DVT (deep venous thrombosis)   . High cholesterol   . Migraine   . Hypertension   . Asthma    Past Surgical History  Procedure Laterality Date  . Tubal ligation     No family history on file. History  Substance Use Topics  . Smoking status: Former Games developermoker  . Smokeless tobacco: Never Used  . Alcohol Use: Yes     Comment: occaisional    OB History    No data available     Review of Systems  HENT: Positive for congestion and sore throat.   Respiratory: Positive for cough.   All other systems reviewed and are negative.     Allergies  Review of patient's allergies indicates no known allergies.  Home Medications   Prior to Admission medications   Medication Sig Start Date End Date Taking? Authorizing Provider  aspirin-acetaminophen-caffeine (EXCEDRIN MIGRAINE) [id number] MG per tablet Take 2 tablets by mouth every 6 (six) hours as needed for headache.    Historical Provider, MD  ibuprofen (ADVIL,MOTRIN) 600 MG tablet Take 600 mg by mouth every 6 (six) hours as needed. For pain    Historical Provider, MD  naproxen sodium (ANAPROX) 275 MG tablet Take 1 tablet (275 mg total) by mouth every 6  (six) hours as needed. 01/23/14   Leona SingletonMaria T Thekkekandam, MD  ondansetron (ZOFRAN ODT) 4 MG disintegrating tablet Take 1 tablet (4 mg total) by mouth every 8 (eight) hours as needed for nausea. 01/11/14   Rolland PorterMark James, MD  ORSYTHIA 0.1-20 MG-MCG tablet Take 1 tablet by mouth daily. 01/05/14   Historical Provider, MD  promethazine-codeine (PHENERGAN WITH CODEINE) 6.25-10 MG/5ML syrup Take 5 mLs by mouth every 6 (six) hours as needed for cough. 09/09/14   Garlon HatchetLisa M Sanders, PA-C  SUMAtriptan (IMITREX) 25 MG tablet Take 1 tablet (25 mg total) by mouth every 2 (two) hours as needed for migraine or headache. May repeat in 2 hours if headache persists or recurs. 01/11/14   Rolland PorterMark James, MD   BP 185/116 mmHg  Pulse 92  Temp(Src) 98.6 F (37 C) (Oral)  Resp 22  Ht 5\' 4"  (1.626 m)  Wt 320 lb (145.151 kg)  BMI 54.90 kg/m2  SpO2 100%  LMP 09/23/2014 Physical Exam  Constitutional: She is oriented to person, place, and time. She appears well-developed and well-nourished. No distress.  HENT:  Head: Normocephalic and atraumatic.  Right Ear: Hearing normal.  Left Ear: Hearing normal.  Nose: Nose normal.  Mouth/Throat: Mucous membranes are normal. Posterior oropharyngeal erythema present.  Eyes: Conjunctivae and EOM are normal. Pupils are  equal, round, and reactive to light.  Neck: Normal range of motion. Neck supple.  Cardiovascular: Regular rhythm, S1 normal and S2 normal.  Exam reveals no gallop and no friction rub.   No murmur heard. Pulmonary/Chest: Effort normal and breath sounds normal. No respiratory distress. She exhibits no tenderness.  Abdominal: Soft. Normal appearance and bowel sounds are normal. There is no hepatosplenomegaly. There is no tenderness. There is no rebound, no guarding, no tenderness at McBurney's point and negative Murphy's sign. No hernia.  Musculoskeletal: Normal range of motion.  Neurological: She is alert and oriented to person, place, and time. She has normal strength. No cranial  nerve deficit or sensory deficit. Coordination normal. GCS eye subscore is 4. GCS verbal subscore is 5. GCS motor subscore is 6.  Skin: Skin is warm, dry and intact. No rash noted. No cyanosis.  Psychiatric: She has a normal mood and affect. Her speech is normal and behavior is normal. Thought content normal.  Nursing note and vitals reviewed.   ED Course  Procedures (including critical care time) DIAGNOSTIC STUDIES: Oxygen Saturation is 100% on room air, normal by my interpretation.    COORDINATION OF CARE: 9:14 PM Discussed treatment plan with patient at beside, the patient agrees with the plan and has no further questions at this time.   Labs Review Labs Reviewed  RAPID STREP SCREEN  CULTURE, GROUP A STREP    Imaging Review No results found.   EKG Interpretation None      MDM   Final diagnoses:  None   pharyngitis  Bronchitis   Asthma   Patient with a history of asthma presents to the ER with complaints of cough and congestion for one month. In the last 24 hours, however, she has developed a sore throat. She reports that she has been exposed to strep at her job. Rapid strep was negative, culture pending. Patient was seen in the ER 2 weeks ago with her upper respiratory symptoms, has not improved. She will be treated with prednisone, albuterol, amoxicillin.  I personally performed the services described in this documentation, which was scribed in my presence. The recorded information has been reviewed and is accurate.        Gilda Crease, MD 09/26/14 2135

## 2014-09-26 NOTE — Discharge Instructions (Signed)
Acute Bronchitis °Bronchitis is inflammation of the airways that extend from the windpipe into the lungs (bronchi). The inflammation often causes mucus to develop. This leads to a cough, which is the most common symptom of bronchitis.  °In acute bronchitis, the condition usually develops suddenly and goes away over time, usually in a couple weeks. Smoking, allergies, and asthma can make bronchitis worse. Repeated episodes of bronchitis may cause further lung problems.  °CAUSES °Acute bronchitis is most often caused by the same virus that causes a cold. The virus can spread from person to person (contagious) through coughing, sneezing, and touching contaminated objects. °SIGNS AND SYMPTOMS  °· Cough.   °· Fever.   °· Coughing up mucus.   °· Body aches.   °· Chest congestion.   °· Chills.   °· Shortness of breath.   °· Sore throat.   °DIAGNOSIS  °Acute bronchitis is usually diagnosed through a physical exam. Your health care provider will also ask you questions about your medical history. Tests, such as chest X-rays, are sometimes done to rule out other conditions.  °TREATMENT  °Acute bronchitis usually goes away in a couple weeks. Oftentimes, no medical treatment is necessary. Medicines are sometimes given for relief of fever or cough. Antibiotic medicines are usually not needed but may be prescribed in certain situations. In some cases, an inhaler may be recommended to help reduce shortness of breath and control the cough. A cool mist vaporizer may also be used to help thin bronchial secretions and make it easier to clear the chest.  °HOME CARE INSTRUCTIONS °· Get plenty of rest.   °· Drink enough fluids to keep your urine clear or pale yellow (unless you have a medical condition that requires fluid restriction). Increasing fluids may help thin your respiratory secretions (sputum) and reduce chest congestion, and it will prevent dehydration.   °· Take medicines only as directed by your health care provider. °· If  you were prescribed an antibiotic medicine, finish it all even if you start to feel better. °· Avoid smoking and secondhand smoke. Exposure to cigarette smoke or irritating chemicals will make bronchitis worse. If you are a smoker, consider using nicotine gum or skin patches to help control withdrawal symptoms. Quitting smoking will help your lungs heal faster.   °· Reduce the chances of another bout of acute bronchitis by washing your hands frequently, avoiding people with cold symptoms, and trying not to touch your hands to your mouth, nose, or eyes.   °· Keep all follow-up visits as directed by your health care provider.   °SEEK MEDICAL CARE IF: °Your symptoms do not improve after 1 week of treatment.  °SEEK IMMEDIATE MEDICAL CARE IF: °· You develop an increased fever or chills.   °· You have chest pain.   °· You have severe shortness of breath. °· You have bloody sputum.   °· You develop dehydration. °· You faint or repeatedly feel like you are going to pass out. °· You develop repeated vomiting. °· You develop a severe headache. °MAKE SURE YOU:  °· Understand these instructions. °· Will watch your condition. °· Will get help right away if you are not doing well or get worse. °Document Released: 10/08/2004 Document Revised: 01/15/2014 Document Reviewed: 02/21/2013 °Exit[employer]® Patient Information ©2015 [employer]. This information is not intended to replace advice given to you by your health care provider. Make sure you discuss any questions you have with your health care provider. °Pharyngitis °Pharyngitis is redness, pain, and swelling (inflammation) of your pharynx.  °CAUSES  °Pharyngitis is usually caused by infection. Most of the time,   these infections are from viruses (viral) and are part of a cold. However, sometimes pharyngitis is caused by bacteria (bacterial). Pharyngitis can also be caused by allergies. Viral pharyngitis may be spread from person to person by coughing, sneezing, and personal items or  utensils (cups, forks, spoons, toothbrushes). Bacterial pharyngitis may be spread from person to person by more intimate contact, such as kissing.  °SIGNS AND SYMPTOMS  °Symptoms of pharyngitis include:   °· Sore throat.   °· Tiredness (fatigue).   °· Low-grade fever.   °· Headache. °· Joint pain and muscle aches. °· Skin rashes. °· Swollen lymph nodes. °· Plaque-like film on throat or tonsils (often seen with bacterial pharyngitis). °DIAGNOSIS  °Your health care provider will ask you questions about your illness and your symptoms. Your medical history, along with a physical exam, is often all that is needed to diagnose pharyngitis. Sometimes, a rapid strep test is done. Other lab tests may also be done, depending on the suspected cause.  °TREATMENT  °Viral pharyngitis will usually get better in 3-4 days without the use of medicine. Bacterial pharyngitis is treated with medicines that kill germs (antibiotics).  °HOME CARE INSTRUCTIONS  °· Drink enough water and fluids to keep your urine clear or pale yellow.   °· Only take over-the-counter or prescription medicines as directed by your health care provider:   °· If you are prescribed antibiotics, make sure you finish them even if you start to feel better.   °· Do not take aspirin.   °· Get lots of rest.   °· Gargle with 8 oz of salt water (½ tsp of salt per 1 qt of water) as often as every 1-2 hours to soothe your throat.   °· Throat lozenges (if you are not at risk for choking) or sprays may be used to soothe your throat. °SEEK MEDICAL CARE IF:  °· You have large, tender lumps in your neck. °· You have a rash. °· You cough up green, yellow-brown, or bloody spit. °SEEK IMMEDIATE MEDICAL CARE IF:  °· Your neck becomes stiff. °· You drool or are unable to swallow liquids. °· You vomit or are unable to keep medicines or liquids down. °· You have severe pain that does not go away with the use of recommended medicines. °· You have trouble breathing (not caused by a stuffy  nose). °MAKE SURE YOU:  °· Understand these instructions. °· Will watch your condition. °· Will get help right away if you are not doing well or get worse. °Document Released: 08/31/2005 Document Revised: 06/21/2013 Document Reviewed: 05/08/2013 °ExitCare® Patient Information ©2015 [employer]. This information is not intended to replace advice given to you by your health care provider. Make sure you discuss any questions you have with your health care provider. ° °

## 2014-09-26 NOTE — ED Notes (Signed)
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

## 2014-09-28 LAB — CULTURE, GROUP A STREP

## 2014-11-18 ENCOUNTER — Emergency Department (HOSPITAL_BASED_OUTPATIENT_CLINIC_OR_DEPARTMENT_OTHER)
Admission: EM | Admit: 2014-11-18 | Discharge: 2014-11-18 | Disposition: A | Discharge status: Home / Self Care | Payer: Medicaid Other | Attending: Emergency Medicine | Admitting: Emergency Medicine

## 2014-11-18 ENCOUNTER — Emergency Department (HOSPITAL_BASED_OUTPATIENT_CLINIC_OR_DEPARTMENT_OTHER): Payer: Medicaid Other

## 2014-11-18 ENCOUNTER — Encounter (HOSPITAL_BASED_OUTPATIENT_CLINIC_OR_DEPARTMENT_OTHER): Payer: Self-pay | Admitting: *Deleted

## 2014-11-18 DIAGNOSIS — G43909 Migraine, unspecified, not intractable, without status migrainosus: Secondary | ICD-10-CM | POA: Insufficient documentation

## 2014-11-18 DIAGNOSIS — R05 Cough: Secondary | ICD-10-CM | POA: Insufficient documentation

## 2014-11-18 DIAGNOSIS — Z86718 Personal history of other venous thrombosis and embolism: Secondary | ICD-10-CM | POA: Insufficient documentation

## 2014-11-18 DIAGNOSIS — Z8673 Personal history of transient ischemic attack (TIA), and cerebral infarction without residual deficits: Secondary | ICD-10-CM | POA: Insufficient documentation

## 2014-11-18 DIAGNOSIS — R55 Syncope and collapse: Secondary | ICD-10-CM | POA: Insufficient documentation

## 2014-11-18 DIAGNOSIS — Z87891 Personal history of nicotine dependence: Secondary | ICD-10-CM | POA: Diagnosis not present

## 2014-11-18 DIAGNOSIS — E669 Obesity, unspecified: Secondary | ICD-10-CM | POA: Insufficient documentation

## 2014-11-18 DIAGNOSIS — R059 Cough, unspecified: Secondary | ICD-10-CM

## 2014-11-18 DIAGNOSIS — I10 Essential (primary) hypertension: Secondary | ICD-10-CM | POA: Insufficient documentation

## 2014-11-18 DIAGNOSIS — Z79899 Other long term (current) drug therapy: Secondary | ICD-10-CM | POA: Insufficient documentation

## 2014-11-18 DIAGNOSIS — Z791 Long term (current) use of non-steroidal anti-inflammatories (NSAID): Secondary | ICD-10-CM | POA: Insufficient documentation

## 2014-11-18 DIAGNOSIS — J45909 Unspecified asthma, uncomplicated: Secondary | ICD-10-CM | POA: Diagnosis not present

## 2014-11-18 LAB — BASIC METABOLIC PANEL
ANION GAP: 3 — AB (ref 5–15)
BUN: 13 mg/dL (ref 6–23)
CO2: 24 mmol/L (ref 19–32)
CREATININE: 1.11 mg/dL — AB (ref 0.50–1.10)
Calcium: 8.3 mg/dL — ABNORMAL LOW (ref 8.4–10.5)
Chloride: 108 mmol/L (ref 96–112)
GFR calc non Af Amer: 60 mL/min — ABNORMAL LOW (ref >90)
GFR, EST AFRICAN AMERICAN: 70 mL/min — AB (ref >90)
GLUCOSE: 90 mg/dL (ref 70–99)
Potassium: 3.7 mmol/L (ref 3.5–5.1)
SODIUM: 135 mmol/L (ref 135–145)

## 2014-11-18 LAB — CBC WITH DIFFERENTIAL/PLATELET
BASOS ABS: 0 10*3/uL (ref 0.0–0.1)
Basophils Relative: 0 % (ref 0–1)
Eosinophils Absolute: 0.2 10*3/uL (ref 0.0–0.7)
Eosinophils Relative: 2 % (ref 0–5)
HEMATOCRIT: 33.8 % — AB (ref 36.0–46.0)
Hemoglobin: 10.9 g/dL — ABNORMAL LOW (ref 12.0–15.0)
LYMPHS ABS: 3.3 10*3/uL (ref 0.7–4.0)
Lymphocytes Relative: 51 % — ABNORMAL HIGH (ref 12–46)
MCH: 25.2 pg — AB (ref 26.0–34.0)
MCHC: 32.2 g/dL (ref 30.0–36.0)
MCV: 78.2 fL (ref 78.0–100.0)
MONO ABS: 0.7 10*3/uL (ref 0.1–1.0)
MONOS PCT: 11 % (ref 3–12)
Neutro Abs: 2.3 10*3/uL (ref 1.7–7.7)
Neutrophils Relative %: 36 % — ABNORMAL LOW (ref 43–77)
PLATELETS: 353 10*3/uL (ref 150–400)
RBC: 4.32 MIL/uL (ref 3.87–5.11)
RDW: 16.2 % — AB (ref 11.5–15.5)
WBC: 6.5 10*3/uL (ref 4.0–10.5)

## 2014-11-18 NOTE — Discharge Instructions (Signed)
Syncope °Syncope is a medical term for fainting or passing out. This means you lose consciousness and drop to the ground. People are generally unconscious for less than 5 minutes. You may have some muscle twitches for up to 15 seconds before waking up and returning to normal. Syncope occurs more often in older adults, but it can happen to anyone. While most causes of syncope are not dangerous, syncope can be a sign of a serious medical problem. It is important to seek medical care.  °CAUSES  °Syncope is caused by a sudden drop in blood flow to the brain. The specific cause is often not determined. Factors that can bring on syncope include: °· Taking medicines that lower blood pressure. °· Sudden changes in posture, such as standing up quickly. °· Taking more medicine than prescribed. °· Standing in one place for too long. °· Seizure disorders. °· Dehydration and excessive exposure to heat. °· Low blood sugar (hypoglycemia). °· Straining to have a bowel movement. °· Heart disease, irregular heartbeat, or other circulatory problems. °· Fear, emotional distress, seeing blood, or severe pain. °SYMPTOMS  °Right before fainting, you may: °· Feel dizzy or light-headed. °· Feel nauseous. °· See all white or all black in your field of vision. °· Have cold, clammy skin. °DIAGNOSIS  °Your health care provider will ask about your symptoms, perform a physical exam, and perform an electrocardiogram (ECG) to record the electrical activity of your heart. Your health care provider may also perform other heart or blood tests to determine the cause of your syncope which may include: °· Transthoracic echocardiogram (TTE). During echocardiography, sound waves are used to evaluate how blood flows through your heart. °· Transesophageal echocardiogram (TEE). °· Cardiac monitoring. This allows your health care provider to monitor your heart rate and rhythm in real time. °· Holter monitor. This is a portable device that records your  heartbeat and can help diagnose heart arrhythmias. It allows your health care provider to track your heart activity for several days, if needed. °· Stress tests by exercise or by giving medicine that makes the heart beat faster. °TREATMENT  °In most cases, no treatment is needed. Depending on the cause of your syncope, your health care provider may recommend changing or stopping some of your medicines. °HOME CARE INSTRUCTIONS °· Have someone stay with you until you feel stable. °· Do not drive, use machinery, or play sports until your health care provider says it is okay. °· Keep all follow-up appointments as directed by your health care provider. °· Lie down right away if you start feeling like you might faint. Breathe deeply and steadily. Wait until all the symptoms have passed. °· Drink enough fluids to keep your urine clear or pale yellow. °· If you are taking blood pressure or heart medicine, get up slowly and take several minutes to sit and then stand. This can reduce dizziness. °SEEK IMMEDIATE MEDICAL CARE IF:  °· You have a severe headache. °· You have unusual pain in the chest, abdomen, or back. °· You are bleeding from your mouth or rectum, or you have black or tarry stool. °· You have an irregular or very fast heartbeat. °· You have pain with breathing. °· You have repeated fainting or seizure-like jerking during an episode. °· You faint when sitting or lying down. °· You have confusion. °· You have trouble walking. °· You have severe weakness. °· You have vision problems. °If you fainted, call your local emergency services (911 in [place] Do not drive   yourself to the hospital.  °MAKE SURE YOU: °· Understand these instructions. °· Will watch your condition. °· Will get help right away if you are not doing well or get worse. °Document Released: 08/31/2005 Document Revised: 09/05/2013 Document Reviewed: 10/30/2011 °ExitCare® Patient Information ©2015 [employer]. This information is not intended to replace  advice given to you by your health care provider. Make sure you discuss any questions you have with your health care provider. ° °

## 2014-11-18 NOTE — ED Provider Notes (Signed)
CSN: [id number]     Arrival date & time 11/18/14  1155 History   First MD Initiated Contact with Patient 11/18/14 1251     Chief Complaint  Patient presents with  . Near Syncope     (Consider location/radiation/quality/duration/timing/severity/associated sxs/prior Treatment) HPI Comments: Patient is a 44 year old female who presents after a syncopal episode. She reports she has been coughing for the past 2 months and this has been unresolved with antibiotics and steroids. Today she had a coughing fit, then passed out. She was unresponsive for several minutes for completely waking back up. There was no reported seizure activity and she denies any headache, chest pain, or other injury.  Patient is a 44 y.o. female presenting with near-syncope. The history is provided by the patient.  Near Syncope This is a new problem. The current episode started 1 to 2 hours ago. The problem occurs constantly. The problem has been resolved. Nothing aggravates the symptoms. Nothing relieves the symptoms. She has tried nothing for the symptoms. The treatment provided no relief.    Past Medical History  Diagnosis Date  . Obesity   . Stroke   . DVT (deep venous thrombosis)   . High cholesterol   . Migraine   . Hypertension   . Asthma    Past Surgical History  Procedure Laterality Date  . Tubal ligation     No family history on file. History  Substance Use Topics  . Smoking status: Former Games developermoker  . Smokeless tobacco: Never Used  . Alcohol Use: Yes     Comment: occaisional    OB History    No data available     Review of Systems  Cardiovascular: Positive for near-syncope.  All other systems reviewed and are negative.     Allergies  Review of patient's allergies indicates no known allergies.  Home Medications   Prior to Admission medications   Medication Sig Start Date End Date Taking? Authorizing Provider  albuterol (PROVENTIL HFA;VENTOLIN HFA) 108 (90 BASE) MCG/ACT inhaler Inhale 2  puffs into the lungs every 4 (four) hours as needed for wheezing or shortness of breath. 09/26/14   Gilda Creasehristopher J. Pollina, MD  amoxicillin (AMOXIL) 500 MG capsule Take 1 capsule (500 mg total) by mouth 3 (three) times daily. 09/26/14   Gilda Creasehristopher J. Pollina, MD  aspirin-acetaminophen-caffeine (EXCEDRIN MIGRAINE) [id number] MG per tablet Take 2 tablets by mouth every 6 (six) hours as needed for headache.    Historical Provider, MD  ibuprofen (ADVIL,MOTRIN) 600 MG tablet Take 600 mg by mouth every 6 (six) hours as needed. For pain    Historical Provider, MD  naproxen sodium (ANAPROX) 275 MG tablet Take 1 tablet (275 mg total) by mouth every 6 (six) hours as needed. 01/23/14   Leona SingletonMaria T Thekkekandam, MD  ondansetron (ZOFRAN ODT) 4 MG disintegrating tablet Take 1 tablet (4 mg total) by mouth every 8 (eight) hours as needed for nausea. 01/11/14   Rolland PorterMark James, MD  ORSYTHIA 0.1-20 MG-MCG tablet Take 1 tablet by mouth daily. 01/05/14   Historical Provider, MD  predniSONE (DELTASONE) 20 MG tablet Take 2 tablets (40 mg total) by mouth daily with breakfast. 09/26/14   Gilda Creasehristopher J. Pollina, MD  promethazine-codeine [facility](PHENERGAN WITH CODEINE) 6.25-10 MG/5ML syrup Take 5 mLs by mouth every 6 (six) hours as needed for cough. 09/09/14   Garlon HatchetLisa M Sanders, PA-C  SUMAtriptan (IMITREX) 25 MG tablet Take 1 tablet (25 mg total) by mouth every 2 (two) hours as needed for migraine or headache. May repeat  in 2 hours if headache persists or recurs. 01/11/14   Rolland Porter, MD   BP 150/67 mmHg  Pulse 75  Temp(Src) 98.2 F (36.8 C) (Oral)  Resp 20  Ht  (1.626 m)  Wt 320 lb (145.151 kg)  BMI 54.90 kg/m2  SpO2 100% Physical Exam  Constitutional: She is oriented to person, place, and time. She appears well-developed and well-nourished. No distress.  HENT:  Head: Normocephalic and atraumatic.  Mouth/Throat: Oropharynx is clear and moist.  Eyes: EOM are normal. Pupils are equal, round, and reactive to light.  Neck: Normal range  of motion. Neck supple.  Cardiovascular: Normal rate and regular rhythm.  Exam reveals no gallop and no friction rub.   No murmur heard. Pulmonary/Chest: Effort normal and breath sounds normal. No respiratory distress. She has no wheezes.  Abdominal: Soft. Bowel sounds are normal. She exhibits no distension. There is no tenderness.  Musculoskeletal: Normal range of motion.  Neurological: She is alert and oriented to person, place, and time. No cranial nerve deficit. She exhibits normal muscle tone. Coordination normal.  Skin: Skin is warm and dry. She is not diaphoretic.  Nursing note and vitals reviewed.   ED Course  Procedures (including critical care time) Labs Review Labs Reviewed - No data to display  Imaging Review No results found.   EKG Interpretation   Date/Time:  Sunday November 18 2014 12:09:12 EST Ventricular Rate:  71 PR Interval:  132 QRS Duration: 96 QT Interval:  396 QTC Calculation: 430 R Axis:   35 Text Interpretation:  Normal sinus rhythm Normal ECG Confirmed by DELOS   MD, Shirlene Andaya ([id number]) on 11/18/2014 12:50:07 PM      MDM   Final diagnoses:  None    Patient presents after an episode of what sounds like tussive syncope. She has had an unexplained cough for the last 2 months and her coughing became so bad today that she passed out. Her workup is otherwise unremarkable. Laboratory studies reveal no explanation for the syncope and CT scan of the head and chest x-ray are both unremarkable. She will be discharged with follow-up with her primary Dr. and when necessary return.    Geoffery Lyons, MD 11/18/14 1501

## 2014-11-18 NOTE — ED Notes (Addendum)
Pt states that she has had a cough for appx 2 months. Today she states that she started coughing and "blacked  Out". States she heard one of her sons say to call 911 and recalls her body shaking, pt told family not to call.. States that now she feels "foggy" on the right side of her head.

## 2015-05-11 IMAGING — CT CT HEAD W/O CM
1 series · 16 of 30 positions shown, 20 images · non-contrast
Comparison: 12/08/2011.

CLINICAL DATA: Headache and left eye pain.

CT HEAD WITHOUT CONTRAST
TECHNIQUE: Contiguous axial images were obtained from the base of
the skull through the vertex without contrast.

[Series 2: head 4.8 h37s · axial · 0.49mm/px · z∈[-166,-26]mm · 16 of 32 slices shown, 20 images]
[im 2/32  brain]
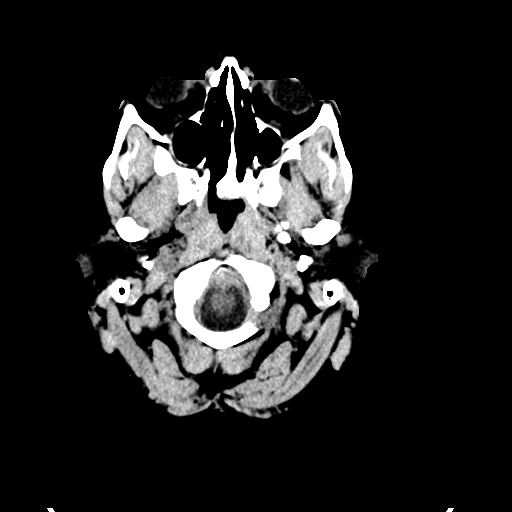
[im 2/32  bone]
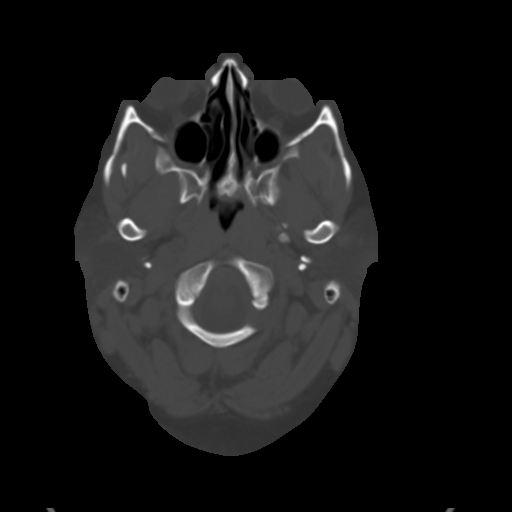
[im 4/32  brain]
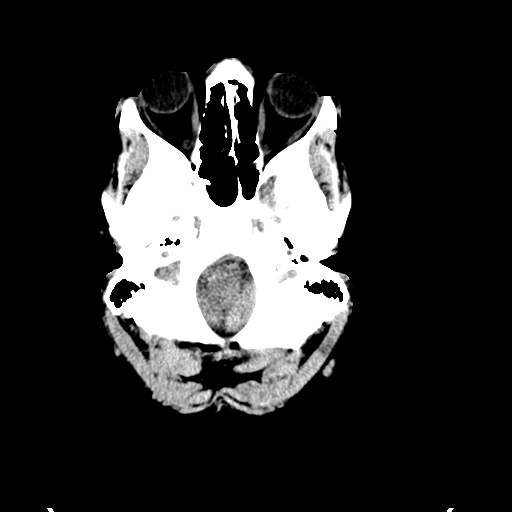
[im 6/32  brain]
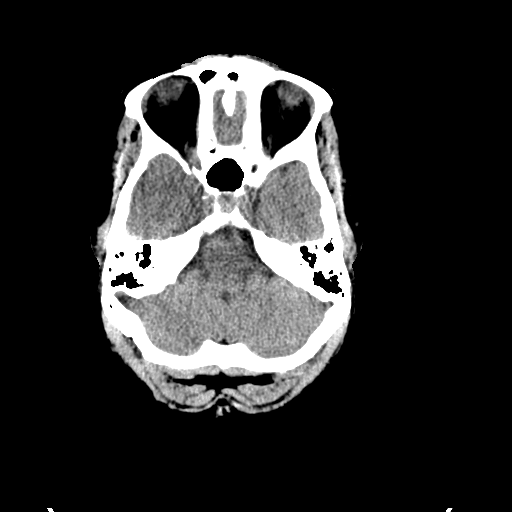
[im 8/32  brain]
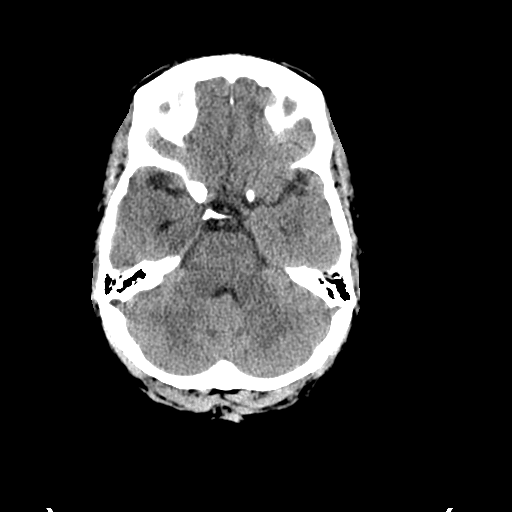
[im 9/32  brain]
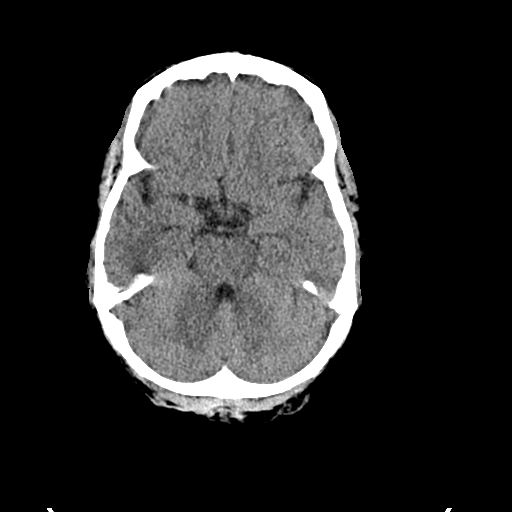
[im 9/32  bone]
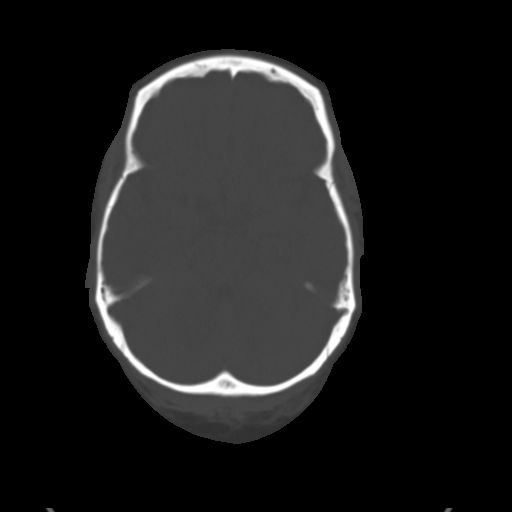
[im 11/32  brain]
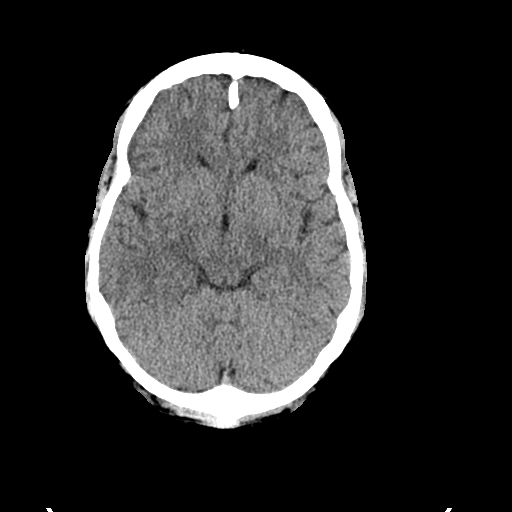
[im 13/32  brain]
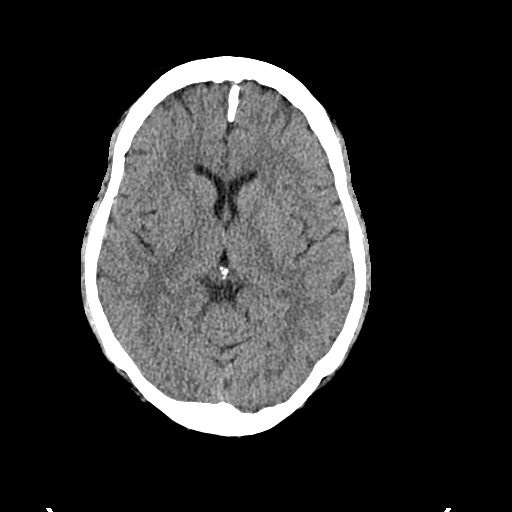
[im 15/32  brain]
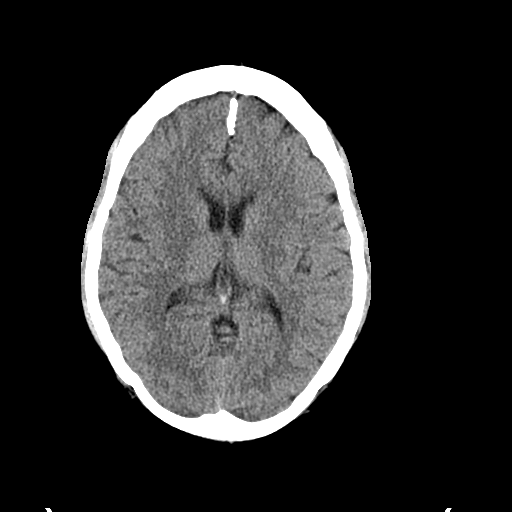
[im 17/32  brain]
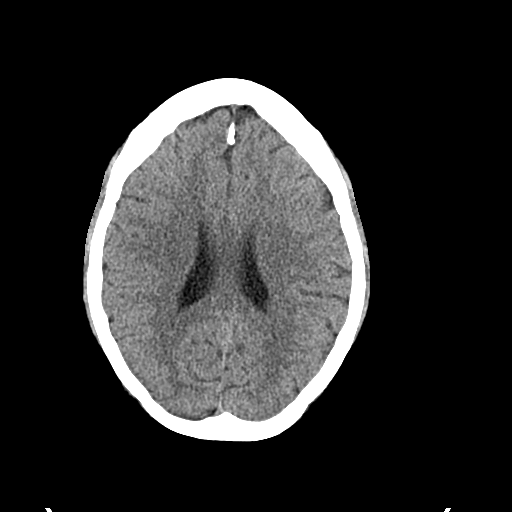
[im 17/32  bone]
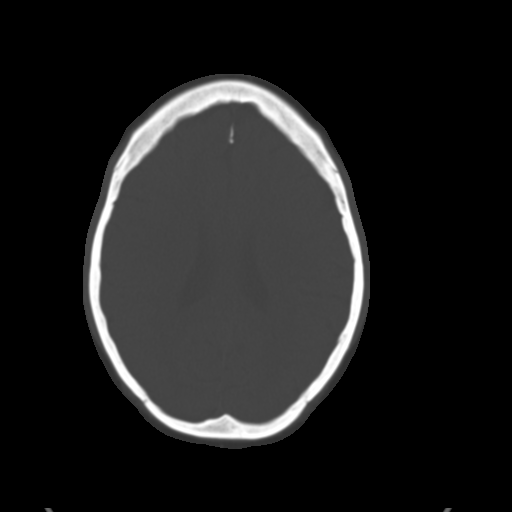
[im 19/32  brain]
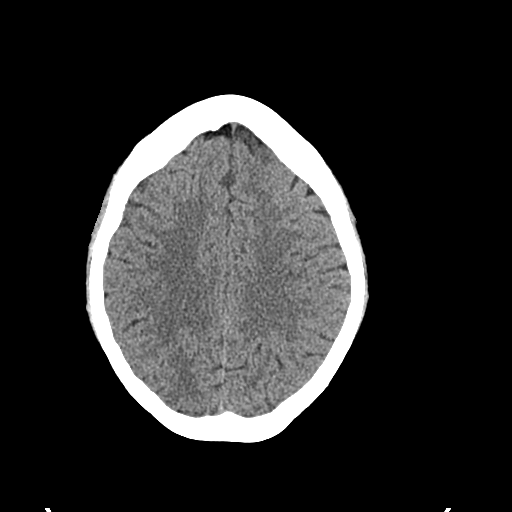
[im 21/32  brain]
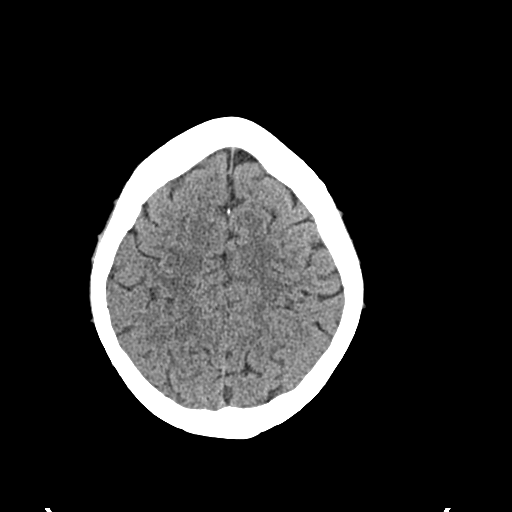
[im 23/32  brain]
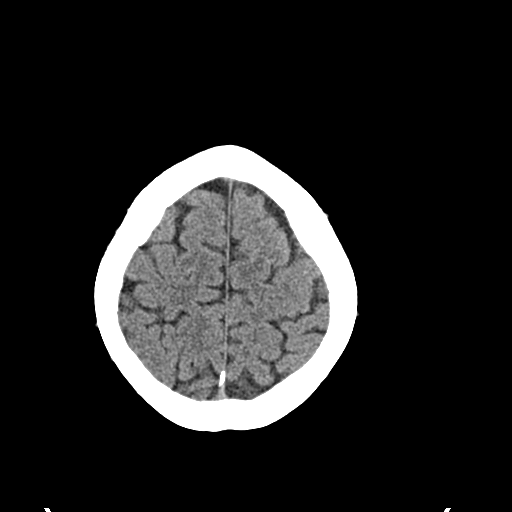
[im 24/32  brain]
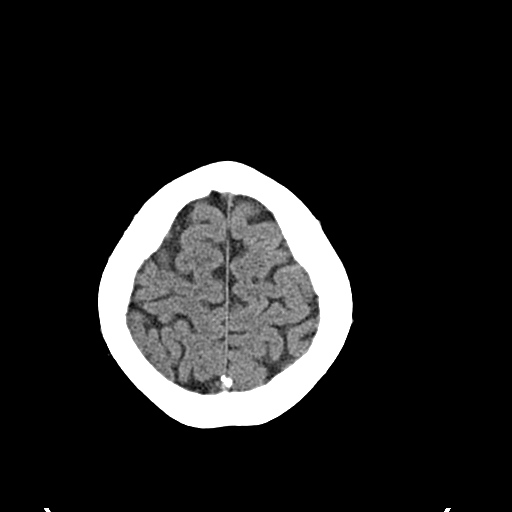
[im 24/32  bone]
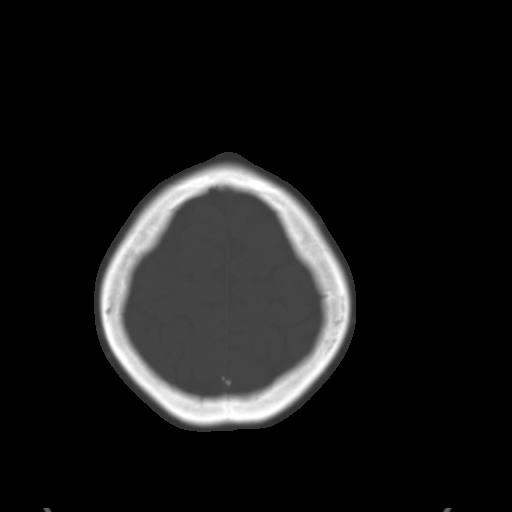
[im 26/32  brain]
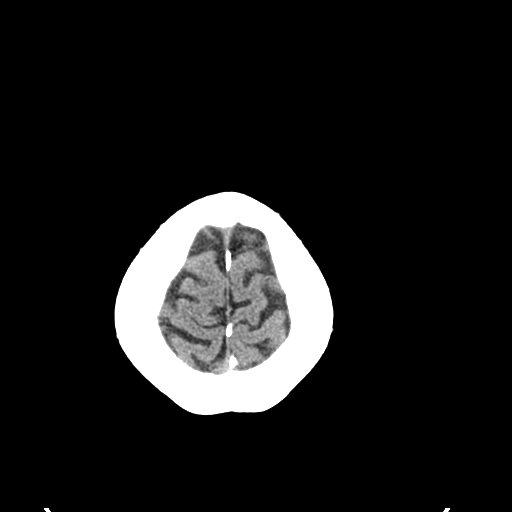
[im 28/32  brain]
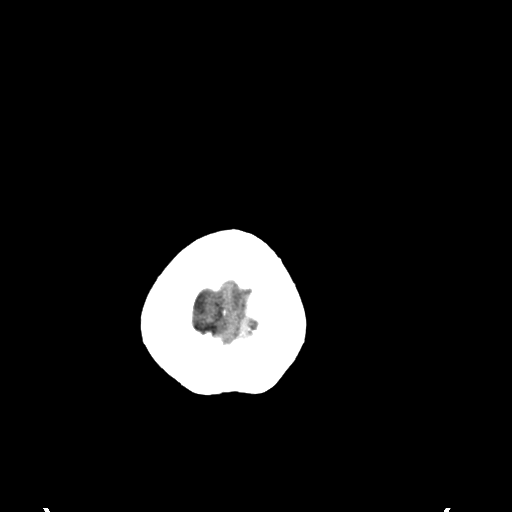
[im 30/32  brain]
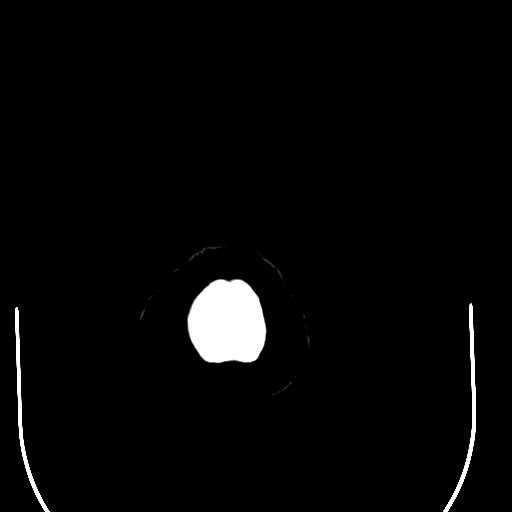

[16 of 30 positions shown; findings below may reference images not displayed]

FINDINGS: The ventricles are normal.  No extra-axial fluid
collections are seen.  The brainstem and cerebellum are
unremarkable.  No acute intracranial findings such as infarction or
hemorrhage.  No mass lesions.

The bony calvarium is intact.  The visualized paranasal sinuses and
mastoid air cells are clear.
IMPRESSION: No acute intracranial findings or mass lesions.

## 2015-07-08 ENCOUNTER — Emergency Department (HOSPITAL_BASED_OUTPATIENT_CLINIC_OR_DEPARTMENT_OTHER)
Admission: EM | Admit: 2015-07-08 | Discharge: 2015-07-08 | Disposition: A | Discharge status: Home / Self Care | Payer: Medicaid Other | Attending: Emergency Medicine | Admitting: Emergency Medicine

## 2015-07-08 ENCOUNTER — Encounter (HOSPITAL_BASED_OUTPATIENT_CLINIC_OR_DEPARTMENT_OTHER): Payer: Self-pay | Admitting: *Deleted

## 2015-07-08 DIAGNOSIS — Z7952 Long term (current) use of systemic steroids: Secondary | ICD-10-CM | POA: Insufficient documentation

## 2015-07-08 DIAGNOSIS — J45909 Unspecified asthma, uncomplicated: Secondary | ICD-10-CM | POA: Diagnosis not present

## 2015-07-08 DIAGNOSIS — Z87891 Personal history of nicotine dependence: Secondary | ICD-10-CM | POA: Diagnosis not present

## 2015-07-08 DIAGNOSIS — Z792 Long term (current) use of antibiotics: Secondary | ICD-10-CM | POA: Diagnosis not present

## 2015-07-08 DIAGNOSIS — R519 Headache, unspecified: Secondary | ICD-10-CM

## 2015-07-08 DIAGNOSIS — I10 Essential (primary) hypertension: Secondary | ICD-10-CM | POA: Diagnosis not present

## 2015-07-08 DIAGNOSIS — Z86718 Personal history of other venous thrombosis and embolism: Secondary | ICD-10-CM | POA: Insufficient documentation

## 2015-07-08 DIAGNOSIS — G43909 Migraine, unspecified, not intractable, without status migrainosus: Secondary | ICD-10-CM | POA: Insufficient documentation

## 2015-07-08 DIAGNOSIS — Z8673 Personal history of transient ischemic attack (TIA), and cerebral infarction without residual deficits: Secondary | ICD-10-CM | POA: Diagnosis not present

## 2015-07-08 DIAGNOSIS — G43109 Migraine with aura, not intractable, without status migrainosus: Secondary | ICD-10-CM

## 2015-07-08 DIAGNOSIS — Z79899 Other long term (current) drug therapy: Secondary | ICD-10-CM | POA: Insufficient documentation

## 2015-07-08 DIAGNOSIS — R51 Headache: Secondary | ICD-10-CM | POA: Diagnosis present

## 2015-07-08 DIAGNOSIS — E669 Obesity, unspecified: Secondary | ICD-10-CM | POA: Insufficient documentation

## 2015-07-08 MED ORDER — KETOROLAC TROMETHAMINE 30 MG/ML IJ SOLN
30.0000 mg | Freq: Once | INTRAMUSCULAR | Status: AC
  Administered 2015-07-08: 30 mg via INTRAVENOUS
  Filled 2015-07-08: qty 1

## 2015-07-08 MED ORDER — DIPHENHYDRAMINE HCL 50 MG/ML IJ SOLN
12.5000 mg | Freq: Once | INTRAMUSCULAR | Status: AC
  Administered 2015-07-08: 12.5 mg via INTRAVENOUS
  Filled 2015-07-08: qty 1

## 2015-07-08 MED ORDER — METOCLOPRAMIDE HCL 5 MG/ML IJ SOLN
10.0000 mg | Freq: Once | INTRAMUSCULAR | Status: AC
  Administered 2015-07-08: 10 mg via INTRAVENOUS
  Filled 2015-07-08: qty 2

## 2015-07-08 MED ORDER — SODIUM CHLORIDE 0.9 % IV BOLUS (SEPSIS)
500.0000 mL | Freq: Once | INTRAVENOUS | Status: AC
  Administered 2015-07-08: 500 mL via INTRAVENOUS

## 2015-07-08 MED ORDER — DEXAMETHASONE SODIUM PHOSPHATE 10 MG/ML IJ SOLN
10.0000 mg | Freq: Once | INTRAMUSCULAR | Status: AC
  Administered 2015-07-08: 10 mg via INTRAVENOUS
  Filled 2015-07-08: qty 1

## 2015-07-08 NOTE — ED Notes (Signed)
Pt reports headache since 0300, states has been having headaches more frequently for the last 3 months. Also reports pain in her legs over last week. States they feel weak

## 2015-07-08 NOTE — ED Notes (Signed)
PA at bedside.

## 2015-07-08 NOTE — ED Notes (Signed)
D/c home. No new Rx given- pt alert and oriented. Ambulatory with steady gait to d/c window

## 2015-07-08 NOTE — Discharge Instructions (Signed)
1. Medications: usual home medications 2. Treatment: rest, drink plenty of fluids,  3. Follow Up: Please followup with your primary doctor in 2-3 days for discussion of your diagnoses and further evaluation after today's visit; if you do not have a primary care doctor use the resource guide provided to find one; Please return to the ER for worsening or abnormal headaches

## 2015-07-08 NOTE — ED Provider Notes (Signed)
CSN: [id number]     Arrival date & time 07/08/15  [id number] History   First MD Initiated Contact with Patient 07/08/15 [phone]     Chief Complaint  Patient presents with  . Headache     (Consider location/radiation/quality/duration/timing/severity/associated sxs/prior Treatment) The history is provided by the patient and medical records. No language interpreter was used.     Terri Boyd is a 44 y.o. female  with a hx of obesity, CVA (" many stroke" in 2012 without residual deficit), DVT (2012, not anticoagulated), migraine headache, HTN, asthmapresents to the Emergency Department complaining of gradual, persistent, progressively worsening left-sided headache beginning at 3 AM this morning.  Patient denies thunderclap headache, worse headache of her life. She reports it is throbbing and rated at an 8/10. Patient reports she works third shift. She states at approximately the same time that her headache began to worsen she began to feel generally weak in her legs but has had no difficulty walking. She states she has had an increase in the frequency of her migraines in the last 3 months moving from several times per month to several times per week. Her headache 2 days associated with nausea but without photophobia or visual changes. She denies neck pain, neck stiffness, fevers, chills, chest pain, shortness of breath, abdominal pain, vomiting, diarrhea, syncope, dysuria, hematuria.  He is also complaining of intermittent right lower leg pain that is not currently present.   Denies estrogen usage, recent travel, swelling of her legs, recent immobilization, recent surgery or recent fracture.   Past Medical History  Diagnosis Date  . Obesity   . Stroke (HCC)   . DVT (deep venous thrombosis) (HCC)   . High cholesterol   . Migraine   . Hypertension   . Asthma    Past Surgical History  Procedure Laterality Date  . Tubal ligation     No family history on file. Social History  Substance Use Topics   . Smoking status: Former Games developermoker  . Smokeless tobacco: Never Used  . Alcohol Use: Yes     Comment: 2 glasses wine/weekly   OB History    No data available     Review of Systems  Constitutional: Negative for fever, diaphoresis, appetite change, fatigue and unexpected weight change.  HENT: Negative for mouth sores.   Eyes: Negative for visual disturbance.  Respiratory: Negative for cough, chest tightness, shortness of breath and wheezing.   Cardiovascular: Negative for chest pain.  Gastrointestinal: Positive for nausea. Negative for vomiting, abdominal pain, diarrhea and constipation.  Endocrine: Negative for polydipsia, polyphagia and polyuria.  Genitourinary: Negative for dysuria, urgency, frequency and hematuria.  Musculoskeletal: Negative for back pain and neck stiffness.  Skin: Negative for rash.  Allergic/Immunologic: Negative for immunocompromised state.  Neurological: Positive for weakness (generalized) and headaches. Negative for syncope and light-headedness.  Hematological: Does not bruise/bleed easily.  Psychiatric/Behavioral: Negative for sleep disturbance. The patient is not nervous/anxious.       Allergies  Review of patient's allergies indicates no known allergies.  Home Medications   Prior to Admission medications   Medication Sig Start Date End Date Taking? Authorizing Provider  aspirin-acetaminophen-caffeine (EXCEDRIN MIGRAINE) [phone] MG per tablet Take 2 tablets by mouth every 6 (six) hours as needed for headache.   Yes Historical Provider, MD  ibuprofen (ADVIL,MOTRIN) 600 MG tablet Take 600 mg by mouth every 6 (six) hours as needed. For pain   Yes Historical Provider, MD  albuterol (PROVENTIL HFA;VENTOLIN HFA) 108 (90 BASE) MCG/ACT inhaler Inhale  2 puffs into the lungs every 4 (four) hours as needed for wheezing or shortness of breath. 09/26/14   Gilda Crease, MD  amoxicillin (AMOXIL) 500 MG capsule Take 1 capsule (500 mg total) by mouth 3 (three)  times daily. 09/26/14   Gilda Crease, MD  naproxen sodium (ANAPROX) 275 MG tablet Take 1 tablet (275 mg total) by mouth every 6 (six) hours as needed. 01/23/14   Leona Singleton, MD  ondansetron (ZOFRAN ODT) 4 MG disintegrating tablet Take 1 tablet (4 mg total) by mouth every 8 (eight) hours as needed for nausea. 01/11/14   Rolland Porter, MD  ORSYTHIA 0.1-20 MG-MCG tablet Take 1 tablet by mouth daily. 01/05/14   Historical Provider, MD  predniSONE (DELTASONE) 20 MG tablet Take 2 tablets (40 mg total) by mouth daily with breakfast. 09/26/14   Gilda Crease, MD  promethazine-codeine (PHENERGAN WITH CODEINE) 6.25-10 MG/5ML syrup Take 5 mLs by mouth every 6 (six) hours as needed for cough. 09/09/14   Garlon Hatchet, PA-C  SUMAtriptan (IMITREX) 25 MG tablet Take 1 tablet (25 mg total) by mouth every 2 (two) hours as needed for migraine or headache. May repeat in 2 hours if headache persists or recurs. 01/11/14   Rolland Porter, MD   BP 115/66 mmHg  Pulse 74  Temp(Src) 98.3 F (36.8 C) (Oral)  Resp 16  Ht  (1.626 m)  Wt 340 lb (154.223 kg)  BMI 58.33 kg/m2  SpO2 100%  LMP 07/05/2015 Physical Exam  Constitutional: She is oriented to person, place, and time. She appears well-developed and well-nourished. No distress.  Awake, alert, nontoxic appearance  HENT:  Head: Normocephalic and atraumatic.  Mouth/Throat: Oropharynx is clear and moist. No oropharyngeal exudate.  Eyes: Conjunctivae and EOM are normal. Pupils are equal, round, and reactive to light. No scleral icterus.  No horizontal, vertical or rotational nystagmus  Neck: Normal range of motion. Neck supple.  Full active and passive ROM without pain No midline or paraspinal tenderness No nuchal rigidity or meningeal signs  Cardiovascular: Normal rate, regular rhythm, normal heart sounds and intact distal pulses.   Pulmonary/Chest: Effort normal and breath sounds normal. No respiratory distress. She has no wheezes. She has no  rales.  Equal chest expansion  Abdominal: Soft. Bowel sounds are normal. She exhibits no mass. There is no tenderness. There is no rebound and no guarding.  Musculoskeletal: Normal range of motion. She exhibits no edema.  Lymphadenopathy:    She has no cervical adenopathy.  Neurological: She is alert and oriented to person, place, and time. She has normal reflexes. No cranial nerve deficit. She exhibits normal muscle tone. Coordination normal.  Mental Status:  Alert, oriented, thought content appropriate. Speech fluent without evidence of aphasia. Able to follow 2 step commands without difficulty.  Cranial Nerves:  II:  Peripheral visual fields grossly normal, pupils equal, round, reactive to light III,IV, VI: ptosis not present, extra-ocular motions intact bilaterally  V,VII: smile symmetric, facial light touch sensation equal VIII: hearing grossly normal bilaterally  IX,X: midline uvula rise  XI: bilateral shoulder shrug equal and strong XII: midline tongue extension  Motor:  5/5 in upper and lower extremities bilaterally including strong and equal grip strength and dorsiflexion/plantar flexion Sensory: Pinprick and light touch normal in all extremities.  Deep Tendon Reflexes: 2+ and symmetric  Cerebellar: normal finger-to-nose with bilateral upper extremities Gait: normal gait and balance CV: distal pulses palpable throughout   Skin: Skin is warm and dry. No rash  noted. She is not diaphoretic.  Psychiatric: She has a normal mood and affect. Her behavior is normal. Judgment and thought content normal.  Nursing note and vitals reviewed.   ED Course  Procedures (including critical care time) Labs Review Labs Reviewed - No data to display  Imaging Review No results found. I have personally reviewed and evaluated these images and lab results as part of my medical decision-making.   EKG Interpretation None      MDM   Final diagnoses:  Intractable headache, unspecified  chronicity pattern, unspecified headache type  Complicated migraine   Terri Large presents with a hx of migraine and similar headache today.  Pt c/o generalized weakness in her legs, but has 5/5 strength on exam.  No clinical signs of DVT.  Will give treatment and reassess.  10:30 AM Patient reports some improvement in her headache but no resolution. She reports her legs are feeling better. Will give further treatment.  11:55 AM Patient reports complete resolution of her headache along with feeling of weakness in her legs. She relates the bathroom unassisted. Gross neurologic exam remains intact.  Pt HA treated and improved while in ED.  Presentation is like pts typical HA and non concerning for [facility], ICH, Meningitis, or temporal arteritis. Pt is afebrile with no focal neuro deficits, nuchal rigidity, or change in vision. Pt is to follow up with PCP to discuss prophylactic medication. Pt verbalizes understanding and is agreeable with plan to dc.   BP 115/66 mmHg  Pulse 74  Temp(Src) 98.3 F (36.8 C) (Oral)  Resp 16  Ht  (1.626 m)  Wt 340 lb (154.223 kg)  BMI 58.33 kg/m2  SpO2 100%  LMP 07/05/2015    Dierdre Forth, PA-C 07/08/15 1157  Marily Memos, MD 07/09/15 1622

## 2015-11-17 ENCOUNTER — Emergency Department (HOSPITAL_BASED_OUTPATIENT_CLINIC_OR_DEPARTMENT_OTHER)
Admission: EM | Admit: 2015-11-17 | Discharge: 2015-11-17 | Disposition: A | Discharge status: Home / Self Care | Payer: Medicaid Other | Attending: Emergency Medicine | Admitting: Emergency Medicine

## 2015-11-17 ENCOUNTER — Encounter (HOSPITAL_BASED_OUTPATIENT_CLINIC_OR_DEPARTMENT_OTHER): Payer: Self-pay | Admitting: Emergency Medicine

## 2015-11-17 DIAGNOSIS — Z7982 Long term (current) use of aspirin: Secondary | ICD-10-CM | POA: Insufficient documentation

## 2015-11-17 DIAGNOSIS — Z792 Long term (current) use of antibiotics: Secondary | ICD-10-CM | POA: Diagnosis not present

## 2015-11-17 DIAGNOSIS — Z86718 Personal history of other venous thrombosis and embolism: Secondary | ICD-10-CM | POA: Insufficient documentation

## 2015-11-17 DIAGNOSIS — Z87891 Personal history of nicotine dependence: Secondary | ICD-10-CM | POA: Diagnosis not present

## 2015-11-17 DIAGNOSIS — I10 Essential (primary) hypertension: Secondary | ICD-10-CM | POA: Insufficient documentation

## 2015-11-17 DIAGNOSIS — J0100 Acute maxillary sinusitis, unspecified: Secondary | ICD-10-CM | POA: Diagnosis not present

## 2015-11-17 DIAGNOSIS — G43909 Migraine, unspecified, not intractable, without status migrainosus: Secondary | ICD-10-CM | POA: Diagnosis not present

## 2015-11-17 DIAGNOSIS — E669 Obesity, unspecified: Secondary | ICD-10-CM | POA: Diagnosis not present

## 2015-11-17 DIAGNOSIS — Z7952 Long term (current) use of systemic steroids: Secondary | ICD-10-CM | POA: Diagnosis not present

## 2015-11-17 DIAGNOSIS — Z8673 Personal history of transient ischemic attack (TIA), and cerebral infarction without residual deficits: Secondary | ICD-10-CM | POA: Diagnosis not present

## 2015-11-17 DIAGNOSIS — J45909 Unspecified asthma, uncomplicated: Secondary | ICD-10-CM | POA: Diagnosis not present

## 2015-11-17 DIAGNOSIS — Z79899 Other long term (current) drug therapy: Secondary | ICD-10-CM | POA: Insufficient documentation

## 2015-11-17 DIAGNOSIS — R110 Nausea: Secondary | ICD-10-CM | POA: Insufficient documentation

## 2015-11-17 DIAGNOSIS — M791 Myalgia: Secondary | ICD-10-CM | POA: Insufficient documentation

## 2015-11-17 DIAGNOSIS — J029 Acute pharyngitis, unspecified: Secondary | ICD-10-CM | POA: Diagnosis present

## 2015-11-17 MED ORDER — DEXAMETHASONE SODIUM PHOSPHATE 10 MG/ML IJ SOLN
10.0000 mg | Freq: Once | INTRAMUSCULAR | Status: AC
  Administered 2015-11-17: 10 mg via INTRAMUSCULAR
  Filled 2015-11-17: qty 1

## 2015-11-17 NOTE — ED Notes (Signed)
Pt in c/o headache, sore throat, and flu-like sx onset two weeks ago but worse today. Has been seen and dx with ear infection.

## 2015-11-17 NOTE — ED Provider Notes (Signed)
CSN: [id number]     Arrival date & time 11/17/15  2144 History  By signing my name below, I, Tanda Rockers, attest that this documentation has been prepared under the direction and in the presence of Rolan Bucco, MD. Electronically Signed: Tanda Rockers, ED Scribe. 11/17/2015. 10:04 PM.   Chief Complaint  Patient presents with  . Headache  . Sore Throat   The history is provided by the patient. No language interpreter was used.     HPI Comments: Terri Boyd is a 45 y.o. female who presents to the Emergency Department complaining of gradual onset, constant, sore throat x 3 weeks, worsening today. Pt also complains of a fever that has since resolved, cough, congestion, and myalgias. Pt attributed her symptoms to possible flu and was self treating it with OTC medications. She reports that she woke up today with a worsening sore throat, sinus pressure, headache, nausea,  and post nasal drip. Her headache is exacerbated with laying down. Pt was seen at urgent care today and diagnosed with an ear infection. She was given a 10 day course of Augmentin, cough medicine, and saline nasal spray. Denies new onset fever, vomiting, diarrhea, or any other associated symptoms.   Past Medical History  Diagnosis Date  . Obesity   . Stroke (HCC)   . DVT (deep venous thrombosis) (HCC)   . High cholesterol   . Migraine   . Hypertension   . Asthma    Past Surgical History  Procedure Laterality Date  . Tubal ligation     History reviewed. No pertinent family history. Social History  Substance Use Topics  . Smoking status: Former Games developer  . Smokeless tobacco: Never Used  . Alcohol Use: Yes     Comment: 2 glasses wine/weekly   OB History    No data available     Review of Systems  Constitutional: Positive for fever (resolved). Negative for chills, diaphoresis and fatigue.  HENT: Positive for congestion, postnasal drip, sinus pressure and sore throat. Negative for rhinorrhea and sneezing.    Eyes: Negative.   Respiratory: Positive for cough. Negative for chest tightness and shortness of breath.   Cardiovascular: Negative for chest pain and leg swelling.  Gastrointestinal: Positive for nausea. Negative for vomiting, abdominal pain, diarrhea and blood in stool.  Genitourinary: Negative for frequency, hematuria, flank pain and difficulty urinating.  Musculoskeletal: Positive for myalgias. Negative for back pain and arthralgias.  Skin: Negative for rash.  Neurological: Positive for headaches. Negative for dizziness, speech difficulty, weakness and numbness.   Allergies  Review of patient's allergies indicates no known allergies.  Home Medications   Prior to Admission medications   Medication Sig Start Date End Date Taking? Authorizing Provider  albuterol (PROVENTIL HFA;VENTOLIN HFA) 108 (90 BASE) MCG/ACT inhaler Inhale 2 puffs into the lungs every 4 (four) hours as needed for wheezing or shortness of breath. 09/26/14   Gilda Crease, MD  amoxicillin (AMOXIL) 500 MG capsule Take 1 capsule (500 mg total) by mouth 3 (three) times daily. 09/26/14   Gilda Crease, MD  aspirin-acetaminophen-caffeine (EXCEDRIN MIGRAINE) [phone] MG per tablet Take 2 tablets by mouth every 6 (six) hours as needed for headache.    Historical Provider, MD  ibuprofen (ADVIL,MOTRIN) 600 MG tablet Take 600 mg by mouth every 6 (six) hours as needed. For pain    Historical Provider, MD  naproxen sodium (ANAPROX) 275 MG tablet Take 1 tablet (275 mg total) by mouth every 6 (six) hours as needed. 01/23/14  Maria T Thekkekandam, MD  ondansetron (ZOFRAN ODT) 4 MG disintegrLeona Singletonating tablet Take 1 tablet (4 mg total) by mouth every 8 (eight) hours as needed for nausea. 01/11/14   Rolland PorterMark James, MD  ORSYTHIA 0.1-20 MG-MCG tablet Take 1 tablet by mouth daily. 01/05/14   Historical Provider, MD  predniSONE (DELTASONE) 20 MG tablet Take 2 tablets (40 mg total) by mouth daily with breakfast. 09/26/14   Gilda Creasehristopher J  Pollina, MD  promethazine-codeine (PHENERGAN WITH CODEINE) 6.25-10 MG/5ML syrup Take 5 mLs by mouth every 6 (six) hours as needed for cough. 09/09/14   Garlon HatchetLisa M Sanders, PA-C  SUMAtriptan (IMITREX) 25 MG tablet Take 1 tablet (25 mg total) by mouth every 2 (two) hours as needed for migraine or headache. May repeat in 2 hours if headache persists or recurs. 01/11/14   Rolland PorterMark James, MD   BP 146/92 mmHg  Pulse 82  Temp(Src) 98.6 F (37 C) (Oral)  Resp 22  Ht 5\' 4"  (1.626 m)  Wt 360 lb (163.295 kg)  BMI 61.76 kg/m2  SpO2 100%   Physical Exam  Constitutional: She is oriented to person, place, and time. She appears well-developed and well-nourished.  HENT:  Head: Normocephalic and atraumatic.  Right Ear: External ear normal.  Left Ear: External ear normal.  Mouth/Throat: Oropharynx is clear and moist. No oropharyngeal exudate.  Nasal congestion Tenderness over frontal and maxillay sinuses bilaterally  Eyes: Pupils are equal, round, and reactive to light.  No photophobia  Neck: Normal range of motion. Neck supple.  No meningismus  Cardiovascular: Normal rate, regular rhythm and normal heart sounds.   Pulmonary/Chest: Effort normal and breath sounds normal. No respiratory distress. She has no wheezes. She has no rales. She exhibits no tenderness.  Abdominal: Soft. Bowel sounds are normal. There is no tenderness. There is no rebound and no guarding.  Musculoskeletal: Normal range of motion. She exhibits no edema.  Lymphadenopathy:    She has no cervical adenopathy.  Neurological: She is alert and oriented to person, place, and time.  Skin: Skin is warm and dry. No rash noted.  Psychiatric: She has a normal mood and affect.    ED Course  Procedures (including critical care time)  DIAGNOSTIC STUDIES: Oxygen Saturation is 100% on RA, normal by my interpretation.    COORDINATION OF CARE: 10:02 PM-Discussed treatment plan which includes steroid injection with pt at bedside and pt agreed to  plan.   Labs Review Labs Reviewed - No data to display  Imaging Review No results found.   EKG Interpretation None      MDM   Final diagnoses:  Acute maxillary sinusitis, recurrence not specified   Patient presents with URI symptoms that started about 3 weeks ago. She was feeling a little bit better and over the last week has had worsening nasal congestion with more intense sinus pain and pressure that started today. She is nontoxic appearing. She's afebrile. Her lungs are clear without suggestions of pneumonia. She denies any chest congestion or shortness of breath other than she feels like her nose is stopped up. She has been started on Augmentin today which showed treat a possible sinus infection. She was also given a dose of Decadron in the ED. This should provide symptomatic relief. She will continue her over-the-counter cold medicines as well for symptomatically. She was encouraged to follow-up with her PCP if her symptoms are not improving or return here as needed for any worsening symptoms.  I personally performed the services described in this documentation,  which was scribed in my presence.  The recorded information has been reviewed and considered.       Rolan Bucco, MD 11/17/15 2211

## 2015-11-17 NOTE — Discharge Instructions (Signed)

## 2015-12-17 ENCOUNTER — Emergency Department (HOSPITAL_BASED_OUTPATIENT_CLINIC_OR_DEPARTMENT_OTHER): Payer: Medicaid Other

## 2015-12-17 ENCOUNTER — Encounter (HOSPITAL_BASED_OUTPATIENT_CLINIC_OR_DEPARTMENT_OTHER): Payer: Self-pay | Admitting: Emergency Medicine

## 2015-12-17 ENCOUNTER — Emergency Department (HOSPITAL_BASED_OUTPATIENT_CLINIC_OR_DEPARTMENT_OTHER)
Admission: EM | Admit: 2015-12-17 | Discharge: 2015-12-17 | Disposition: A | Discharge status: Home / Self Care | Payer: Medicaid Other | Attending: Emergency Medicine | Admitting: Emergency Medicine

## 2015-12-17 DIAGNOSIS — I10 Essential (primary) hypertension: Secondary | ICD-10-CM | POA: Diagnosis not present

## 2015-12-17 DIAGNOSIS — Z86718 Personal history of other venous thrombosis and embolism: Secondary | ICD-10-CM | POA: Diagnosis not present

## 2015-12-17 DIAGNOSIS — Z7952 Long term (current) use of systemic steroids: Secondary | ICD-10-CM | POA: Diagnosis not present

## 2015-12-17 DIAGNOSIS — Z87891 Personal history of nicotine dependence: Secondary | ICD-10-CM | POA: Diagnosis not present

## 2015-12-17 DIAGNOSIS — E669 Obesity, unspecified: Secondary | ICD-10-CM | POA: Insufficient documentation

## 2015-12-17 DIAGNOSIS — Z79899 Other long term (current) drug therapy: Secondary | ICD-10-CM | POA: Insufficient documentation

## 2015-12-17 DIAGNOSIS — J45901 Unspecified asthma with (acute) exacerbation: Secondary | ICD-10-CM | POA: Insufficient documentation

## 2015-12-17 DIAGNOSIS — Z792 Long term (current) use of antibiotics: Secondary | ICD-10-CM | POA: Diagnosis not present

## 2015-12-17 DIAGNOSIS — Z8673 Personal history of transient ischemic attack (TIA), and cerebral infarction without residual deficits: Secondary | ICD-10-CM | POA: Insufficient documentation

## 2015-12-17 DIAGNOSIS — R0602 Shortness of breath: Secondary | ICD-10-CM | POA: Diagnosis present

## 2015-12-17 DIAGNOSIS — G43909 Migraine, unspecified, not intractable, without status migrainosus: Secondary | ICD-10-CM | POA: Diagnosis not present

## 2015-12-17 LAB — CBC WITH DIFFERENTIAL/PLATELET
Basophils Absolute: 0 10*3/uL (ref 0.0–0.1)
Basophils Relative: 0 %
EOS PCT: 2 %
Eosinophils Absolute: 0.1 10*3/uL (ref 0.0–0.7)
HEMATOCRIT: 33.3 % — AB (ref 36.0–46.0)
Hemoglobin: 10.9 g/dL — ABNORMAL LOW (ref 12.0–15.0)
LYMPHS ABS: 4.4 10*3/uL — AB (ref 0.7–4.0)
LYMPHS PCT: 54 %
MCH: 25.3 pg — AB (ref 26.0–34.0)
MCHC: 32.7 g/dL (ref 30.0–36.0)
MCV: 77.3 fL — AB (ref 78.0–100.0)
MONO ABS: 0.8 10*3/uL (ref 0.1–1.0)
MONOS PCT: 10 %
NEUTROS ABS: 2.8 10*3/uL (ref 1.7–7.7)
Neutrophils Relative %: 34 %
PLATELETS: 359 10*3/uL (ref 150–400)
RBC: 4.31 MIL/uL (ref 3.87–5.11)
RDW: 16.6 % — AB (ref 11.5–15.5)
WBC: 8.1 10*3/uL (ref 4.0–10.5)

## 2015-12-17 LAB — TROPONIN I

## 2015-12-17 LAB — BASIC METABOLIC PANEL
ANION GAP: 7 (ref 5–15)
BUN: 12 mg/dL (ref 6–20)
CALCIUM: 8.5 mg/dL — AB (ref 8.9–10.3)
CO2: 23 mmol/L (ref 22–32)
Chloride: 105 mmol/L (ref 101–111)
Creatinine, Ser: 1.18 mg/dL — ABNORMAL HIGH (ref 0.44–1.00)
GFR calc Af Amer: >60 mL/min (ref >60)
GFR, EST NON AFRICAN AMERICAN: 56 mL/min — AB (ref >60)
GLUCOSE: 115 mg/dL — AB (ref 65–99)
Potassium: 3.6 mmol/L (ref 3.5–5.1)
Sodium: 135 mmol/L (ref 135–145)

## 2015-12-17 LAB — D-DIMER, QUANTITATIVE: D-Dimer, Quant: 0.41 ug/mL-FEU (ref 0.00–0.50)

## 2015-12-17 LAB — HCG, SERUM, QUALITATIVE: Preg, Serum: NEGATIVE

## 2015-12-17 LAB — BRAIN NATRIURETIC PEPTIDE: B NATRIURETIC PEPTIDE 5: 20.5 pg/mL (ref 0.0–100.0)

## 2015-12-17 MED ORDER — PREDNISONE 50 MG PO TABS
60.0000 mg | ORAL_TABLET | Freq: Once | ORAL | Status: AC
  Administered 2015-12-17: 60 mg via ORAL
  Filled 2015-12-17: qty 1

## 2015-12-17 MED ORDER — PREDNISONE 10 MG PO TABS: 50.0000 mg | ORAL_TABLET | Freq: Every day | ORAL | Status: DC

## 2015-12-17 MED ORDER — IPRATROPIUM-ALBUTEROL 0.5-2.5 (3) MG/3ML IN SOLN
3.0000 mL | Freq: Four times a day (QID) | RESPIRATORY_TRACT | Status: DC
  Administered 2015-12-17: 3 mL via RESPIRATORY_TRACT
  Filled 2015-12-17: qty 3

## 2015-12-17 MED ORDER — IPRATROPIUM-ALBUTEROL 0.5-2.5 (3) MG/3ML IN SOLN
3.0000 mL | Freq: Once | RESPIRATORY_TRACT | Status: AC
  Administered 2015-12-17: 3 mL via RESPIRATORY_TRACT
  Filled 2015-12-17: qty 3

## 2015-12-17 MED ORDER — ALBUTEROL SULFATE (2.5 MG/3ML) 0.083% IN NEBU
2.5000 mg | INHALATION_SOLUTION | Freq: Once | RESPIRATORY_TRACT | Status: AC
  Administered 2015-12-17: 2.5 mg via RESPIRATORY_TRACT
  Filled 2015-12-17: qty 3

## 2015-12-17 NOTE — ED Notes (Signed)
Patient comes in because she has been SOB - the patient reports that this started in Feb. She is to see an ENT tomorrow because of this problem

## 2015-12-17 NOTE — ED Notes (Signed)
Pt states this has been going on for awhile (since Feb 16th). Has been to her PCP and has tried various meds without relief. Had CXR which was "clear". NP cough. No swelling to ext. Just feels like she can't catch her breath.

## 2015-12-17 NOTE — ED Provider Notes (Signed)
CSN: [id number]     Arrival date & time 12/17/15  1619 History   First MD Initiated Contact with Patient 12/17/15 1624     Chief Complaint  Patient presents with  . Shortness of Breath     (Consider location/radiation/quality/duration/timing/severity/associated sxs/prior Treatment) HPI  45 year old female who presents with shortness of breath and cough. History of obesity, DVT no longer on anticoagulation, hypertension, and asthma. States that she has been intermittently sick over the past 2 weeks with sinus congestion, runny nose, and cough productive of yellow sputum. Has had subjective fever and chills, and increasing shortness of breath. States that earlier in February has taken a course of antibiotics, using her inhalers, and had been given a course of steroids with brief improvement but subsequent return of symptoms. Some chest tightness with shortness of breath and dyspnea on exertion, but no chest pain, syncope or near syncope. No extremity edema or pain, recent immobilization or traveling.  Past Medical History  Diagnosis Date  . Obesity   . Stroke (HCC)   . DVT (deep venous thrombosis) (HCC)   . High cholesterol   . Migraine   . Hypertension   . Asthma    Past Surgical History  Procedure Laterality Date  . Tubal ligation     History reviewed. No pertinent family history. Social History  Substance Use Topics  . Smoking status: Former Games developer  . Smokeless tobacco: Never Used  . Alcohol Use: Yes     Comment: 2 glasses wine/weekly   OB History    No data available     Review of Systems 10/14 systems reviewed and are negative other than those stated in the HPI   Allergies  Review of patient's allergies indicates no known allergies.  Home Medications   Prior to Admission medications   Medication Sig Start Date End Date Taking? Authorizing Provider  albuterol (PROVENTIL HFA;VENTOLIN HFA) 108 (90 BASE) MCG/ACT inhaler Inhale 2 puffs into the lungs every 4 (four)  hours as needed for wheezing or shortness of breath. 09/26/14   Gilda Crease, MD  amoxicillin (AMOXIL) 500 MG capsule Take 1 capsule (500 mg total) by mouth 3 (three) times daily. 09/26/14   Gilda Crease, MD  aspirin-acetaminophen-caffeine (EXCEDRIN MIGRAINE) [phone] MG per tablet Take 2 tablets by mouth every 6 (six) hours as needed for headache.    Historical Provider, MD  ibuprofen (ADVIL,MOTRIN) 600 MG tablet Take 600 mg by mouth every 6 (six) hours as needed. For pain    Historical Provider, MD  naproxen sodium (ANAPROX) 275 MG tablet Take 1 tablet (275 mg total) by mouth every 6 (six) hours as needed. 01/23/14   Leona Singleton, MD  ondansetron (ZOFRAN ODT) 4 MG disintegrating tablet Take 1 tablet (4 mg total) by mouth every 8 (eight) hours as needed for nausea. 01/11/14   Rolland Porter, MD  ORSYTHIA 0.1-20 MG-MCG tablet Take 1 tablet by mouth daily. 01/05/14   Historical Provider, MD  predniSONE (DELTASONE) 10 MG tablet Take 5 tablets (50 mg total) by mouth daily. 12/18/15   Lavera Guise, MD  predniSONE (DELTASONE) 20 MG tablet Take 2 tablets (40 mg total) by mouth daily with breakfast. 09/26/14   Gilda Crease, MD  promethazine-codeine [facility] WITH CODEINE) 6.25-10 MG/5ML syrup Take 5 mLs by mouth every 6 (six) hours as needed for cough. 09/09/14   Garlon Hatchet, PA-C  SUMAtriptan (IMITREX) 25 MG tablet Take 1 tablet (25 mg total) by mouth every 2 (two) hours as  needed for migraine or headache. May repeat in 2 hours if headache persists or recurs. 01/11/14   Rolland Porter, MD   BP 132/64 mmHg  Pulse 94  Temp(Src) 98.1 F (36.7 C) (Oral)  Resp 24  Ht  (1.626 m)  Wt 360 lb (163.295 kg)  BMI 61.76 kg/m2  SpO2 99%  LMP 12/03/2015 Physical Exam Physical Exam  Nursing note and vitals reviewed. Constitutional: Well developed, well nourished, non-toxic, and in no acute distress Head: Normocephalic and atraumatic.  Mouth/Throat: Oropharynx is clear and moist.   Neck: Normal range of motion. Neck supple.  Cardiovascular: Normal rate and regular rhythm. No LE edema.   Pulmonary/Chest: No conversational dyspnea. Diminished breath sounds throughout, with bronchospastic cough and wheezes. Abdominal: Soft. Obese. There is no tenderness. There is no rebound and no guarding.  Musculoskeletal: Normal range of motion.  Neurological: Alert, no facial droop, fluent speech, moves all extremities symmetrically Skin: Skin is warm and dry.  Psychiatric: Cooperative  ED Course  Procedures (including critical care time) Labs Review Labs Reviewed  CBC WITH DIFFERENTIAL/PLATELET - Abnormal; Notable for the following:    Hemoglobin 10.9 (*)    HCT 33.3 (*)    MCV 77.3 (*)    MCH 25.3 (*)    RDW 16.6 (*)    Lymphs Abs 4.4 (*)    All other components within normal limits  BASIC METABOLIC PANEL - Abnormal; Notable for the following:    Glucose, Bld 115 (*)    Creatinine, Ser 1.18 (*)    Calcium 8.5 (*)    GFR calc non Af Amer 56 (*)    All other components within normal limits  HCG, SERUM, QUALITATIVE  TROPONIN I  BRAIN NATRIURETIC PEPTIDE  D-DIMER, QUANTITATIVE (NOT AT [facility])    Imaging Review Dg Chest 2 View  12/17/2015  CLINICAL DATA:  Cough and shortness of breath for 2 months, history asthma, hypertension, stroke, former smoker EXAM: CHEST  2 VIEW COMPARISON:  12/09/2015 FINDINGS: Enlargement of cardiac silhouette. Mediastinal contours and pulmonary vascularity normal. Decreased peribronchial thickening. No infiltrate, pleural effusion or pneumothorax. Bones unremarkable. IMPRESSION: No acute abnormalities. Enlargement of cardiac silhouette. Electronically Signed   By: Ulyses Southward M.D.   On: 12/17/2015 17:42   I have personally reviewed and evaluated these images and lab results as part of my medical decision-making.   EKG Interpretation   Date/Time:  Tuesday December 17 2015 17:06:29 EDT Ventricular Rate:  92 PR Interval:  137 QRS Duration: 105 QT  Interval:  357 QTC Calculation: 442 R Axis:   31 Text Interpretation:  Sinus rhythm Abnormal R-wave progression, early  transition No significant change since last tracing Confirmed by Ascencion Stegner MD,  Sybol Morre ([id number]) on 12/17/2015 5:26:35 PM      MDM   Final diagnoses:  Asthma exacerbation   45 year old female who presents with shortness of breath and cough. Presentation is nontoxic, mildly tachypnea with no conversational dyspnea and with normal oxygenation. With frequent bronchospastic cough and occasional wheeze on lung exam. Does not have any evidence of fluid overload. Given 3 breathing treatments and steroids, with symptomatic improvement. Chest x-ray without infiltrate or other acute processes. EKG nonischemic, and troponin is negative. Presentation not consistent with that of ACS and felt adequately ruled out especially since she is low risk. A d-dimer was sent given her prior history of DVT, although clinical suspicion for this is low and she is of moderate Wells criteria. This is negative and I do not feel that she  requires any further CT imaging for evaluation of PE. Given symptoms improved with asthma treatment, she'll be sent home with a course of steroids. She will follow-up closely with her primary care physician for reevaluation. She has been able to ambulate in the emergency Department without difficulty or hypoxia. Strict return and follow-up instructions are reviewed. She expressed understanding of all discharge instructions and felt comfortable to plan of care.    Lavera Guiseana Duo Garrit Marrow, MD 12/17/15 2010

## 2015-12-17 NOTE — ED Notes (Signed)
Per physician order patient was ambulated via pulse ox. Patient maintained a oxygen saturation of 95-99% with a heart rate of 109-122. Patient stated she felt improved from medication and felt well enough to go home. Patient returned to room and attached to monitors. Patient tolerated well.

## 2015-12-17 NOTE — Discharge Instructions (Signed)
Continue to give yourself inhaler treatments every 4 hours as needed for shortness of breath. Take steroids as prescribed. Return for worsening symptoms including worsening pain, difficulty breathing, fevers, or any other symptoms concerning to you.  Asthma, Acute Bronchospasm Acute bronchospasm caused by asthma is also referred to as an asthma attack. Bronchospasm means your air passages become narrowed. The narrowing is caused by inflammation and tightening of the muscles in the air tubes (bronchi) in your lungs. This can make it hard to breathe or cause you to wheeze and cough. CAUSES Possible triggers are:  Animal dander from the skin, hair, or feathers of animals.  Dust mites contained in house dust.  Cockroaches.  Pollen from trees or grass.  Mold.  Cigarette or tobacco smoke.  Air pollutants such as dust, household cleaners, hair sprays, aerosol sprays, paint fumes, strong chemicals, or strong odors.  Cold air or weather changes. Cold air may trigger inflammation. Winds increase molds and pollens in the air.  Strong emotions such as crying or laughing hard.  Stress.  Certain medicines such as aspirin or beta-blockers.  Sulfites in foods and drinks, such as dried fruits and wine.  Infections or inflammatory conditions, such as a flu, cold, or inflammation of the nasal membranes (rhinitis).  Gastroesophageal reflux disease (GERD). GERD is a condition where stomach acid backs up into your esophagus.  Exercise or strenuous activity. SIGNS AND SYMPTOMS   Wheezing.  Excessive coughing, particularly at night.  Chest tightness.  Shortness of breath. DIAGNOSIS  Your health care provider will ask you about your medical history and perform a physical exam. A chest X-ray or blood testing may be performed to look for other causes of your symptoms or other conditions that may have triggered your asthma attack. TREATMENT  Treatment is aimed at reducing inflammation and  opening up the airways in your lungs. Most asthma attacks are treated with inhaled medicines. These include quick relief or rescue medicines (such as bronchodilators) and controller medicines (such as inhaled corticosteroids). These medicines are sometimes given through an inhaler or a nebulizer. Systemic steroid medicine taken by mouth or given through an IV tube also can be used to reduce the inflammation when an attack is moderate or severe. Antibiotic medicines are only used if a bacterial infection is present.  HOME CARE INSTRUCTIONS   Rest.  Drink plenty of liquids. This helps the mucus to remain thin and be easily coughed up. Only use caffeine in moderation and do not use alcohol until you have recovered from your illness.  Do not smoke. Avoid being exposed to secondhand smoke.  You play a critical role in keeping yourself in good health. Avoid exposure to things that cause you to wheeze or to have breathing problems.  Keep your medicines up-to-date and available. Carefully follow your health care provider's treatment plan.  Take your medicine exactly as prescribed.  When pollen or pollution is bad, keep windows closed and use an air conditioner or go to places with air conditioning.  Asthma requires careful medical care. See your health care provider for a follow-up as advised. If you are more than [redacted] weeks pregnant and you were prescribed any new medicines, let your obstetrician know about the visit and how you are doing. Follow up with your health care provider as directed.  After you have recovered from your asthma attack, make an appointment with your outpatient doctor to talk about ways to reduce the likelihood of future attacks. If you do not have a doctor  who manages your asthma, make an appointment with a primary care doctor to discuss your asthma. SEEK IMMEDIATE MEDICAL CARE IF:   You are getting worse.  You have trouble breathing. If severe, call your local emergency  services (911 in the [place]  You develop chest pain or discomfort.  You are vomiting.  You are not able to keep fluids down.  You are coughing up yellow, green, brown, or bloody sputum.  You have a fever and your symptoms suddenly get worse.  You have trouble swallowing. MAKE SURE YOU:   Understand these instructions.  Will watch your condition.  Will get help right away if you are not doing well or get worse.   This information is not intended to replace advice given to you by your health care provider. Make sure you discuss any questions you have with your health care provider.   Document Released: 12/16/2006 Document Revised: 09/05/2013 Document Reviewed: 03/08/2013 Elsevier Interactive Patient Education [employer].

## 2017-01-11 ENCOUNTER — Emergency Department (HOSPITAL_BASED_OUTPATIENT_CLINIC_OR_DEPARTMENT_OTHER): Payer: Medicaid Other

## 2017-01-11 ENCOUNTER — Observation Stay (HOSPITAL_BASED_OUTPATIENT_CLINIC_OR_DEPARTMENT_OTHER)
Admission: EM | Admit: 2017-01-11 | Discharge: 2017-01-13 | Disposition: A | Discharge status: Home / Self Care | Payer: Medicaid Other | Attending: Internal Medicine | Admitting: Internal Medicine

## 2017-01-11 ENCOUNTER — Encounter (HOSPITAL_BASED_OUTPATIENT_CLINIC_OR_DEPARTMENT_OTHER): Payer: Self-pay | Admitting: Emergency Medicine

## 2017-01-11 DIAGNOSIS — Z8249 Family history of ischemic heart disease and other diseases of the circulatory system: Secondary | ICD-10-CM | POA: Insufficient documentation

## 2017-01-11 DIAGNOSIS — I10 Essential (primary) hypertension: Secondary | ICD-10-CM

## 2017-01-11 DIAGNOSIS — R110 Nausea: Secondary | ICD-10-CM | POA: Insufficient documentation

## 2017-01-11 DIAGNOSIS — E7800 Pure hypercholesterolemia, unspecified: Secondary | ICD-10-CM

## 2017-01-11 DIAGNOSIS — R079 Chest pain, unspecified: Secondary | ICD-10-CM | POA: Diagnosis not present

## 2017-01-11 DIAGNOSIS — Z6841 Body Mass Index (BMI) 40.0 and over, adult: Secondary | ICD-10-CM | POA: Diagnosis not present

## 2017-01-11 DIAGNOSIS — R0789 Other chest pain: Principal | ICD-10-CM | POA: Insufficient documentation

## 2017-01-11 DIAGNOSIS — Z86718 Personal history of other venous thrombosis and embolism: Secondary | ICD-10-CM | POA: Diagnosis not present

## 2017-01-11 DIAGNOSIS — J45909 Unspecified asthma, uncomplicated: Secondary | ICD-10-CM | POA: Diagnosis not present

## 2017-01-11 DIAGNOSIS — G43909 Migraine, unspecified, not intractable, without status migrainosus: Secondary | ICD-10-CM | POA: Insufficient documentation

## 2017-01-11 DIAGNOSIS — Z8673 Personal history of transient ischemic attack (TIA), and cerebral infarction without residual deficits: Secondary | ICD-10-CM | POA: Insufficient documentation

## 2017-01-11 DIAGNOSIS — Z87891 Personal history of nicotine dependence: Secondary | ICD-10-CM | POA: Insufficient documentation

## 2017-01-11 DIAGNOSIS — I2510 Atherosclerotic heart disease of native coronary artery without angina pectoris: Secondary | ICD-10-CM | POA: Diagnosis not present

## 2017-01-11 HISTORY — DX: Transient cerebral ischemic attack, unspecified: G45.9

## 2017-01-11 LAB — CBC WITH DIFFERENTIAL/PLATELET
BASOS ABS: 0 10*3/uL (ref 0.0–0.1)
BASOS PCT: 1 %
EOS ABS: 0.1 10*3/uL (ref 0.0–0.7)
EOS PCT: 2 %
HCT: 36.2 % (ref 36.0–46.0)
Hemoglobin: 11.7 g/dL — ABNORMAL LOW (ref 12.0–15.0)
LYMPHS PCT: 49 %
Lymphs Abs: 3.7 10*3/uL (ref 0.7–4.0)
MCH: 25.3 pg — ABNORMAL LOW (ref 26.0–34.0)
MCHC: 32.3 g/dL (ref 30.0–36.0)
MCV: 78.2 fL (ref 78.0–100.0)
Monocytes Absolute: 0.6 10*3/uL (ref 0.1–1.0)
Monocytes Relative: 9 %
Neutro Abs: 2.8 10*3/uL (ref 1.7–7.7)
Neutrophils Relative %: 39 %
PLATELETS: 360 10*3/uL (ref 150–400)
RBC: 4.63 MIL/uL (ref 3.87–5.11)
RDW: 16.2 % — ABNORMAL HIGH (ref 11.5–15.5)
WBC: 7.3 10*3/uL (ref 4.0–10.5)

## 2017-01-11 LAB — BASIC METABOLIC PANEL
Anion gap: 10 (ref 5–15)
BUN: 12 mg/dL (ref 6–20)
CALCIUM: 9 mg/dL (ref 8.9–10.3)
CHLORIDE: 103 mmol/L (ref 101–111)
CO2: 23 mmol/L (ref 22–32)
CREATININE: 0.98 mg/dL (ref 0.44–1.00)
Glucose, Bld: 109 mg/dL — ABNORMAL HIGH (ref 65–99)
Potassium: 3.5 mmol/L (ref 3.5–5.1)
SODIUM: 136 mmol/L (ref 135–145)

## 2017-01-11 LAB — TROPONIN I
TROPONIN I: 0.03 ng/mL — AB (ref <0.03)
Troponin I: <0.03 ng/mL (ref <0.03)

## 2017-01-11 LAB — D-DIMER, QUANTITATIVE (NOT AT ARMC): D DIMER QUANT: 0.63 ug{FEU}/mL — AB (ref 0.00–0.50)

## 2017-01-11 LAB — BRAIN NATRIURETIC PEPTIDE: B NATRIURETIC PEPTIDE 5: 24.1 pg/mL (ref 0.0–100.0)

## 2017-01-11 MED ORDER — ONDANSETRON HCL 4 MG/2ML IJ SOLN: 4.0000 mg | Freq: Four times a day (QID) | INTRAMUSCULAR | Status: DC | PRN

## 2017-01-11 MED ORDER — DIPHENHYDRAMINE HCL 50 MG/ML IJ SOLN
25.0000 mg | Freq: Once | INTRAMUSCULAR | Status: AC
  Administered 2017-01-11: 25 mg via INTRAVENOUS
  Filled 2017-01-11: qty 1

## 2017-01-11 MED ORDER — PROCHLORPERAZINE EDISYLATE 5 MG/ML IJ SOLN
10.0000 mg | Freq: Once | INTRAMUSCULAR | Status: AC
  Administered 2017-01-11: 10 mg via INTRAVENOUS
  Filled 2017-01-11: qty 2

## 2017-01-11 MED ORDER — NITROGLYCERIN 0.4 MG SL SUBL
0.4000 mg | SUBLINGUAL_TABLET | SUBLINGUAL | Status: DC | PRN
  Administered 2017-01-11: 0.4 mg via SUBLINGUAL
  Filled 2017-01-11: qty 1

## 2017-01-11 MED ORDER — LABETALOL HCL 5 MG/ML IV SOLN
5.0000 mg | INTRAVENOUS | Status: DC | PRN
  Administered 2017-01-12: 5 mg via INTRAVENOUS
  Filled 2017-01-11 (×2): qty 4

## 2017-01-11 MED ORDER — ASPIRIN 81 MG PO CHEW
324.0000 mg | CHEWABLE_TABLET | Freq: Once | ORAL | Status: AC
  Administered 2017-01-11: 324 mg via ORAL
  Filled 2017-01-11: qty 4

## 2017-01-11 MED ORDER — ENOXAPARIN SODIUM 40 MG/0.4ML ~~LOC~~ SOLN
40.0000 mg | SUBCUTANEOUS | Status: DC
  Administered 2017-01-11: 40 mg via SUBCUTANEOUS
  Filled 2017-01-11: qty 0.4

## 2017-01-11 MED ORDER — ACETAMINOPHEN 325 MG PO TABS
650.0000 mg | ORAL_TABLET | ORAL | Status: DC | PRN
Start: 2017-01-11 — End: 2017-01-13
  Administered 2017-01-12 – 2017-01-13 (×3): 650 mg via ORAL
  Filled 2017-01-11 (×3): qty 2

## 2017-01-11 MED ORDER — IOPAMIDOL (ISOVUE-370) INJECTION 76%
100.0000 mL | Freq: Once | INTRAVENOUS | Status: AC | PRN
  Administered 2017-01-11: 100 mL via INTRAVENOUS

## 2017-01-11 NOTE — H&P (Signed)
History and Physical    AARIKA MOON ZOX:[id number] DOB: 17-Sep-1970 DOA: 01/11/2017  PCP: [name], MD  Patient coming from: Home via [facility]  I have personally briefly reviewed patient's old medical records in [facility]  Chief Complaint: Chest pain  HPI: KOLLYNS MICKELSON is a 46 y.o. female with medical history significant of migraines, HTN, HLD, TIA and DVT.  Patient presents to the ED with c/o headaches x 1 week, nausea x yesterday that has resolved.  Intermittent chest pain x 2 days: "pressure" like sensation, nothing seems to make better or worse although it improved with NTG in the ambulance.  Symptoms were intermittent but became constant 6 hours ago and were relieved with NTG SL.  No cough, congestion, fevers, or other symptoms.  No long distance travel, no h/o DM.   ED Course: CTA PE is negative.  Trop is 0.03.   Review of Systems: As per HPI otherwise 10 point review of systems negative.   Past Medical History:  Diagnosis Date  . Asthma   . DVT (deep venous thrombosis) (HCC)   . High cholesterol   . Hypertension   . Migraine   . Obesity   . Stroke (HCC)   . TIA (transient ischemic attack)     Past Surgical History:  Procedure Laterality Date  . TUBAL LIGATION       reports that she has quit smoking. She has never used smokeless tobacco. She reports that she drinks alcohol. She reports that she does not use drugs.  No Known Allergies  History reviewed. No pertinent family history.   Prior to Admission medications   Medication Sig Start Date End Date Taking? Authorizing Provider  aspirin-acetaminophen-caffeine (EXCEDRIN MIGRAINE) [phone] MG per tablet Take 2 tablets by mouth every 6 (six) hours as needed for headache.   Yes Historical Provider, MD  Multiple Vitamin (MULTIVITAMIN) capsule Take 1 capsule by mouth daily.   Yes Historical Provider, MD    Physical Exam: Vitals:   01/11/17 2030 01/11/17 2045 01/11/17 2100 01/11/17 2150  BP: (!)  138/99   (!) 179/85  Pulse: 78   77  Resp: (!) 22 (!) 33 (!) 26 20  Temp:    98.1 F (36.7 C)  TempSrc:    Oral  SpO2: 97%   100%  Weight:    (!) 166.1 kg (366 lb 2.9 oz)  Height:     (1.626 m)    Constitutional: NAD, calm, comfortable Eyes: PERRL, lids and conjunctivae normal ENMT: Mucous membranes are moist. Posterior pharynx clear of any exudate or lesions.Normal dentition.  Neck: normal, supple, no masses, no thyromegaly Respiratory: clear to auscultation bilaterally, no wheezing, no crackles. Normal respiratory effort. No accessory muscle use.  Cardiovascular: Regular rate and rhythm, no murmurs / rubs / gallops. No extremity edema. 2+ pedal pulses. No carotid bruits.  Abdomen: no tenderness, no masses palpated. No hepatosplenomegaly. Bowel sounds positive.  Musculoskeletal: no clubbing / cyanosis. No joint deformity upper and lower extremities. Good ROM, no contractures. Normal muscle tone.  Skin: no rashes, lesions, ulcers. No induration Neurologic: CN 2-12 grossly intact. Sensation intact, DTR normal. Strength 5/5 in all 4.  Psychiatric: Normal judgment and insight. Alert and oriented x 3. Normal mood.    Labs on Admission: I have personally reviewed following labs and imaging studies  CBC:  Recent Labs Lab 01/11/17 1758  WBC 7.3  NEUTROABS 2.8  HGB 11.7*  HCT 36.2  MCV 78.2  PLT 360   Basic  Metabolic Panel:  Recent Labs Lab 01/11/17 1758  NA 136  K 3.5  CL 103  CO2 23  GLUCOSE 109*  BUN 12  CREATININE 0.98  CALCIUM 9.0   GFR: Estimated Creatinine Clearance: 114.8 mL/min (by C-G formula based on SCr of 0.98 mg/dL). Liver Function Tests: No results for input(s): AST, ALT, ALKPHOS, BILITOT, PROT, ALBUMIN in the last 168 hours. No results for input(s): LIPASE, AMYLASE in the last 168 hours. No results for input(s): AMMONIA in the last 168 hours. Coagulation Profile: No results for input(s): INR, PROTIME in the last 168 hours. Cardiac  Enzymes:  Recent Labs Lab 01/11/17 1758  TROPONINI 0.03*   BNP (last 3 results) No results for input(s): PROBNP in the last 8760 hours. HbA1C: No results for input(s): HGBA1C in the last 72 hours. CBG: No results for input(s): GLUCAP in the last 168 hours. Lipid Profile: No results for input(s): CHOL, HDL, LDLCALC, TRIG, CHOLHDL, LDLDIRECT in the last 72 hours. Thyroid Function Tests: No results for input(s): TSH, T4TOTAL, FREET4, T3FREE, THYROIDAB in the last 72 hours. Anemia Panel: No results for input(s): VITAMINB12, FOLATE, FERRITIN, TIBC, IRON, RETICCTPCT in the last 72 hours. Urine analysis:    Component Value Date/Time   COLORURINE YELLOW 10/04/2013 1942   APPEARANCEUR CLEAR 10/04/2013 1942   LABSPEC 1.031 (H) 10/04/2013 1942   PHURINE 6.0 10/04/2013 1942   GLUCOSEU NEGATIVE 10/04/2013 1942   HGBUR NEGATIVE 10/04/2013 1942   BILIRUBINUR NEGATIVE 10/04/2013 1942   KETONESUR NEGATIVE 10/04/2013 1942   PROTEINUR NEGATIVE 10/04/2013 1942   UROBILINOGEN 1.0 10/04/2013 1942   NITRITE NEGATIVE 10/04/2013 1942   LEUKOCYTESUR NEGATIVE 10/04/2013 1942    Radiological Exams on Admission: Ct Angio Chest Pe W And/or Wo Contrast  Result Date: 01/11/2017 CLINICAL DATA:  Elevated D-dimer. Chest pain. History of deep venous thrombosis. EXAM: CT ANGIOGRAPHY CHEST WITH CONTRAST TECHNIQUE: Multidetector CT imaging of the chest was performed using the standard protocol during bolus administration of intravenous contrast. Multiplanar CT image reconstructions and MIPs were obtained to evaluate the vascular anatomy. CONTRAST:  100 cc Isovue 370 IV. COMPARISON:  12/17/2015 chest radiograph. 06/08/2012 chest CT angiogram. FINDINGS: Cardiovascular: The study is moderate quality for the evaluation of pulmonary embolism, with evaluation of the segmental and subsegmental pulmonary arteries limited by motion artifact and slightly limited contrast opacification. There are no filling defects in the  central, lobar, segmental or subsegmental pulmonary artery branches to suggest acute pulmonary embolism. Great vessels are normal in course and caliber. Top-normal heart size. No significant pericardial fluid/thickening. Mediastinum/Nodes: No discrete thyroid nodules. Unremarkable esophagus. No pathologically enlarged axillary, mediastinal or hilar lymph nodes. Lungs/Pleura: No pneumothorax. No pleural effusion. Peripheral left lower lobe 4 mm solid pulmonary nodule (series 8/ image 51) is stable since 06/08/2012 and considered benign. Curvilinear parenchymal band in the dependent right lower lobe is compatible with postinfectious/postinflammatory scarring, unchanged. No acute consolidative airspace disease, lung masses or new significant pulmonary nodules. Upper abdomen: Unremarkable. Musculoskeletal: No aggressive appearing focal osseous lesions. Mild thoracic spondylosis. Review of the MIP images confirms the above findings. IMPRESSION: No evidence of pulmonary embolism.  No acute disease in the chest. Electronically Signed   By: Delbert Phenix M.D.   On: 01/11/2017 19:31    EKG: Independently reviewed.  Assessment/Plan Principal Problem:   Chest pain with moderate risk for cardiac etiology    1. Chest pain - HEART score of 4 1. CP obs pathway 2. Serial trops 3. NPO after midnight 4. Tele monitor 5. Call  cards in AM re: need for stress test? 2. h/o HTN - 1. PRN labetalol 2. Doesn't appear to be on outpatient meds based on PCPs notes reviewed in care everywhere  DVT prophylaxis: Lovenox Code Status: Full Family Communication: No family in room Disposition Plan: Home after admit Consults called: None Admission status: Place in obs   Hillary Bow. DO [facility] Pager [phone]  If 7AM-7PM, please contact day team taking care of patient [email or web address] Password [email or web address]  01/11/2017, 10:19 PM

## 2017-01-11 NOTE — ED Triage Notes (Signed)
Pt had intermittent migraine for over one week with some weakness and left arm sensation.   Headache in the same area but more frequent and lasting longer.   Pt states she has had intermittent chest pain since Friday which has slowly gotten more consistent and more strong.  Pt also noted dark circle under eyes, left worse than right.

## 2017-01-11 NOTE — ED Provider Notes (Signed)
[facility] Provider Note   CSN: [id number] & time: 01/11/17  1729   By signing my name below, I, Clovis Pu, attest that this documentation has been prepared under the direction and in the presence of Melene Plan, DO  Electronically Signed: Clovis Pu, ED Scribe. 01/11/17. 6:10 PM.   History   Chief Complaint Chief Complaint  Patient presents with  . Chest Pain  . Headache    HPI Comments:  Terri Boyd is a 46 y.o. female, with a PMHx of migraines, HTN, high cholesterol, TIA and DVT, who presents to the Emergency Department complaining of acute onset, intermittent episodes of moderate headaches x 1 week. She also reports resolved nausea x yesterday. Pt also reports intermittent chest pain x 2 days that became constant 6 hours ago. She describes her pain as a pressure-like sensation. No alleviating or aggravating factors noted. Pt denies cough, congestion, fevers or any other associated symptoms. Pt also denies any injuries to her chest, recent hospitalizations, any recent long distance travel or a hx of DM.   The history is provided by the patient. No language interpreter was used.  Chest Pain   This is a new problem. The current episode started 2 days ago. The problem occurs constantly. The problem has not changed since onset.The pain is present in the substernal region. The pain is moderate. The quality of the pain is described as pressure-like. The pain does not radiate. Associated symptoms include headaches and nausea (resolved). Pertinent negatives include no cough, no dizziness, no fever, no palpitations, no shortness of breath and no vomiting. She has tried nothing for the symptoms. The treatment provided no relief. Risk factors include obesity.  Her past medical history is significant for DVT and hypertension.  Her family medical history is significant for heart disease.  Headache   This is a recurrent problem. The current episode started more than  2 days ago. The problem has not changed since onset.The headache is associated with nothing. The pain is moderate. The pain does not radiate. Associated symptoms include nausea (resolved). Pertinent negatives include no fever, no palpitations, no shortness of breath and no vomiting. She has tried nothing for the symptoms.    Past Medical History:  Diagnosis Date  . Asthma   . DVT (deep venous thrombosis) (HCC)   . High cholesterol   . Hypertension   . Migraine   . Obesity   . Stroke (HCC)   . TIA (transient ischemic attack)     Patient Active Problem List   Diagnosis Date Noted  . Chest pain with moderate risk for cardiac etiology 01/11/2017  . Right shoulder pain 02/17/2013  . Migraine     Past Surgical History:  Procedure Laterality Date  . TUBAL LIGATION      OB History    No data available       Home Medications    Prior to Admission medications   Medication Sig Start Date End Date Taking? Authorizing Provider  aspirin-acetaminophen-caffeine (EXCEDRIN MIGRAINE) [phone] MG per tablet Take 2 tablets by mouth every 6 (six) hours as needed for headache.   Yes Historical Provider, MD  Multiple Vitamin (MULTIVITAMIN) capsule Take 1 capsule by mouth daily.   Yes Historical Provider, MD    Family History History reviewed. No pertinent family history.  Social History Social History  Substance Use Topics  . Smoking status: Former Games developer  . Smokeless tobacco: Never Used  . Alcohol use Yes     Comment:  2 glasses wine/weekly     Allergies   Patient has no known allergies.   Review of Systems Review of Systems  Constitutional: Negative for chills and fever.  HENT: Negative for congestion and rhinorrhea.   Eyes: Negative for redness and visual disturbance.  Respiratory: Negative for cough, shortness of breath and wheezing.   Cardiovascular: Positive for chest pain. Negative for palpitations.  Gastrointestinal: Positive for nausea (resolved). Negative for  vomiting.  Genitourinary: Negative for dysuria and urgency.  Musculoskeletal: Negative for arthralgias and myalgias.  Skin: Negative for pallor and wound.  Neurological: Positive for headaches. Negative for dizziness.     Physical Exam Updated Vital Signs BP (!) 179/85 (BP Location: Left Arm)   Pulse 77   Temp 98.1 F (36.7 C) (Oral)   Resp 20   Ht  (1.626 m)   Wt (!) 366 lb 2.9 oz (166.1 kg)   LMP 01/04/2017   SpO2 100%   BMI 62.86 kg/m   Physical Exam  Constitutional: She is oriented to person, place, and time. She appears well-developed and well-nourished. No distress.  Morbidly obese  HENT:  Head: Normocephalic and atraumatic.  Eyes: EOM are normal. Pupils are equal, round, and reactive to light.  Neck: Normal range of motion. Neck supple.  Cardiovascular: Normal rate and regular rhythm.  Exam reveals no gallop and no friction rub.   No murmur heard. Pulmonary/Chest: Effort normal. She has no wheezes. She has no rales.  Abdominal: Soft. She exhibits no distension. There is no tenderness.  Musculoskeletal: She exhibits no edema or tenderness.  Neurological: She is alert and oriented to person, place, and time.  Skin: Skin is warm and dry. She is not diaphoretic.  Psychiatric: She has a normal mood and affect. Her behavior is normal.  Nursing note and vitals reviewed.    ED Treatments / Results  DIAGNOSTIC STUDIES:  Oxygen Saturation is 99% on RA, normal by my interpretation.    COORDINATION OF CARE:  6:06 PM Discussed treatment plan with pt at bedside and pt agreed to plan.  Labs (all labs ordered are listed, but only abnormal results are displayed) Labs Reviewed  BASIC METABOLIC PANEL - Abnormal; Notable for the following:       Result Value   Glucose, Bld 109 (*)    All other components within normal limits  TROPONIN I - Abnormal; Notable for the following:    Troponin I 0.03 (*)    All other components within normal limits  CBC WITH  DIFFERENTIAL/PLATELET - Abnormal; Notable for the following:    Hemoglobin 11.7 (*)    MCH 25.3 (*)    RDW 16.2 (*)    All other components within normal limits  D-DIMER, QUANTITATIVE (NOT AT [facility]) - Abnormal; Notable for the following:    D-Dimer, Quant 0.63 (*)    All other components within normal limits  BRAIN NATRIURETIC PEPTIDE  HIV ANTIBODY (ROUTINE TESTING)  TROPONIN I  TROPONIN I  TROPONIN I  URINALYSIS, ROUTINE W REFLEX MICROSCOPIC  PREGNANCY, URINE    EKG  EKG Interpretation  Date/Time:  Monday January 11 2017 17:44:41 EDT Ventricular Rate:  88 PR Interval:  136 QRS Duration: 90 QT Interval:  364 QTC Calculation: 440 R Axis:   47 Text Interpretation:  Normal sinus rhythm Normal ECG low t waves amplitude Otherwise no significant change Confirmed by Jacon Whetzel MD, DANIEL ([id number]) on 01/11/2017 5:51:05 PM       Radiology Ct Angio Chest Pe W And/or Wo Contrast  Result Date: 01/11/2017 CLINICAL DATA:  Elevated D-dimer. Chest pain. History of deep venous thrombosis. EXAM: CT ANGIOGRAPHY CHEST WITH CONTRAST TECHNIQUE: Multidetector CT imaging of the chest was performed using the standard protocol during bolus administration of intravenous contrast. Multiplanar CT image reconstructions and MIPs were obtained to evaluate the vascular anatomy. CONTRAST:  100 cc Isovue 370 IV. COMPARISON:  12/17/2015 chest radiograph. 06/08/2012 chest CT angiogram. FINDINGS: Cardiovascular: The study is moderate quality for the evaluation of pulmonary embolism, with evaluation of the segmental and subsegmental pulmonary arteries limited by motion artifact and slightly limited contrast opacification. There are no filling defects in the central, lobar, segmental or subsegmental pulmonary artery branches to suggest acute pulmonary embolism. Great vessels are normal in course and caliber. Top-normal heart size. No significant pericardial fluid/thickening. Mediastinum/Nodes: No discrete thyroid nodules.  Unremarkable esophagus. No pathologically enlarged axillary, mediastinal or hilar lymph nodes. Lungs/Pleura: No pneumothorax. No pleural effusion. Peripheral left lower lobe 4 mm solid pulmonary nodule (series 8/ image 51) is stable since 06/08/2012 and considered benign. Curvilinear parenchymal band in the dependent right lower lobe is compatible with postinfectious/postinflammatory scarring, unchanged. No acute consolidative airspace disease, lung masses or new significant pulmonary nodules. Upper abdomen: Unremarkable. Musculoskeletal: No aggressive appearing focal osseous lesions. Mild thoracic spondylosis. Review of the MIP images confirms the above findings. IMPRESSION: No evidence of pulmonary embolism.  No acute disease in the chest. Electronically Signed   By: Delbert Phenix M.D.   On: 01/11/2017 19:31    Procedures Procedures (including critical care time)  Medications Ordered in ED Medications  nitroGLYCERIN (NITROSTAT) SL tablet 0.4 mg (0.4 mg Sublingual Given 01/11/17 1821)  acetaminophen (TYLENOL) tablet 650 mg (not administered)  ondansetron (ZOFRAN) injection 4 mg (not administered)  enoxaparin (LOVENOX) injection 40 mg (40 mg Subcutaneous Given 01/11/17 2249)  labetalol (NORMODYNE,TRANDATE) injection 5 mg (not administered)  aspirin chewable tablet 324 mg (324 mg Oral Given 01/11/17 1820)  prochlorperazine (COMPAZINE) injection 10 mg (10 mg Intravenous Given 01/11/17 1826)  diphenhydrAMINE (BENADRYL) injection 25 mg (25 mg Intravenous Given 01/11/17 1823)  iopamidol (ISOVUE-370) 76 % injection 100 mL (100 mLs Intravenous Contrast Given 01/11/17 1904)     Initial Impression / Assessment and Plan / ED Course  I have reviewed the triage vital signs and the nursing notes.  Pertinent labs & imaging results that were available during my care of the patient were reviewed by me and considered in my medical decision making (see chart for details).     46 yo F With a chief complaint of  chest pain. Feels like pressure some radiation to the left arm. Nonexertional. This pain is been going on for the past couple days and slowly worsening. Also complaining of headaches off and on and feel like her migraines. Have been lasting slightly longer than normal as well. Has a history of a DVT. Patient's d-dimer was positive. Troponin was also very minimally positive. CT angiogram of the chest without PE. As patient has a very mild + trop will admit to hospitalist for cp rule out.   CRITICAL CARE Performed by: Rae Roam   Total critical care time: 35 minutes  Critical care time was exclusive of separately billable procedures and treating other patients.  Critical care was necessary to treat or prevent imminent or life-threatening deterioration.  Critical care was time spent personally by me on the following activities: development of treatment plan with patient and/or surrogate as well as nursing, discussions with consultants, evaluation of patient's response to  treatment, examination of patient, obtaining history from patient or surrogate, ordering and performing treatments and interventions, ordering and review of laboratory studies, ordering and review of radiographic studies, pulse oximetry and re-evaluation of patient's condition.  The patients results and plan were reviewed and discussed.   Any x-rays performed were independently reviewed by myself.   Differential diagnosis were considered with the presenting HPI.  Medications  nitroGLYCERIN (NITROSTAT) SL tablet 0.4 mg (0.4 mg Sublingual Given 01/11/17 1821)  acetaminophen (TYLENOL) tablet 650 mg (not administered)  ondansetron (ZOFRAN) injection 4 mg (not administered)  enoxaparin (LOVENOX) injection 40 mg (40 mg Subcutaneous Given 01/11/17 2249)  labetalol (NORMODYNE,TRANDATE) injection 5 mg (not administered)  aspirin chewable tablet 324 mg (324 mg Oral Given 01/11/17 1820)  prochlorperazine (COMPAZINE) injection 10  mg (10 mg Intravenous Given 01/11/17 1826)  diphenhydrAMINE (BENADRYL) injection 25 mg (25 mg Intravenous Given 01/11/17 1823)  iopamidol (ISOVUE-370) 76 % injection 100 mL (100 mLs Intravenous Contrast Given 01/11/17 1904)    Vitals:   01/11/17 2030 01/11/17 2045 01/11/17 2100 01/11/17 2150  BP: (!) 138/99   (!) 179/85  Pulse: 78   77  Resp: (!) 22 (!) 33 (!) 26 20  Temp:    98.1 F (36.7 C)  TempSrc:    Oral  SpO2: 97%   100%  Weight:    (!) 366 lb 2.9 oz (166.1 kg)  Height:     (1.626 m)    Final diagnoses:  Chest pain with moderate risk for cardiac etiology    Admission/ observation were discussed with the admitting physician, patient and/or family and they are comfortable with the plan.    Final Clinical Impressions(s) / ED Diagnoses   Final diagnoses:  Chest pain with moderate risk for cardiac etiology    New Prescriptions Current Discharge Medication List    I personally performed the services described in this documentation, which was scribed in my presence. The recorded information has been reviewed and is accurate.      Melene Plan, DO 01/11/17 2323

## 2017-01-11 NOTE — ED Notes (Signed)
ED Provider at bedside. 

## 2017-01-12 ENCOUNTER — Encounter (HOSPITAL_COMMUNITY)
Admission: EM | Disposition: A | Discharge status: Home / Self Care | Payer: Self-pay | Source: Home / Self Care | Attending: Emergency Medicine

## 2017-01-12 ENCOUNTER — Encounter (HOSPITAL_COMMUNITY): Payer: Self-pay | Admitting: Cardiology

## 2017-01-12 DIAGNOSIS — E7800 Pure hypercholesterolemia, unspecified: Secondary | ICD-10-CM | POA: Diagnosis not present

## 2017-01-12 DIAGNOSIS — R079 Chest pain, unspecified: Secondary | ICD-10-CM | POA: Diagnosis not present

## 2017-01-12 DIAGNOSIS — I10 Essential (primary) hypertension: Secondary | ICD-10-CM

## 2017-01-12 HISTORY — PX: LEFT HEART CATH AND CORONARY ANGIOGRAPHY: CATH118249

## 2017-01-12 LAB — URINALYSIS, ROUTINE W REFLEX MICROSCOPIC
BACTERIA UA: NONE SEEN
Bilirubin Urine: NEGATIVE
Glucose, UA: NEGATIVE mg/dL
Ketones, ur: NEGATIVE mg/dL
Leukocytes, UA: NEGATIVE
Nitrite: NEGATIVE
PROTEIN: NEGATIVE mg/dL
SPECIFIC GRAVITY, URINE: 1.031 — AB (ref 1.005–1.030)
pH: 6 (ref 5.0–8.0)

## 2017-01-12 LAB — PROTIME-INR
INR: 0.99
PROTHROMBIN TIME: 13.1 s (ref 11.4–15.2)

## 2017-01-12 LAB — PREGNANCY, URINE: PREG TEST UR: NEGATIVE

## 2017-01-12 LAB — TROPONIN I
Troponin I: <0.03 ng/mL (ref <0.03)
Troponin I: 0.03 ng/mL (ref <0.03)

## 2017-01-12 LAB — HIV ANTIBODY (ROUTINE TESTING W REFLEX): HIV SCREEN 4TH GENERATION: NONREACTIVE

## 2017-01-12 SURGERY — LEFT HEART CATH AND CORONARY ANGIOGRAPHY
Anesthesia: LOCAL

## 2017-01-12 MED ORDER — FENTANYL CITRATE (PF) 100 MCG/2ML IJ SOLN
INTRAMUSCULAR | Status: DC | PRN
  Administered 2017-01-12 (×2): 50 ug via INTRAVENOUS

## 2017-01-12 MED ORDER — CHLORTHALIDONE 25 MG PO TABS
25.0000 mg | ORAL_TABLET | Freq: Every day | ORAL | Status: DC
  Administered 2017-01-13: 11:00:00 25 mg via ORAL
  Filled 2017-01-12 (×2): qty 1

## 2017-01-12 MED ORDER — ASPIRIN 81 MG PO CHEW: 81.0000 mg | CHEWABLE_TABLET | ORAL | Status: DC

## 2017-01-12 MED ORDER — VERAPAMIL HCL 2.5 MG/ML IV SOLN
INTRAVENOUS | Status: AC
  Filled 2017-01-12: qty 2

## 2017-01-12 MED ORDER — ACETAMINOPHEN 325 MG PO TABS: 650.0000 mg | ORAL_TABLET | ORAL | Status: DC | PRN

## 2017-01-12 MED ORDER — LIDOCAINE HCL (PF) 1 % IJ SOLN
INTRAMUSCULAR | Status: DC | PRN
Start: 2017-01-12 — End: 2017-01-12
  Administered 2017-01-12: 5 mL via SUBCUTANEOUS
  Administered 2017-01-12: 6 mL via SUBCUTANEOUS

## 2017-01-12 MED ORDER — AMLODIPINE BESYLATE 5 MG PO TABS
5.0000 mg | ORAL_TABLET | Freq: Every day | ORAL | Status: DC
  Administered 2017-01-12 – 2017-01-13 (×2): 5 mg via ORAL
  Filled 2017-01-12 (×2): qty 1

## 2017-01-12 MED ORDER — ENOXAPARIN SODIUM 80 MG/0.8ML ~~LOC~~ SOLN
80.0000 mg | SUBCUTANEOUS | Status: DC
  Administered 2017-01-13: 80 mg via SUBCUTANEOUS
  Filled 2017-01-12: qty 0.8

## 2017-01-12 MED ORDER — ENOXAPARIN SODIUM 80 MG/0.8ML ~~LOC~~ SOLN
80.0000 mg | SUBCUTANEOUS | Status: DC
  Filled 2017-01-12: qty 0.8

## 2017-01-12 MED ORDER — VERAPAMIL HCL 2.5 MG/ML IV SOLN
INTRAVENOUS | Status: DC | PRN
  Administered 2017-01-12: 10 mL via INTRA_ARTERIAL

## 2017-01-12 MED ORDER — ATORVASTATIN CALCIUM 20 MG PO TABS
20.0000 mg | ORAL_TABLET | Freq: Every day | ORAL | Status: DC
  Administered 2017-01-12: 21:00:00 20 mg via ORAL
  Filled 2017-01-12: qty 1

## 2017-01-12 MED ORDER — FENTANYL CITRATE (PF) 100 MCG/2ML IJ SOLN
INTRAMUSCULAR | Status: AC
Start: 2017-01-12 — End: 2017-01-12
  Filled 2017-01-12: qty 2

## 2017-01-12 MED ORDER — SODIUM CHLORIDE 0.9% FLUSH: 3.0000 mL | INTRAVENOUS | Status: DC | PRN

## 2017-01-12 MED ORDER — SODIUM CHLORIDE 0.9% FLUSH
3.0000 mL | Freq: Two times a day (BID) | INTRAVENOUS | Status: DC
  Administered 2017-01-12 – 2017-01-13 (×2): 3 mL via INTRAVENOUS

## 2017-01-12 MED ORDER — HEPARIN SODIUM (PORCINE) 1000 UNIT/ML IJ SOLN
INTRAMUSCULAR | Status: AC
  Filled 2017-01-12: qty 1

## 2017-01-12 MED ORDER — LIDOCAINE HCL (PF) 1 % IJ SOLN
INTRAMUSCULAR | Status: AC
  Filled 2017-01-12: qty 30

## 2017-01-12 MED ORDER — ASPIRIN 81 MG PO CHEW
81.0000 mg | CHEWABLE_TABLET | ORAL | Status: AC
  Administered 2017-01-12: 81 mg via ORAL
  Filled 2017-01-12: qty 1

## 2017-01-12 MED ORDER — SODIUM CHLORIDE 0.9 % WEIGHT BASED INFUSION
3.0000 mL/kg/h | INTRAVENOUS | Status: DC
  Administered 2017-01-12: 3 mL/kg/h via INTRAVENOUS

## 2017-01-12 MED ORDER — HEPARIN (PORCINE) IN NACL 2-0.9 UNIT/ML-% IJ SOLN
INTRAMUSCULAR | Status: AC
  Filled 2017-01-12: qty 1000

## 2017-01-12 MED ORDER — SODIUM CHLORIDE 0.9% FLUSH
3.0000 mL | Freq: Two times a day (BID) | INTRAVENOUS | Status: DC
  Administered 2017-01-12: 3 mL via INTRAVENOUS

## 2017-01-12 MED ORDER — SODIUM CHLORIDE 0.9 % IV SOLN: 250.0000 mL | INTRAVENOUS | Status: DC | PRN

## 2017-01-12 MED ORDER — HEPARIN SODIUM (PORCINE) 1000 UNIT/ML IJ SOLN
INTRAMUSCULAR | Status: DC | PRN
  Administered 2017-01-12: 5000 [IU] via INTRAVENOUS

## 2017-01-12 MED ORDER — IOPAMIDOL (ISOVUE-370) INJECTION 76%
INTRAVENOUS | Status: DC | PRN
Start: 2017-01-12 — End: 2017-01-12
  Administered 2017-01-12: 90 mL via INTRA_ARTERIAL

## 2017-01-12 MED ORDER — MIDAZOLAM HCL 2 MG/2ML IJ SOLN
INTRAMUSCULAR | Status: DC | PRN
  Administered 2017-01-12 (×2): 1 mg via INTRAVENOUS

## 2017-01-12 MED ORDER — MIDAZOLAM HCL 2 MG/2ML IJ SOLN
INTRAMUSCULAR | Status: AC
  Filled 2017-01-12: qty 2

## 2017-01-12 MED ORDER — SODIUM CHLORIDE 0.9 % WEIGHT BASED INFUSION: 3.0000 mL/kg/h | INTRAVENOUS | Status: DC

## 2017-01-12 MED ORDER — GI COCKTAIL ~~LOC~~
30.0000 mL | Freq: Once | ORAL | Status: AC
  Administered 2017-01-12: 30 mL via ORAL
  Filled 2017-01-12: qty 30

## 2017-01-12 MED ORDER — ONDANSETRON HCL 4 MG/2ML IJ SOLN: 4.0000 mg | Freq: Four times a day (QID) | INTRAMUSCULAR | Status: DC | PRN

## 2017-01-12 MED ORDER — SODIUM CHLORIDE 0.9 % IV SOLN: INTRAVENOUS | Status: AC

## 2017-01-12 MED ORDER — SODIUM CHLORIDE 0.9 % WEIGHT BASED INFUSION
1.0000 mL/kg/h | INTRAVENOUS | Status: DC
Start: 2017-01-12 — End: 2017-01-12

## 2017-01-12 MED ORDER — HEPARIN (PORCINE) IN NACL 2-0.9 UNIT/ML-% IJ SOLN
INTRAMUSCULAR | Status: DC | PRN
  Administered 2017-01-12: 1000 mL

## 2017-01-12 MED ORDER — HEPARIN SODIUM (PORCINE) 5000 UNIT/ML IJ SOLN
5000.0000 [IU] | Freq: Three times a day (TID) | INTRAMUSCULAR | Status: DC
Start: 2017-01-12 — End: 2017-01-12

## 2017-01-12 MED ORDER — IOPAMIDOL (ISOVUE-370) INJECTION 76%
INTRAVENOUS | Status: AC
  Filled 2017-01-12: qty 100

## 2017-01-12 MED ORDER — SODIUM CHLORIDE 0.9 % WEIGHT BASED INFUSION: 1.0000 mL/kg/h | INTRAVENOUS | Status: DC

## 2017-01-12 MED ORDER — CARVEDILOL 3.125 MG PO TABS: 3.1250 mg | ORAL_TABLET | Freq: Two times a day (BID) | ORAL | Status: DC

## 2017-01-12 SURGICAL SUPPLY — 11 items
CATH INFINITI 5 FR JL3.5 (CATHETERS) ×2 IMPLANT
CATH INFINITI JR4 5F (CATHETERS) ×2 IMPLANT
DEVICE RAD COMP TR BAND LRG (VASCULAR PRODUCTS) ×4 IMPLANT
GLIDESHEATH SLEND A-KIT 6F 22G (SHEATH) ×2 IMPLANT
GUIDEWIRE INQWIRE 1.5J.035X260 (WIRE) ×1 IMPLANT
INQWIRE 1.5J .035X260CM (WIRE) ×2
KIT HEART LEFT (KITS) ×2 IMPLANT
PACK CARDIAC CATHETERIZATION (CUSTOM PROCEDURE TRAY) ×2 IMPLANT
TRANSDUCER W/STOPCOCK (MISCELLANEOUS) ×2 IMPLANT
TUBING CIL FLEX 10 FLL-RA (TUBING) ×2 IMPLANT
WIRE HI TORQ VERSACORE-J 145CM (WIRE) ×2 IMPLANT

## 2017-01-12 NOTE — Progress Notes (Signed)
CRITICAL VALUE ALERT  Critical value received:  Troponin 0.03  Date of notification:  01/12/17  Time of notification:  .0517  Critical value read back:Yes.    Nurse who received alert:  Donia Ast  MD notified (1st page):  Schorr  Time of first page:  [phone]  MD notified (2nd page):  Time of second page:  Responding MD:    Time MD responded:

## 2017-01-12 NOTE — Interval H&P Note (Signed)
Cath Lab Visit (complete for each Cath Lab visit)  Clinical Evaluation Leading to the Procedure:   ACS: Yes.    Non-ACS:    Anginal Classification: CCS IV  Anti-ischemic medical therapy: No Therapy  Non-Invasive Test Results: No non-invasive testing performed  Prior CABG: No previous CABG      History and Physical Interval Note:  01/12/2017 6:30 PM  Terri Boyd  has presented today for surgery, with the diagnosis of cp  The various methods of treatment have been discussed with the patient and family. After consideration of risks, benefits and other options for treatment, the patient has consented to  Procedure(s): Left Heart Cath and Coronary Angiography (N/A) as a surgical intervention .  The patient's history has been reviewed, patient examined, no change in status, stable for surgery.  I have reviewed the patient's chart and labs.  Questions were answered to the patient's satisfaction.     Lyn Records III

## 2017-01-12 NOTE — Progress Notes (Signed)
Radstat band on left wrist post procedure. No hematoma or ecchymosis present. Band loosened every 15 minutes. Hemostatis present at 21:30. Tegaderm dressing applied. Post hemostasis instructions given. SPO2 as measured in left thumb 95%.

## 2017-01-12 NOTE — H&P (View-Only) (Signed)
Patient ID: REGIS WILAND MRN: [id number], DOB/AGE: 06/04/1971   Admit date: 01/11/2017  Reason for Consult: Chest Pain  Requesting Physician: Dr. Rhona Leavens, Internal Medicine    Primary Physician: [name], MD Primary Cardiologist: New (Dr. Mayford Knife)   Pt. Profile:  Terri Boyd is a 46 y.o. female with h/o morbid obesity Body mass index is 62.86 kg/m., HTN, HLD, migraine headaches, h/o TIA and DVT, who is being seen today for the evaluation of chest pain at the request of Dr. Rhona Leavens, Internal Medicine.   Problem List  Past Medical History:  Diagnosis Date  . Asthma   . DVT (deep venous thrombosis) (HCC)   . High cholesterol   . Hypertension   . Migraine   . Obesity   . Stroke (HCC)   . TIA (transient ischemic attack)     Past Surgical History:  Procedure Laterality Date  . TUBAL LIGATION       Allergies  No Known Allergies  HPI  Terri Boyd is a 46 y.o. female with h/o morbid obesity Body mass index is 62.86 kg/m., HTN, HLD, migraine headaches, h/o TIA and DVT, who is being seen today for the evaluation of chest pain at the request of Dr. Rhona Leavens, Internal Medicine.   She presented to the [facility] ED overnight with multiple complaints of headaches x 1 week (different from usual migraine pain), intermittent CP x 3 days and nausea w/o vomiting. Her CP is described as a discomfort that feels like pressure in the center/left side of her chest. Does not radiate. It first started after she ate a meal too fast, so she initially thought it was reflux, however she has had recurrent intermittent discomfort since that time. She did not notice any change last night after eating a sandwich. It is somewhat worse with walking around and was also improved with SL NTG in the ED.  1st and 2nd troponin <0.03. 3rd Troponin 0.03. EKG shows NSR. No ischemic abnormalities. D-dimer was abnormal. Chest CT negative for PE.  Great vessels are normal in course and caliber. Top-normal heart  size. No significant pericardial fluid/thickening. No coronary calcifications noted. BNP normal.   She reports that her father had a MI in his 45s and got stents. No other significant family history.   Home Medications  Prior to Admission medications   Medication Sig Start Date End Date Taking? Authorizing Provider  aspirin-acetaminophen-caffeine (EXCEDRIN MIGRAINE) [phone] MG per tablet Take 2 tablets by mouth every 6 (six) hours as needed for headache.   Yes Historical Provider, MD  Multiple Vitamin (MULTIVITAMIN) capsule Take 1 capsule by mouth daily.   Yes Historical Provider, MD    Hospital Medications  . enoxaparin (LOVENOX) injection  40 mg Subcutaneous Q24H  . gi cocktail  30 mL Oral Once    acetaminophen, labetalol, nitroGLYCERIN, ondansetron (ZOFRAN) IV  Family History  Family History  Problem Relation Age of Onset  . CAD Father 78    MI/stents     Social History  Social History   Social History  . Marital status: Single    Spouse name: N/A  . Number of children: N/A  . Years of education: N/A   Occupational History  . Not on file.   Social History Main Topics  . Smoking status: Former Games developer  . Smokeless tobacco: Never Used  . Alcohol use Yes     Comment: 2 glasses wine/weekly  . Drug use: No  . Sexual activity: Yes  Birth control/ protection: None, Surgical   Other Topics Concern  . Not on file   Social History Narrative  . No narrative on file     Review of Systems General:  No chills, fever, night sweats or weight changes.  Cardiovascular:  No chest pain, dyspnea on exertion, edema, orthopnea, palpitations, paroxysmal nocturnal dyspnea. Dermatological: No rash, lesions/masses Respiratory: No cough, dyspnea Urologic: No hematuria, dysuria Abdominal:   No nausea, vomiting, diarrhea, bright red blood per rectum, melena, or hematemesis Neurologic:  No visual changes, wkns, changes in mental status. All other systems reviewed and are  otherwise negative except as noted above.  Physical Exam  Blood pressure (!) 145/70, pulse 71, temperature 98 F (36.7 C), temperature source Oral, resp. rate 20, height  (1.626 m), weight (!) 366 lb 2.9 oz (166.1 kg), last menstrual period 01/04/2017, SpO2 100 %.  General: Pleasant, NAD, morbidly obese  Psych: Normal affect. Neuro: Alert and oriented X 3. Moves all extremities spontaneously. HEENT: Normal  Neck: Supple without bruits or JVD. Lungs:  Resp regular and unlabored, CTA. Heart: RRR no s3, s4, or murmurs. Abdomen: Soft, non-tender, non-distended, BS + x 4.  Extremities: No clubbing, cyanosis or edema. DP/PT/Radials 2+ and equal bilaterally.  Labs  Troponin (Point of Care Test) No results for input(s): TROPIPOC in the last 72 hours.  Recent Labs  01/11/17 1758 01/11/17 2254 01/12/17 0101 01/12/17 0429  TROPONINI 0.03* <0.03 <0.03 0.03*   Lab Results  Component Value Date   WBC 7.3 01/11/2017   HGB 11.7 (L) 01/11/2017   HCT 36.2 01/11/2017   MCV 78.2 01/11/2017   PLT 360 01/11/2017     Recent Labs Lab 01/11/17 1758  NA 136  K 3.5  CL 103  CO2 23  BUN 12  CREATININE 0.98  CALCIUM 9.0  GLUCOSE 109*   Lab Results  Component Value Date   CHOL 247 (H) 02/17/2013   HDL 63 02/17/2013   LDLCALC 159 (H) 02/17/2013   TRIG 127 02/17/2013   Lab Results  Component Value Date   DDIMER 0.63 (H) 01/11/2017     Radiology/Studies  Ct Angio Chest Pe W And/or Wo Contrast  Result Date: 01/11/2017 CLINICAL DATA:  Elevated D-dimer. Chest pain. History of deep venous thrombosis. EXAM: CT ANGIOGRAPHY CHEST WITH CONTRAST TECHNIQUE: Multidetector CT imaging of the chest was performed using the standard protocol during bolus administration of intravenous contrast. Multiplanar CT image reconstructions and MIPs were obtained to evaluate the vascular anatomy. CONTRAST:  100 cc Isovue 370 IV. COMPARISON:  12/17/2015 chest radiograph. 06/08/2012 chest CT angiogram.  FINDINGS: Cardiovascular: The study is moderate quality for the evaluation of pulmonary embolism, with evaluation of the segmental and subsegmental pulmonary arteries limited by motion artifact and slightly limited contrast opacification. There are no filling defects in the central, lobar, segmental or subsegmental pulmonary artery branches to suggest acute pulmonary embolism. Great vessels are normal in course and caliber. Top-normal heart size. No significant pericardial fluid/thickening. Mediastinum/Nodes: No discrete thyroid nodules. Unremarkable esophagus. No pathologically enlarged axillary, mediastinal or hilar lymph nodes. Lungs/Pleura: No pneumothorax. No pleural effusion. Peripheral left lower lobe 4 mm solid pulmonary nodule (series 8/ image 51) is stable since 06/08/2012 and considered benign. Curvilinear parenchymal band in the dependent right lower lobe is compatible with postinfectious/postinflammatory scarring, unchanged. No acute consolidative airspace disease, lung masses or new significant pulmonary nodules. Upper abdomen: Unremarkable. Musculoskeletal: No aggressive appearing focal osseous lesions. Mild thoracic spondylosis. Review of the MIP images confirms the above  findings. IMPRESSION: No evidence of pulmonary embolism.  No acute disease in the chest. Electronically Signed   By: Delbert Phenix M.D.   On: 01/11/2017 19:31    ECG  NSR. No ischemic abnormalities -- personally reviewed  Telemetry  Not currently on telemetry -- personally reviewed    ASSESSMENT AND PLAN  Principal Problem:   Chest pain with moderate risk for cardiac etiology  1. Chest Pain with Moderate Risk for Cardiac Etiology: multiple risk factors including family history (father with MI/stents in his 46s), h/o untreated HLD, HTN and morbid obesity with substernal/left sided chest pressure, somewhat worse with exertion and has been improved with SL NTG. EKG w/o ischemic abnormalities. Troponin trend <0.03,  <0.03, 0.03. Chest CT negative for PE and no cardiac calcifications seen. Noninvasive ischemic testing may be of poor quality given her obesity/ body habitus. Will discuss case with Dr.Turner. Further recs pending. We will also see how pt responds to GI cocktail which has just been ordered by primary team    Signed, Robbie Lis, PA-C, [facility] 01/12/2017, 11:11 AM [facility] Pager: [phone]

## 2017-01-12 NOTE — Progress Notes (Signed)
Pt in cath holding via [facility]. Awake, alert oriented and awaiting cardiac cath.

## 2017-01-12 NOTE — Progress Notes (Signed)
PROGRESS NOTE    Terri Boyd  ZOX:[id number] DOB: 1971/02/23 DOA: 01/11/2017 PCP: [name], MD    Brief Narrative:  46 y.o. female with medical history significant of migraines, HTN, HLD, TIA and DVT.  Patient presents to the ED with c/o headaches x 1 week, nausea x yesterday that has resolved.  Intermittent chest pain x 2 days: "pressure" like sensation, nothing seems to make better or worse although it improved with NTG in the ambulance.  Symptoms were intermittent but became constant 6 hours ago and were relieved with NTG SL.  No cough, congestion, fevers, or other symptoms.  No long distance travel, no h/o DM.  Assessment & Plan:   Principal Problem:   Chest pain with moderate risk for cardiac etiology Active Problems:   Morbid obesity (HCC)   Essential hypertension   Pure hypercholesterolemia   1. Chest pain - HEART score of 4 1. Serial trops thus far neg 2. Tele monitor 3. Risk factors include morbid obesity, HTN, HLD, family history (father with stent placed in his 55's) 4. Consulted Cardiology. Recommendations for heart cath 2. HTN - 1. PRN labetalol ordered 2. Stable at present 3. Prior vitals reviewed. Patient consistently with bp in the stage 1 hypertensive range. Consider bp regimen on discharge 3. HLD 1. Prior labs reviewed 2. LDL just under 160 3. Will start lipitor  DVT prophylaxis: Lovenox subQ Code Status: Full Family Communication: Pt in room, family not at bedside Disposition Plan: Uncertain at this time  Consultants:   Cardiology  Procedures:     Antimicrobials: Anti-infectives    None       Subjective: Reports substernal chest "pressure" this AM.  Objective: Vitals:   01/11/17 2045 01/11/17 2100 01/11/17 2150 01/12/17 0534  BP:   (!) 179/85 (!) 145/70  Pulse:   77 71  Resp: (!) 33 (!) Temp:   98.1 F (36.7 C) 98 F (36.7 C)  TempSrc:   Oral Oral  SpO2:   100% 100%  Weight:   (!) 166.1 kg (366 lb 2.9 oz)     Height:    (1.626 m)     Intake/Output Summary (Last 24 hours) at 01/12/17 1458 Last data filed at 01/12/17 1110  Gross per 24 hour  Intake                0 ml  Output             1600 ml  Net            -1600 ml   Filed Weights   01/11/17 2150  Weight: (!) 166.1 kg (366 lb 2.9 oz)    Examination:  General exam: Appears calm and comfortable  Respiratory system: Clear to auscultation. Respiratory effort normal. Cardiovascular system: S1 & S2 heard, RRR.  Gastrointestinal system: Abdomen is nondistended, soft and nontender. No organomegaly or masses felt. Normal bowel sounds heard. Central nervous system: Alert and oriented. No focal neurological deficits. Extremities: Symmetric 5 x 5 power. Skin: No rashes, lesions  Psychiatry: Judgement and insight appear normal. Mood & affect appropriate.   Data Reviewed: I have personally reviewed following labs and imaging studies  CBC:  Recent Labs Lab 01/11/17 1758  WBC 7.3  NEUTROABS 2.8  HGB 11.7*  HCT 36.2  MCV 78.2  PLT 360   Basic Metabolic Panel:  Recent Labs Lab 01/11/17 1758  NA 136  K 3.5  CL 103  CO2 23  GLUCOSE 109*  BUN 12  CREATININE 0.98  CALCIUM 9.0   GFR: Estimated Creatinine Clearance: 114.8 mL/min (by C-G formula based on SCr of 0.98 mg/dL). Liver Function Tests: No results for input(s): AST, ALT, ALKPHOS, BILITOT, PROT, ALBUMIN in the last 168 hours. No results for input(s): LIPASE, AMYLASE in the last 168 hours. No results for input(s): AMMONIA in the last 168 hours. Coagulation Profile: No results for input(s): INR, PROTIME in the last 168 hours. Cardiac Enzymes:  Recent Labs Lab 01/11/17 1758 01/11/17 2254 01/12/17 0101 01/12/17 0429  TROPONINI 0.03* <0.03 <0.03 0.03*   BNP (last 3 results) No results for input(s): PROBNP in the last 8760 hours. HbA1C: No results for input(s): HGBA1C in the last 72 hours. CBG: No results for input(s): GLUCAP in the last 168 hours. Lipid  Profile: No results for input(s): CHOL, HDL, LDLCALC, TRIG, CHOLHDL, LDLDIRECT in the last 72 hours. Thyroid Function Tests: No results for input(s): TSH, T4TOTAL, FREET4, T3FREE, THYROIDAB in the last 72 hours. Anemia Panel: No results for input(s): VITAMINB12, FOLATE, FERRITIN, TIBC, IRON, RETICCTPCT in the last 72 hours. Sepsis Labs: No results for input(s): PROCALCITON, LATICACIDVEN in the last 168 hours.  No results found for this or any previous visit (from the past 240 hour(s)).   Radiology Studies: Ct Angio Chest Pe W And/or Wo Contrast  Result Date: 01/11/2017 CLINICAL DATA:  Elevated D-dimer. Chest pain. History of deep venous thrombosis. EXAM: CT ANGIOGRAPHY CHEST WITH CONTRAST TECHNIQUE: Multidetector CT imaging of the chest was performed using the standard protocol during bolus administration of intravenous contrast. Multiplanar CT image reconstructions and MIPs were obtained to evaluate the vascular anatomy. CONTRAST:  100 cc Isovue 370 IV. COMPARISON:  12/17/2015 chest radiograph. 06/08/2012 chest CT angiogram. FINDINGS: Cardiovascular: The study is moderate quality for the evaluation of pulmonary embolism, with evaluation of the segmental and subsegmental pulmonary arteries limited by motion artifact and slightly limited contrast opacification. There are no filling defects in the central, lobar, segmental or subsegmental pulmonary artery branches to suggest acute pulmonary embolism. Great vessels are normal in course and caliber. Top-normal heart size. No significant pericardial fluid/thickening. Mediastinum/Nodes: No discrete thyroid nodules. Unremarkable esophagus. No pathologically enlarged axillary, mediastinal or hilar lymph nodes. Lungs/Pleura: No pneumothorax. No pleural effusion. Peripheral left lower lobe 4 mm solid pulmonary nodule (series 8/ image 51) is stable since 06/08/2012 and considered benign. Curvilinear parenchymal band in the dependent right lower lobe is compatible  with postinfectious/postinflammatory scarring, unchanged. No acute consolidative airspace disease, lung masses or new significant pulmonary nodules. Upper abdomen: Unremarkable. Musculoskeletal: No aggressive appearing focal osseous lesions. Mild thoracic spondylosis. Review of the MIP images confirms the above findings. IMPRESSION: No evidence of pulmonary embolism.  No acute disease in the chest. Electronically Signed   By: Delbert Phenix M.D.   On: 01/11/2017 19:31    Scheduled Meds: . amLODipine  5 mg Oral Daily  . [START ON 01/13/2017] aspirin  81 mg Oral Pre-Cath  . atorvastatin  20 mg Oral q1800  . [START ON 01/13/2017] chlorthalidone  25 mg Oral Daily  . enoxaparin (LOVENOX) injection  80 mg Subcutaneous Q24H  . sodium chloride flush  3 mL Intravenous Q12H   Continuous Infusions: . sodium chloride    . [START ON 01/13/2017] sodium chloride     Followed by  . [START ON 01/13/2017] sodium chloride       LOS: 0 days   CHIU, Scheryl Marten, MD [facility] Pager [phone]  If 7PM-7AM, please contact night-coverage [email or web address] Password  [email or web address] 01/12/2017, 2:58 PM

## 2017-01-12 NOTE — Consult Note (Signed)
   Patient ID: Terri Boyd MRN: [id number], DOB/AGE: 04/22/1971   Admit date: 01/11/2017  Reason for Consult: Chest Pain  Requesting Physician: Dr. Chiu, Internal Medicine    Primary Physician: RICE,KATHLEEN M, MD Primary Cardiologist: New (Dr. Turner)   Pt. Profile:  Terri Boyd is a 46 y.o. female with h/o morbid obesity Body mass index is 62.86 kg/m., HTN, HLD, migraine headaches, h/o TIA and DVT, who is being seen today for the evaluation of chest pain at the request of Dr. Chiu, Internal Medicine.   Problem List  Past Medical History:  Diagnosis Date  . Asthma   . DVT (deep venous thrombosis) (HCC)   . High cholesterol   . Hypertension   . Migraine   . Obesity   . Stroke (HCC)   . TIA (transient ischemic attack)     Past Surgical History:  Procedure Laterality Date  . TUBAL LIGATION       Allergies  No Known Allergies  HPI  Terri Boyd is a 46 y.o. female with h/o morbid obesity Body mass index is 62.86 kg/m., HTN, HLD, migraine headaches, h/o TIA and DVT, who is being seen today for the evaluation of chest pain at the request of Dr. Chiu, Internal Medicine.   She presented to the [facility] ED overnight with multiple complaints of headaches x 1 week (different from usual migraine pain), intermittent CP x 3 days and nausea w/o vomiting. Her CP is described as a discomfort that feels like pressure in the center/left side of her chest. Does not radiate. It first started after she ate a meal too fast, so she initially thought it was reflux, however she has had recurrent intermittent discomfort since that time. She did not notice any change last night after eating a sandwich. It is somewhat worse with walking around and was also improved with SL NTG in the ED.  1st and 2nd troponin <0.03. 3rd Troponin 0.03. EKG shows NSR. No ischemic abnormalities. D-dimer was abnormal. Chest CT negative for PE.  Great vessels are normal in course and caliber. Top-normal heart  size. No significant pericardial fluid/thickening. No coronary calcifications noted. BNP normal.   She reports that her father had a MI in his 50s and got stents. No other significant family history.   Home Medications  Prior to Admission medications   Medication Sig Start Date End Date Taking? Authorizing Provider  aspirin-acetaminophen-caffeine (EXCEDRIN MIGRAINE) 250-250-65 MG per tablet Take 2 tablets by mouth every 6 (six) hours as needed for headache.   Yes Historical Provider, MD  Multiple Vitamin (MULTIVITAMIN) capsule Take 1 capsule by mouth daily.   Yes Historical Provider, MD    Hospital Medications  . enoxaparin (LOVENOX) injection  40 mg Subcutaneous Q24H  . gi cocktail  30 mL Oral Once    acetaminophen, labetalol, nitroGLYCERIN, ondansetron (ZOFRAN) IV  Family History  Family History  Problem Relation Age of Onset  . CAD Father 50    MI/stents     Social History  Social History   Social History  . Marital status: Single    Spouse name: N/A  . Number of children: N/A  . Years of education: N/A   Occupational History  . Not on file.   Social History Main Topics  . Smoking status: Former Smoker  . Smokeless tobacco: Never Used  . Alcohol use Yes     Comment: 2 glasses wine/weekly  . Drug use: No  . Sexual activity: Yes      Birth control/ protection: None, Surgical   Other Topics Concern  . Not on file   Social History Narrative  . No narrative on file     Review of Systems General:  No chills, fever, night sweats or weight changes.  Cardiovascular:  No chest pain, dyspnea on exertion, edema, orthopnea, palpitations, paroxysmal nocturnal dyspnea. Dermatological: No rash, lesions/masses Respiratory: No cough, dyspnea Urologic: No hematuria, dysuria Abdominal:   No nausea, vomiting, diarrhea, bright red blood per rectum, melena, or hematemesis Neurologic:  No visual changes, wkns, changes in mental status. All other systems reviewed and are  otherwise negative except as noted above.  Physical Exam  Blood pressure (!) 145/70, pulse 71, temperature 98 F (36.7 C), temperature source Oral, resp. rate 20, height 5' 4" (1.626 m), weight (!) 366 lb 2.9 oz (166.1 kg), last menstrual period 01/04/2017, SpO2 100 %.  General: Pleasant, NAD, morbidly obese  Psych: Normal affect. Neuro: Alert and oriented X 3. Moves all extremities spontaneously. HEENT: Normal  Neck: Supple without bruits or JVD. Lungs:  Resp regular and unlabored, CTA. Heart: RRR no s3, s4, or murmurs. Abdomen: Soft, non-tender, non-distended, BS + x 4.  Extremities: No clubbing, cyanosis or edema. DP/PT/Radials 2+ and equal bilaterally.  Labs  Troponin (Point of Care Test) No results for input(s): TROPIPOC in the last 72 hours.  Recent Labs  01/11/17 1758 01/11/17 2254 01/12/17 0101 01/12/17 0429  TROPONINI 0.03* <0.03 <0.03 0.03*   Lab Results  Component Value Date   WBC 7.3 01/11/2017   HGB 11.7 (L) 01/11/2017   HCT 36.2 01/11/2017   MCV 78.2 01/11/2017   PLT 360 01/11/2017     Recent Labs Lab 01/11/17 1758  NA 136  K 3.5  CL 103  CO2 23  BUN 12  CREATININE 0.98  CALCIUM 9.0  GLUCOSE 109*   Lab Results  Component Value Date   CHOL 247 (H) 02/17/2013   HDL 63 02/17/2013   LDLCALC 159 (H) 02/17/2013   TRIG 127 02/17/2013   Lab Results  Component Value Date   DDIMER 0.63 (H) 01/11/2017     Radiology/Studies  Ct Angio Chest Pe W And/or Wo Contrast  Result Date: 01/11/2017 CLINICAL DATA:  Elevated D-dimer. Chest pain. History of deep venous thrombosis. EXAM: CT ANGIOGRAPHY CHEST WITH CONTRAST TECHNIQUE: Multidetector CT imaging of the chest was performed using the standard protocol during bolus administration of intravenous contrast. Multiplanar CT image reconstructions and MIPs were obtained to evaluate the vascular anatomy. CONTRAST:  100 cc Isovue 370 IV. COMPARISON:  12/17/2015 chest radiograph. 06/08/2012 chest CT angiogram.  FINDINGS: Cardiovascular: The study is moderate quality for the evaluation of pulmonary embolism, with evaluation of the segmental and subsegmental pulmonary arteries limited by motion artifact and slightly limited contrast opacification. There are no filling defects in the central, lobar, segmental or subsegmental pulmonary artery branches to suggest acute pulmonary embolism. Great vessels are normal in course and caliber. Top-normal heart size. No significant pericardial fluid/thickening. Mediastinum/Nodes: No discrete thyroid nodules. Unremarkable esophagus. No pathologically enlarged axillary, mediastinal or hilar lymph nodes. Lungs/Pleura: No pneumothorax. No pleural effusion. Peripheral left lower lobe 4 mm solid pulmonary nodule (series 8/ image 51) is stable since 06/08/2012 and considered benign. Curvilinear parenchymal band in the dependent right lower lobe is compatible with postinfectious/postinflammatory scarring, unchanged. No acute consolidative airspace disease, lung masses or new significant pulmonary nodules. Upper abdomen: Unremarkable. Musculoskeletal: No aggressive appearing focal osseous lesions. Mild thoracic spondylosis. Review of the MIP images confirms the above   findings. IMPRESSION: No evidence of pulmonary embolism.  No acute disease in the chest. Electronically Signed   By: Jason A Poff M.D.   On: 01/11/2017 19:31    ECG  NSR. No ischemic abnormalities -- personally reviewed  Telemetry  Not currently on telemetry -- personally reviewed    ASSESSMENT AND PLAN  Principal Problem:   Chest pain with moderate risk for cardiac etiology  1. Chest Pain with Moderate Risk for Cardiac Etiology: multiple risk factors including family history (father with MI/stents in his 50s), h/o untreated HLD, HTN and morbid obesity with substernal/left sided chest pressure, somewhat worse with exertion and has been improved with SL NTG. EKG w/o ischemic abnormalities. Troponin trend <0.03,  <0.03, 0.03. Chest CT negative for PE and no cardiac calcifications seen. Noninvasive ischemic testing may be of poor quality given her obesity/ body habitus. Will discuss case with Dr.Turner. Further recs pending. We will also see how pt responds to GI cocktail which has just been ordered by primary team    Signed, Tylor Courtwright, PA-C, MHS 01/12/2017, 11:11 AM [facility] Pager: [phone]  

## 2017-01-13 ENCOUNTER — Encounter (HOSPITAL_COMMUNITY): Payer: Self-pay | Admitting: Interventional Cardiology

## 2017-01-13 ENCOUNTER — Observation Stay (HOSPITAL_BASED_OUTPATIENT_CLINIC_OR_DEPARTMENT_OTHER): Payer: Medicaid Other

## 2017-01-13 DIAGNOSIS — R079 Chest pain, unspecified: Secondary | ICD-10-CM

## 2017-01-13 DIAGNOSIS — I25811 Atherosclerosis of native coronary artery of transplanted heart without angina pectoris: Secondary | ICD-10-CM

## 2017-01-13 DIAGNOSIS — R072 Precordial pain: Secondary | ICD-10-CM

## 2017-01-13 DIAGNOSIS — I10 Essential (primary) hypertension: Secondary | ICD-10-CM

## 2017-01-13 DIAGNOSIS — E7800 Pure hypercholesterolemia, unspecified: Secondary | ICD-10-CM

## 2017-01-13 LAB — BASIC METABOLIC PANEL
ANION GAP: 9 (ref 5–15)
BUN: 8 mg/dL (ref 6–20)
CALCIUM: 8.9 mg/dL (ref 8.9–10.3)
CHLORIDE: 104 mmol/L (ref 101–111)
CO2: 23 mmol/L (ref 22–32)
Creatinine, Ser: 0.89 mg/dL (ref 0.44–1.00)
GFR calc Af Amer: >60 mL/min (ref >60)
GFR calc non Af Amer: >60 mL/min (ref >60)
GLUCOSE: 109 mg/dL — AB (ref 65–99)
POTASSIUM: 3.7 mmol/L (ref 3.5–5.1)
Sodium: 136 mmol/L (ref 135–145)

## 2017-01-13 LAB — LIPID PANEL
Cholesterol: 267 mg/dL — ABNORMAL HIGH (ref 0–200)
HDL: 75 mg/dL (ref >40)
LDL CALC: 174 mg/dL — AB (ref 0–99)
Total CHOL/HDL Ratio: 3.6 RATIO
Triglycerides: 90 mg/dL (ref <150)
VLDL: 18 mg/dL (ref 0–40)

## 2017-01-13 LAB — CBC WITH DIFFERENTIAL/PLATELET
Basophils Absolute: 0 10*3/uL (ref 0.0–0.1)
Basophils Relative: 0 %
EOS ABS: 0.1 10*3/uL (ref 0.0–0.7)
EOS PCT: 1 %
HCT: 35.7 % — ABNORMAL LOW (ref 36.0–46.0)
Hemoglobin: 11.5 g/dL — ABNORMAL LOW (ref 12.0–15.0)
LYMPHS ABS: 2.5 10*3/uL (ref 0.7–4.0)
LYMPHS PCT: 39 %
MCH: 25.2 pg — AB (ref 26.0–34.0)
MCHC: 32.2 g/dL (ref 30.0–36.0)
MCV: 78.3 fL (ref 78.0–100.0)
MONO ABS: 0.7 10*3/uL (ref 0.1–1.0)
MONOS PCT: 11 %
Neutro Abs: 3.1 10*3/uL (ref 1.7–7.7)
Neutrophils Relative %: 49 %
PLATELETS: 353 10*3/uL (ref 150–400)
RBC: 4.56 MIL/uL (ref 3.87–5.11)
RDW: 16.3 % — AB (ref 11.5–15.5)
WBC: 6.3 10*3/uL (ref 4.0–10.5)

## 2017-01-13 LAB — ECHOCARDIOGRAM COMPLETE
HEIGHTINCHES: 65 in
WEIGHTICAEL: 5858.94 [oz_av]

## 2017-01-13 LAB — MAGNESIUM: MAGNESIUM: 2.2 mg/dL (ref 1.7–2.4)

## 2017-01-13 MED ORDER — ATORVASTATIN CALCIUM 40 MG PO TABS
40.0000 mg | ORAL_TABLET | Freq: Every day | ORAL | Status: DC
  Administered 2017-01-13: 40 mg via ORAL
  Filled 2017-01-13: qty 1

## 2017-01-13 MED ORDER — PANTOPRAZOLE SODIUM 40 MG PO TBEC
40.0000 mg | DELAYED_RELEASE_TABLET | Freq: Every day | ORAL | Status: DC
  Administered 2017-01-13: 09:00:00 40 mg via ORAL
  Filled 2017-01-13: qty 1

## 2017-01-13 MED ORDER — ATORVASTATIN CALCIUM 40 MG PO TABS: 40.0000 mg | ORAL_TABLET | Freq: Every day | ORAL | 1 refills | Status: DC

## 2017-01-13 MED ORDER — CHLORTHALIDONE 25 MG PO TABS: 25.0000 mg | ORAL_TABLET | Freq: Every day | ORAL | 1 refills | Status: DC

## 2017-01-13 MED ORDER — PANTOPRAZOLE SODIUM 40 MG PO TBEC: 40.0000 mg | DELAYED_RELEASE_TABLET | Freq: Every day | ORAL | 1 refills | Status: DC

## 2017-01-13 MED ORDER — GI COCKTAIL ~~LOC~~: 30.0000 mL | Freq: Three times a day (TID) | ORAL | Status: DC | PRN

## 2017-01-13 MED ORDER — NITROGLYCERIN 0.4 MG SL SUBL: 0.4000 mg | SUBLINGUAL_TABLET | SUBLINGUAL | 0 refills | Status: DC | PRN

## 2017-01-13 MED ORDER — AMLODIPINE BESYLATE 5 MG PO TABS: 5.0000 mg | ORAL_TABLET | Freq: Every day | ORAL | 1 refills | Status: DC

## 2017-01-13 NOTE — Care Management Note (Signed)
Case Management Note  Patient Details  Name: Terri Boyd MRN: [id number] Date of Birth: 05/15/1971  Subjective/Objective:   From home with children , s/p left heart cath.  She has medication coverage, has PCP, Dr. Dimple Casey,  and transport at dc.                  Action/Plan: PTA indep, NCM will follow for dc needs  Expected Discharge Date:                  Expected Discharge Plan:  Home/Self Care  In-House Referral:     Discharge planning Services  CM Consult  Post Acute Care Choice:    Choice offered to:     DME Arranged:    DME Agency:     HH Arranged:    HH Agency:     Status of Service:  Completed, signed off  If discussed at [employer] of Stay Meetings, dates discussed:    Additional Comments:  Leone Haven, RN 01/13/2017, 9:08 AM

## 2017-01-13 NOTE — Progress Notes (Signed)
  Echocardiogram 2D Echocardiogram has been performed.  Roniel Halloran L Androw 01/13/2017, 2:51 PM

## 2017-01-13 NOTE — Progress Notes (Signed)
Progress Note  Patient Name: Terri Boyd Date of Encounter: 01/13/2017  Primary Cardiologist: Dr. Mayford Knife   Subjective   Feels better today. Current CP free. Cath sites are stable. No pain.   Inpatient Medications    Scheduled Meds: . amLODipine  5 mg Oral Daily  . atorvastatin  20 mg Oral q1800  . chlorthalidone  25 mg Oral Daily  . enoxaparin (LOVENOX) injection  80 mg Subcutaneous Q24H  . sodium chloride flush  3 mL Intravenous Q12H   Continuous Infusions: . sodium chloride     PRN Meds: sodium chloride, acetaminophen, labetalol, nitroGLYCERIN, ondansetron (ZOFRAN) IV, sodium chloride flush   Vital Signs    Vitals:   01/12/17 2245 01/12/17 2300 01/13/17 0140 01/13/17 0525  BP:  (!) 158/86 (!) 160/66 (!) 163/76  Pulse: 78 77 74 75  Resp: Temp:    98.1 F (36.7 C)  TempSrc:    Oral  SpO2: 99% 99% 100% 99%  Weight:   (!) 366 lb 2.9 oz (166.1 kg)   Height:        Intake/Output Summary (Last 24 hours) at 01/13/17 0811 Last data filed at 01/13/17 0546  Gross per 24 hour  Intake          1204.84 ml  Output             1800 ml  Net          -595.16 ml   Filed Weights   01/12/17 1452 01/12/17 2020 01/13/17 0140  Weight: (!) 366 lb 2.9 oz (166.1 kg) (!) 366 lb 2.9 oz (166.1 kg) (!) 366 lb 2.9 oz (166.1 kg)    Telemetry    NSR - Personally Reviewed  ECG    NSR - Personally Reviewed  Physical Exam   GEN: No acute distress.  Morbidly obese  Neck: No JVD Cardiac: RRR, no murmurs, rubs, or gallops.  Respiratory: Clear to auscultation bilaterally. GI: Soft, nontender, non-distended  MS: No edema; No deformity. Neuro:  Nonfocal  Psych: Normal affect   Labs    Chemistry Recent Labs Lab 01/11/17 1758  NA 136  K 3.5  CL 103  CO2 23  GLUCOSE 109*  BUN 12  CREATININE 0.98  CALCIUM 9.0  GFRNONAA >60  GFRAA >60  ANIONGAP 10     Hematology Recent Labs Lab 01/11/17 1758  WBC 7.3  RBC 4.63  HGB 11.7*  HCT 36.2  MCV 78.2    MCH 25.3*  MCHC 32.3  RDW 16.2*  PLT 360    Cardiac Enzymes Recent Labs Lab 01/11/17 1758 01/11/17 2254 01/12/17 0101 01/12/17 0429  TROPONINI 0.03* <0.03 <0.03 0.03*   No results for input(s): TROPIPOC in the last 168 hours.   BNP Recent Labs Lab 01/11/17 1758  BNP 24.1     DDimer  Recent Labs Lab 01/11/17 1758  DDIMER 0.63*     Radiology    Ct Angio Chest Pe W And/or Wo Contrast  Result Date: 01/11/2017 CLINICAL DATA:  Elevated D-dimer. Chest pain. History of deep venous thrombosis. EXAM: CT ANGIOGRAPHY CHEST WITH CONTRAST TECHNIQUE: Multidetector CT imaging of the chest was performed using the standard protocol during bolus administration of intravenous contrast. Multiplanar CT image reconstructions and MIPs were obtained to evaluate the vascular anatomy. CONTRAST:  100 cc Isovue 370 IV. COMPARISON:  12/17/2015 chest radiograph. 06/08/2012 chest CT angiogram. FINDINGS: Cardiovascular: The study is moderate quality for the evaluation of pulmonary embolism, with evaluation of the  segmental and subsegmental pulmonary arteries limited by motion artifact and slightly limited contrast opacification. There are no filling defects in the central, lobar, segmental or subsegmental pulmonary artery branches to suggest acute pulmonary embolism. Great vessels are normal in course and caliber. Top-normal heart size. No significant pericardial fluid/thickening. Mediastinum/Nodes: No discrete thyroid nodules. Unremarkable esophagus. No pathologically enlarged axillary, mediastinal or hilar lymph nodes. Lungs/Pleura: No pneumothorax. No pleural effusion. Peripheral left lower lobe 4 mm solid pulmonary nodule (series 8/ image 51) is stable since 06/08/2012 and considered benign. Curvilinear parenchymal band in the dependent right lower lobe is compatible with postinfectious/postinflammatory scarring, unchanged. No acute consolidative airspace disease, lung masses or new significant pulmonary  nodules. Upper abdomen: Unremarkable. Musculoskeletal: No aggressive appearing focal osseous lesions. Mild thoracic spondylosis. Review of the MIP images confirms the above findings. IMPRESSION: No evidence of pulmonary embolism.  No acute disease in the chest. Electronically Signed   By: Delbert Phenix M.D.   On: 01/11/2017 19:31    Cardiac Studies   LHC 01/12/17 Procedures   Left Heart Cath and Coronary Angiography  Conclusion    The coronaries are widely patent. Anatomy is codominant.  Luminal regularities are noted in the proximal and mid LAD. First diagonal contains 40% narrowing.  Luminal irregularities noted in the mid right.  Circumflex contains no significant obstruction.  Left ventricular pressures are normal. End-diastolic pressure is 11 mmHg. Anterior and inferobasal hyperkinesis is noted. Ejection fraction cannot be estimated due to poor opacification.     Patient Profile     46 y.o. female with h/o morbid obesity Body mass index is 62.86 kg/m., HTN, HLD, migraine headaches, h/o TIA and DVT who initially presented to [facility] 01/11/17 with complaint of 3 day history of intermittent substernal-left sided CP, slightly worse with exertion and improved with SLNTG. She ruled out for MI with negative cardiac enzymes. Felt to be a poor candidate for noninvasive cardiac imaging due to obesity/ body habitus, thus transferred to [facility] for definitive [facility]. She was found to have widely patent coronaries.   Assessment & Plan    1. Chest Pain: felt to be nonischemic CP. LHC 01/12/17 showed widely patent coronaries. Anatomy is codominant with luminal irregularities noted in the proximal and mid LAD, 40% first diag narrowing. Luminal irregularities noted in the mid right. LCx normal. LVEF could not be estimated due to poor opacification. Continue medical management of cardiac risk factors.  - her CP is improved. Currently pain free. She was given GI cocktail yesterday before cath which helped. ? GI  etiology and consider PPI therapy. Will defer to primary team.   2. Post Cath: radial cath sites stable. Will obtain BMP today to assess renal function.   3. HTN: BP elevated this morning in the 160s systolic. Due for amlodipine and chlorthalidone at 1000. Monitor.   Signed,   Robbie Lis, PA-C  01/13/2017, 8:11 AM

## 2017-01-13 NOTE — Progress Notes (Signed)
TR BAND REMOVAL  LOCATION: R radial     DEFLATED PER PROTOCOL:    Yes  TIME BAND OFF / DRESSING APPLIED:   22:05  SITE UPON ARRIVAL:    Level 0  SITE AFTER BAND REMOVAL:    Level  0  CIRCULATION SENSATION AND MOVEMENT:    Within Normal Limits   Yes  COMMENTS:   RUE elevated on pillow. Pt tolerated procedure well.

## 2017-01-13 NOTE — Discharge Summary (Signed)
Physician Discharge Summary  Terri Boyd WUJ:[id number] DOB: 05/17/1971 DOA: 01/11/2017  PCP: [name], MD  Admit date: 01/11/2017 Discharge date: 01/13/2017  Time spent: 65 minutes  Recommendations for Outpatient Follow-up:  1. Follow-up with [name], MD in 1-2 weeks. On follow-up patient will need a basic metabolic profile done to follow-up on electrolytes and renal function. Patient will also need a repeat fasting lipid panel and LFTs checked in 6 weeks to follow-up on hyperlipidemia and liver enzymes. Patient's blood pressure also need to be reassessed and medications adjusted as needed. 2. Follow-up with Dr. [name], cardiology. Office will call with an appointment time.    Discharge Diagnoses:  Principal Problem:   Chest pain with moderate risk for cardiac etiology Active Problems:   Morbid obesity (HCC)   Essential hypertension   Pure hypercholesterolemia   Discharge Condition: Stable and improved  Diet recommendation: Heart healthy  Filed Weights   01/12/17 1452 01/12/17 2020 01/13/17 0140  Weight: (!) 166.1 kg (366 lb 2.9 oz) (!) 166.1 kg (366 lb 2.9 oz) (!) 166.1 kg (366 lb 2.9 oz)    History of present illness:  Per Terri Boyd is a 46 y.o. female with medical history significant of migraines, HTN, HLD, TIA and DVT.  Patient presented to the ED with c/o headaches x 1 week, nausea x yesterday that has resolved.  Intermittent chest pain x 2 days: "pressure" like sensation, nothing seems to make better or worse although it improved with NTG in the ambulance.  Symptoms were intermittent but became constant 6 hours prior to admission and were relieved with NTG SL.  No cough, congestion, fevers, or other symptoms.  No long distance travel, no h/o DM.   ED Course: CTA PE is negative.  Trop is 0.03.   Hospital Course:  #1 chest pain Patient is a 46 year old female with multiple risk factors including hypertension hyperlipidemia TIA  and prior DVT, family history of  coronary artery disease that presented to the ED with complaints of headaches 1 week, nausea and intermittent chest pain which had worsened and described as a pressure sensation improved with nitroglycerin. CT angiogram done on the chest on admission was negative for PE or any acute abnormalities. Cardiac enzymes were cycled which were essentially negative with her last troponin of 0.03. Fasting lipid panel done had a LDL of 174. Patient was started on a statin. Cardiology was consulted and patient underwent a cardiac catheterization on 01/12/2017 that showed widely patent coronaries with a codominant anatomy, luminal irregularities were noted in the proximal and mid LAD, first diagonal contains 40% narrowing, luminal irregularities noted in the mid right. Circumflex contains no significant obstruction. Left ventricular pressures were normal. End-diastolic pressure was 11 mmHg. Anterior and inferobasal hyperkinesis was noted. 2-D echo which was done had a EF of 60-65%, no wall motion abnormalities. Patient's chest pain improved during the hospitalization with some improvement with a GI cocktail. There was some concern for possible GI etiology. Patient was maintained on a PPI daily and will be discharged home on a PPI with outpatient follow-up with PCP and cardiology.  #2 hypertension Patient noted to be hypertensive on admission. Patient was not on any antihypertensive medications during the hospitalization. Patient was started on Norvasc 5 mg daily as well as Hygroton with improvement with blood pressure. Outpatient follow-up.  #3 elevated LDL/hyperlipidemia Fasting lipid panel obtained during the hospitalization had a total cholesterol of 267 and LDL of 174. Patient was started on a statin.  Outpatient follow-up.  #4 morbid obesity  Procedures:  2-D echo 01/13/2017  cardiac catheterization 01/12/2017   CT angiogram chest  01/11/2017    Consultations:  Cardiology: Dr. Mayford Knife 01/12/2017  Discharge Exam: Vitals:   01/13/17 0755 01/13/17 1137  BP: (!) 161/77 138/79  Pulse: 85 79  Resp: 20 16  Temp: 98.2 F (36.8 C) 98.2 F (36.8 C)    General: NAD Cardiovascular: RRR Respiratory: CTAB  Discharge Instructions   Discharge Instructions    Diet - low sodium heart healthy    Complete by:  As directed    Increase activity slowly    Complete by:  As directed      Current Discharge Medication List    START taking these medications   Details  amLODipine (NORVASC) 5 MG tablet Take 1 tablet (5 mg total) by mouth daily. Qty: 30 tablet, Refills: 1    atorvastatin (LIPITOR) 40 MG tablet Take 1 tablet (40 mg total) by mouth daily at 6 PM. Qty: 30 tablet, Refills: 1    chlorthalidone (HYGROTON) 25 MG tablet Take 1 tablet (25 mg total) by mouth daily. Qty: 30 tablet, Refills: 1    nitroGLYCERIN (NITROSTAT) 0.4 MG SL tablet Place 1 tablet (0.4 mg total) under the tongue every 5 (five) minutes as needed for chest pain (CP or SOB). Qty: 10 tablet, Refills: 0    pantoprazole (PROTONIX) 40 MG tablet Take 1 tablet (40 mg total) by mouth daily at 6 (six) AM. Qty: 30 tablet, Refills: 1      CONTINUE these medications which have NOT CHANGED   Details  aspirin-acetaminophen-caffeine (EXCEDRIN MIGRAINE) 250-250-65 MG per tablet Take 2 tablets by mouth every 6 (six) hours as needed for headache.    Multiple Vitamin (MULTIVITAMIN) capsule Take 1 capsule by mouth daily.       No Known Allergies Follow-up Information    [name], MD Follow up.   Specialty:  Cardiology Why:  our office will call you with a follow-up appointment  Contact information: [address] [place] [address] [phone]        [name], MD. Schedule an appointment as soon as possible for a visit in 2 week(s).   Specialty:  Family Medicine Why:  f/u in 1-2 weeks. Contact information: [address] [place] [address] [phone]            The results of significant diagnostics from this hospitalization (including imaging, microbiology, ancillary and laboratory) are listed below for reference.    Significant Diagnostic Studies: Ct Angio Chest Pe W And/or Wo Contrast  Result Date: 01/11/2017 CLINICAL DATA:  Elevated D-dimer. Chest pain. History of deep venous thrombosis. EXAM: CT ANGIOGRAPHY CHEST WITH CONTRAST TECHNIQUE: Multidetector CT imaging of the chest was performed using the standard protocol during bolus administration of intravenous contrast. Multiplanar CT image reconstructions and MIPs were obtained to evaluate the vascular anatomy. CONTRAST:  100 cc Isovue 370 IV. COMPARISON:  12/17/2015 chest radiograph. 06/08/2012 chest CT angiogram. FINDINGS: Cardiovascular: The study is moderate quality for the evaluation of pulmonary embolism, with evaluation of the segmental and subsegmental pulmonary arteries limited by motion artifact and slightly limited contrast opacification. There are no filling defects in the central, lobar, segmental or subsegmental pulmonary artery branches to suggest acute pulmonary embolism. Great vessels are normal in course and caliber. Top-normal heart size. No significant pericardial fluid/thickening. Mediastinum/Nodes: No discrete thyroid nodules. Unremarkable esophagus. No pathologically enlarged axillary, mediastinal or hilar lymph nodes. Lungs/Pleura: No  pneumothorax. No pleural effusion. Peripheral left lower lobe 4 mm solid pulmonary nodule (series 8/ image 51) is stable since 06/08/2012 and considered benign. Curvilinear parenchymal band in the dependent right lower lobe is compatible with postinfectious/postinflammatory scarring, unchanged. No acute consolidative airspace disease, lung masses or new significant pulmonary nodules. Upper abdomen: Unremarkable. Musculoskeletal: No aggressive appearing focal osseous lesions. Mild thoracic  spondylosis. Review of the MIP images confirms the above findings. IMPRESSION: No evidence of pulmonary embolism.  No acute disease in the chest. Electronically Signed   By: Delbert Phenix M.D.   On: 01/11/2017 19:31    Microbiology: No results found for this or any previous visit (from the past 240 hour(s)).   Labs: Basic Metabolic Panel:  Recent Labs Lab 01/11/17 1758 01/13/17 0905  NA 136 136  K 3.5 3.7  CL 103 104  CO2 23 23  GLUCOSE 109* 109*  BUN 12 8  CREATININE 0.98 0.89  CALCIUM 9.0 8.9  MG  --  2.2   Liver Function Tests: No results for input(s): AST, ALT, ALKPHOS, BILITOT, PROT, ALBUMIN in the last 168 hours. No results for input(s): LIPASE, AMYLASE in the last 168 hours. No results for input(s): AMMONIA in the last 168 hours. CBC:  Recent Labs Lab 01/11/17 1758 01/13/17 0905  WBC 7.3 6.3  NEUTROABS 2.8 3.1  HGB 11.7* 11.5*  HCT 36.2 35.7*  MCV 78.2 78.3  PLT 360 353   Cardiac Enzymes:  Recent Labs Lab 01/11/17 1758 01/11/17 2254 01/12/17 0101 01/12/17 0429  TROPONINI 0.03* <0.03 <0.03 0.03*   BNP: BNP (last 3 results)  Recent Labs  01/11/17 1758  BNP 24.1    ProBNP (last 3 results) No results for input(s): PROBNP in the last 8760 hours.  CBG: No results for input(s): GLUCAP in the last 168 hours.     SignedRamiro Harvest MD.  [facility] 01/13/2017, 2:00 PM

## 2017-01-21 ENCOUNTER — Encounter: Payer: Self-pay | Admitting: Cardiology

## 2017-02-12 ENCOUNTER — Ambulatory Visit: Payer: Medicaid Other | Admitting: Cardiology

## 2018-01-24 ENCOUNTER — Encounter (HOSPITAL_BASED_OUTPATIENT_CLINIC_OR_DEPARTMENT_OTHER): Payer: Self-pay

## 2018-01-24 ENCOUNTER — Emergency Department (HOSPITAL_BASED_OUTPATIENT_CLINIC_OR_DEPARTMENT_OTHER)
Admission: EM | Admit: 2018-01-24 | Discharge: 2018-01-24 | Disposition: A | Discharge status: Home / Self Care | Payer: Self-pay | Attending: Emergency Medicine | Admitting: Emergency Medicine

## 2018-01-24 ENCOUNTER — Other Ambulatory Visit: Payer: Self-pay

## 2018-01-24 ENCOUNTER — Emergency Department (HOSPITAL_BASED_OUTPATIENT_CLINIC_OR_DEPARTMENT_OTHER): Payer: Self-pay

## 2018-01-24 DIAGNOSIS — Z87891 Personal history of nicotine dependence: Secondary | ICD-10-CM | POA: Insufficient documentation

## 2018-01-24 DIAGNOSIS — I10 Essential (primary) hypertension: Secondary | ICD-10-CM | POA: Insufficient documentation

## 2018-01-24 DIAGNOSIS — J069 Acute upper respiratory infection, unspecified: Secondary | ICD-10-CM | POA: Insufficient documentation

## 2018-01-24 DIAGNOSIS — B9789 Other viral agents as the cause of diseases classified elsewhere: Secondary | ICD-10-CM

## 2018-01-24 DIAGNOSIS — J45909 Unspecified asthma, uncomplicated: Secondary | ICD-10-CM | POA: Insufficient documentation

## 2018-01-24 DIAGNOSIS — Z79899 Other long term (current) drug therapy: Secondary | ICD-10-CM | POA: Insufficient documentation

## 2018-01-24 DIAGNOSIS — Z8673 Personal history of transient ischemic attack (TIA), and cerebral infarction without residual deficits: Secondary | ICD-10-CM | POA: Insufficient documentation

## 2018-01-24 MED ORDER — BENZONATATE 100 MG PO CAPS: 100.0000 mg | ORAL_CAPSULE | Freq: Three times a day (TID) | ORAL | 0 refills | Status: DC

## 2018-01-24 MED ORDER — FLUTICASONE PROPIONATE 50 MCG/ACT NA SUSP: 1.0000 | Freq: Every day | NASAL | 2 refills | Status: DC

## 2018-01-24 MED ORDER — ALBUTEROL SULFATE HFA 108 (90 BASE) MCG/ACT IN AERS: 1.0000 | INHALATION_SPRAY | Freq: Four times a day (QID) | RESPIRATORY_TRACT | 0 refills | Status: DC | PRN

## 2018-01-24 NOTE — ED Provider Notes (Signed)
[facility] EMERGENCY DEPARTMENT Provider Note   CSN: [id number] Arrival date & time: 01/24/18  1522     History   Chief Complaint Chief Complaint  Patient presents with  . Cough    HPI Terri Boyd is a 47 y.o. female [name] for evaluation of persistent cough is been ongoing for the last week.  Patient reports that she was seen a week ago for evaluation of flulike symptoms and was diagnosed with flulike illness.  Patient reports that she has been having fever, nasal congestion, rhinorrhea, body aches, cough.  Patient reports that her fever and other symptoms improved but states that she still having persistent cough, nasal congestion.  Patient reports her cough is productive of yellow sputum.  Patient states she is not currently having any fevers.  Patient reports some chest soreness associated with coughing but otherwise denies any chest pain, difficulty breathing.  She states that she also feels like her bilateral ears are stopped up.  Patient denies any sore throat, difficulty swallowing, vomiting, abdominal pain.  The history is provided by the patient.    Past Medical History:  Diagnosis Date  . Asthma   . DVT (deep venous thrombosis) (HCC)   . High cholesterol   . Hypertension   . Migraine   . Obesity   . Stroke (HCC)   . TIA (transient ischemic attack)     Patient Active Problem List   Diagnosis Date Noted  . Morbid obesity (HCC)   . Essential hypertension   . Pure hypercholesterolemia   . Chest pain with moderate risk for cardiac etiology 01/11/2017  . Right shoulder pain 02/17/2013  . Migraine     Past Surgical History:  Procedure Laterality Date  . LEFT HEART CATH AND CORONARY ANGIOGRAPHY N/A 01/12/2017   Procedure: Left Heart Cath and Coronary Angiography;  Surgeon: Lyn Records, MD;  Location: [facility] INVASIVE CV LAB;  Service: Cardiovascular;  Laterality: N/A;  . TUBAL LIGATION       OB History   None      Home Medications    Prior to  Admission medications   Medication Sig Start Date End Date Taking? Authorizing Provider  amLODipine (NORVASC) 5 MG tablet Take 1 tablet (5 mg total) by mouth daily. 01/14/17  Yes Rodolph Bong, MD  aspirin-acetaminophen-caffeine (EXCEDRIN MIGRAINE) [phone] MG per tablet Take 2 tablets by mouth every 6 (six) hours as needed for headache.   Yes [provider]  atorvastatin (LIPITOR) 40 MG tablet Take 1 tablet (40 mg total) by mouth daily at 6 PM. 01/13/17  Yes Rodolph Bong, MD  chlorthalidone (HYGROTON) 25 MG tablet Take 1 tablet (25 mg total) by mouth daily. 01/14/17  Yes Rodolph Bong, MD  Multiple Vitamin (MULTIVITAMIN) capsule Take 1 capsule by mouth daily.   Yes [provider]  nitroGLYCERIN (NITROSTAT) 0.4 MG SL tablet Place 1 tablet (0.4 mg total) under the tongue every 5 (five) minutes as needed for chest pain (CP or SOB). 01/13/17  Yes Rodolph Bong, MD  pantoprazole (PROTONIX) 40 MG tablet Take 1 tablet (40 mg total) by mouth daily at 6 (six) AM. 01/14/17  Yes Rodolph Bong, MD  albuterol (PROVENTIL HFA;VENTOLIN HFA) 108 (90 Base) MCG/ACT inhaler Inhale 1-2 puffs into the lungs every 6 (six) hours as needed for wheezing or shortness of breath. 01/24/18   Maxwell Caul, PA-C  benzonatate (TESSALON) 100 MG capsule Take 1 capsule (100 mg total) by mouth every 8 (eight)  hours. 01/24/18   Maxwell Caul, PA-C  fluticasone (FLONASE) 50 MCG/ACT nasal spray Place 1 spray into both nostrils daily. 01/24/18   Maxwell Caul, PA-C    Family History Family History  Problem Relation Age of Onset  . CAD Father 65       MI/stents     Social History Social History   Tobacco Use  . Smoking status: Former Games developer  . Smokeless tobacco: Never Used  Substance Use Topics  . Alcohol use: Yes    Comment: occ  . Drug use: No     Allergies   Patient has no known allergies.   Review of Systems Review of Systems  Constitutional: Negative for fever.    HENT: Positive for congestion and rhinorrhea. Negative for sore throat and trouble swallowing.   Respiratory: Positive for cough. Negative for shortness of breath.   Cardiovascular: Negative for chest pain.  Gastrointestinal: Negative for abdominal pain, nausea and vomiting.     Physical Exam Updated Vital Signs BP (!) 156/90 (BP Location: Right Arm)   Pulse 81   Temp 99.6 F (37.6 C) (Oral)   Resp 18   Ht  (1.626 m)   Wt (!) 166 kg (365 lb 15.4 oz)   LMP 01/10/2018   SpO2 100%   BMI 62.82 kg/m   Physical Exam  Constitutional: She appears well-developed and well-nourished.  HENT:  Head: Normocephalic and atraumatic.  Right Ear: Tympanic membrane normal. Tympanic membrane is not erythematous and not bulging.  Left Ear: Tympanic membrane normal. Tympanic membrane is not erythematous and not bulging.  Nose: Mucosal edema and rhinorrhea present. Right sinus exhibits no maxillary sinus tenderness and no frontal sinus tenderness. Left sinus exhibits no maxillary sinus tenderness and no frontal sinus tenderness.  Mouth/Throat: Uvula is midline, oropharynx is clear and moist and mucous membranes are normal. No trismus in the jaw.  Airways patent, phonation is intact.  Posterior oropharynx is clear without any signs of erythema, edema, exudates.  No evidence of peritonsillar abscess.  Uvula is midline without any trismus.  Eyes: Conjunctivae and EOM are normal. Right eye exhibits no discharge. Left eye exhibits no discharge. No scleral icterus.  Pulmonary/Chest: Effort normal and breath sounds normal. She has no decreased breath sounds. She has no wheezes.  Currently coughing.  Able to speak in full sentences without any difficulty.  Neurological: She is alert.  Skin: Skin is warm and dry.  Psychiatric: She has a normal mood and affect. Her speech is normal and behavior is normal.  Nursing note and vitals reviewed.    ED Treatments / Results  Labs (all labs ordered are listed,  but only abnormal results are displayed) Labs Reviewed - No data to display  EKG None  Radiology Dg Chest 2 View  Result Date: 01/24/2018 CLINICAL DATA:  Cough, shortness of breath. EXAM: CHEST - 2 VIEW COMPARISON:  Radiographs of December 17, 2015. FINDINGS: The heart size and mediastinal contours are within normal limits. Both lungs are clear. No pneumothorax or pleural effusion is noted. The visualized skeletal structures are unremarkable. IMPRESSION: No active cardiopulmonary disease. Electronically Signed   By: Lupita Raider, M.D.   On: 01/24/2018 17:42    Procedures Procedures (including critical care time)  Medications Ordered in ED Medications - No data to display   Initial Impression / Assessment and Plan / ED Course  I have reviewed the triage vital signs and the nursing notes.  Pertinent labs & imaging results that were  available during my care of the patient were reviewed by me and considered in my medical decision making (see chart for details).     47 year old female who presents for evaluation of cough, nasal congestion, clogged bilateral ears.  Patient reports recently diagnosed with flu 1 week ago.  Patient reports that since then, she has had persistent cough.  Patient reports that she was having fever with initial onset of flu but states the fever has resolved.  He states she has not had any fever over the last 5 days.  Patient reports that she has had persistent cough.  She states cough is productive of yellow sputum.  Patient reports some continued nasal congestion.  Patient reports that she has some chest soreness associated only with the cough.  No chest pain or shortness of breath otherwise.  Initial ED arrival, patient is afebrile, slightly tachycardic and hypertensive.  O2 sats are 98% on room air.  On exam, lungs with no evidence of wheezing, decreased breath sounds.  Patient with no evidence of respiratory distress.  Patient does have evidence of nasal mucosal  edema.  Bilateral ears are without signs of infection or not concerning for acute otitis media.  Consider upper respiratory infection versus pneumonia.  History/physical exam is not concerning for ACS etiology, PE.  Will plan for chest x-ray evaluation.  Chest x-ray reviewed.  Negative for any acute infectious etiology.  Gust results with patient.  Symptoms likely result of viral upper respiratory infection.  We will plan to send patient home with supportive at home measures.  Patient instructed to follow-up with her primary care doctor in the next 2 to 4 days for further evaluation.  Repeat vitals show tachycardia has improved.  O2 sats are greater than 95% on room air. Patient had ample opportunity for questions and discussion. All patient's questions were answered with full understanding. Strict return precautions discussed. Patient expresses understanding and agreement to plan.   Final Clinical Impressions(s) / ED Diagnoses   Final diagnoses:  Viral URI with cough    ED Discharge Orders        Ordered    fluticasone (FLONASE) 50 MCG/ACT nasal spray  Daily     01/24/18 1806    benzonatate (TESSALON) 100 MG capsule  Every 8 hours     01/24/18 1806    albuterol (PROVENTIL HFA;VENTOLIN HFA) 108 (90 Base) MCG/ACT inhaler  Every 6 hours PRN     01/24/18 1806       Rosana Hoes 01/24/18 1940    Shaune Pollack, MD 01/25/18 [phone]

## 2018-01-24 NOTE — ED Triage Notes (Signed)
C/o flu like sx x 1 week-NAD-steady gait 

## 2018-01-24 NOTE — Discharge Instructions (Signed)
You can take Tylenol or Ibuprofen as directed for pain. You can alternate Tylenol and Ibuprofen every 4 hours. If you take Tylenol at 1pm, then you can take Ibuprofen at 5pm. Then you can take Tylenol again at 9pm.   Take Flonase as directed.  Take Tessalon Perles as directed.  Use albuterol inhaler as directed to help with bronchospasm.  Follow-up with your primary care doctor in the next 3 to 4 days for further evaluation.  Return the emergency department for any persistent fever, chest pain, difficulty breathing or any other worsening or concerning symptoms.

## 2018-01-25 MED FILL — PROVENTIL HFA 108 (90 BASE): 108 (90 BAS | 25 days supply | Qty: 7 | Fill #0

## 2018-01-25 MED FILL — FLUTICASONE PROP 50 MCG SPR: 50 | 60 days supply | Qty: 16 | Fill #0

## 2018-01-31 ENCOUNTER — Emergency Department (HOSPITAL_BASED_OUTPATIENT_CLINIC_OR_DEPARTMENT_OTHER): Payer: Self-pay

## 2018-01-31 ENCOUNTER — Emergency Department (HOSPITAL_BASED_OUTPATIENT_CLINIC_OR_DEPARTMENT_OTHER)
Admission: EM | Admit: 2018-01-31 | Discharge: 2018-01-31 | Disposition: A | Discharge status: Home / Self Care | Payer: Self-pay | Attending: Emergency Medicine | Admitting: Emergency Medicine

## 2018-01-31 ENCOUNTER — Other Ambulatory Visit: Payer: Self-pay

## 2018-01-31 ENCOUNTER — Encounter (HOSPITAL_BASED_OUTPATIENT_CLINIC_OR_DEPARTMENT_OTHER): Payer: Self-pay | Admitting: *Deleted

## 2018-01-31 DIAGNOSIS — Z79899 Other long term (current) drug therapy: Secondary | ICD-10-CM | POA: Insufficient documentation

## 2018-01-31 DIAGNOSIS — I10 Essential (primary) hypertension: Secondary | ICD-10-CM | POA: Insufficient documentation

## 2018-01-31 DIAGNOSIS — Z955 Presence of coronary angioplasty implant and graft: Secondary | ICD-10-CM | POA: Insufficient documentation

## 2018-01-31 DIAGNOSIS — J45909 Unspecified asthma, uncomplicated: Secondary | ICD-10-CM | POA: Insufficient documentation

## 2018-01-31 DIAGNOSIS — Z8673 Personal history of transient ischemic attack (TIA), and cerebral infarction without residual deficits: Secondary | ICD-10-CM | POA: Insufficient documentation

## 2018-01-31 DIAGNOSIS — R197 Diarrhea, unspecified: Secondary | ICD-10-CM | POA: Insufficient documentation

## 2018-01-31 DIAGNOSIS — K625 Hemorrhage of anus and rectum: Secondary | ICD-10-CM | POA: Insufficient documentation

## 2018-01-31 LAB — COMPREHENSIVE METABOLIC PANEL
ALBUMIN: 3.4 g/dL — AB (ref 3.5–5.0)
ALK PHOS: 99 U/L (ref 38–126)
ALT: 16 U/L (ref 14–54)
ANION GAP: 9 (ref 5–15)
AST: 18 U/L (ref 15–41)
BILIRUBIN TOTAL: 0.3 mg/dL (ref 0.3–1.2)
BUN: 14 mg/dL (ref 6–20)
CALCIUM: 8.7 mg/dL — AB (ref 8.9–10.3)
CO2: 24 mmol/L (ref 22–32)
CREATININE: 0.94 mg/dL (ref 0.44–1.00)
Chloride: 105 mmol/L (ref 101–111)
GFR calc Af Amer: >60 mL/min (ref >60)
GFR calc non Af Amer: >60 mL/min (ref >60)
GLUCOSE: 87 mg/dL (ref 65–99)
Potassium: 3.6 mmol/L (ref 3.5–5.1)
SODIUM: 138 mmol/L (ref 135–145)
TOTAL PROTEIN: 7.4 g/dL (ref 6.5–8.1)

## 2018-01-31 LAB — URINALYSIS, ROUTINE W REFLEX MICROSCOPIC
Bilirubin Urine: NEGATIVE
GLUCOSE, UA: NEGATIVE mg/dL
KETONES UR: NEGATIVE mg/dL
LEUKOCYTES UA: NEGATIVE
Nitrite: NEGATIVE
PH: 6 (ref 5.0–8.0)
PROTEIN: NEGATIVE mg/dL
Specific Gravity, Urine: 1.01 (ref 1.005–1.030)

## 2018-01-31 LAB — URINALYSIS, MICROSCOPIC (REFLEX)

## 2018-01-31 LAB — CBC
HCT: 33.7 % — ABNORMAL LOW (ref 36.0–46.0)
Hemoglobin: 11.3 g/dL — ABNORMAL LOW (ref 12.0–15.0)
MCH: 26.1 pg (ref 26.0–34.0)
MCHC: 33.5 g/dL (ref 30.0–36.0)
MCV: 77.8 fL — ABNORMAL LOW (ref 78.0–100.0)
Platelets: 359 10*3/uL (ref 150–400)
RBC: 4.33 MIL/uL (ref 3.87–5.11)
RDW: 16.2 % — ABNORMAL HIGH (ref 11.5–15.5)
WBC: 7.1 10*3/uL (ref 4.0–10.5)

## 2018-01-31 LAB — OCCULT BLOOD X 1 CARD TO LAB, STOOL: FECAL OCCULT BLD: NEGATIVE

## 2018-01-31 LAB — PREGNANCY, URINE: PREG TEST UR: NEGATIVE

## 2018-01-31 MED ORDER — HYDROCORTISONE 2.5 % RE CREA: TOPICAL_CREAM | RECTAL | 0 refills | Status: DC

## 2018-01-31 MED ORDER — IOPAMIDOL (ISOVUE-300) INJECTION 61%
100.0000 mL | Freq: Once | INTRAVENOUS | Status: AC | PRN
  Administered 2018-01-31: 100 mL via INTRAVENOUS

## 2018-01-31 NOTE — ED Provider Notes (Signed)
[facility] EMERGENCY DEPARTMENT Provider Note   CSN: [id number] Arrival date & time: 01/31/18  1216     History   Chief Complaint Chief Complaint  Patient presents with  . Rectal Bleeding    HPI Terri Boyd is a 47 y.o. female.  HPI 47 year old African-American female past medical history significant for DVT not currently anticoagulated, hypertension, obesity, asthma, TIA presents to the emergency department today for evaluation of rectal bleeding.  Patient reports over the past week she is has noticed blood on the toilet paper when she wipes after a bowel movement.  Patient denies any history of constipation or straining with bowel movements..  She states that yesterday she felt that there was more blood in the toilet than usual and was very concerned.  She had 2 episodes like this.  Patient states that today she just had some spotting.  Patient denies blood with every bowel movement.  Patient denies any associated abdominal pain or rectal pain.  Does report some diarrhea.  She also reports starting her menstrual cycle today.  Patient denies any associated nausea or emesis.  Denies fever.  Patient has not taken anything for her symptoms prior to arrival.  Nothing makes better or worse.  She does report having some lower back pain that she associates with possibly her menstrual cycle.  Patient reports having urinary urgency and frequency at night which is been ongoing for several months.  No new urinary symptoms.  Denies any blood thinners or chronic NSAID use.  Denies any recent travel outside the [place].  Pt denies any fever, chill, ha, vision changes, lightheadedness, dizziness, congestion, neck pain, cp, sob, cough, abd pain, n/v/d, melena,  lower extremity paresthesias.   Past Medical History:  Diagnosis Date  . Asthma   . DVT (deep venous thrombosis) (HCC)   . High cholesterol   . Hypertension   . Migraine   . Obesity   . Stroke (HCC)   . TIA (transient ischemic  attack)     Patient Active Problem List   Diagnosis Date Noted  . Morbid obesity (HCC)   . Essential hypertension   . Pure hypercholesterolemia   . Chest pain with moderate risk for cardiac etiology 01/11/2017  . Right shoulder pain 02/17/2013  . Migraine     Past Surgical History:  Procedure Laterality Date  . LEFT HEART CATH AND CORONARY ANGIOGRAPHY N/A 01/12/2017   Procedure: Left Heart Cath and Coronary Angiography;  Surgeon: Lyn Records, MD;  Location: [facility] INVASIVE CV LAB;  Service: Cardiovascular;  Laterality: N/A;  . TUBAL LIGATION       OB History   None      Home Medications    Prior to Admission medications   Medication Sig Start Date End Date Taking? Authorizing Provider  albuterol (PROVENTIL HFA;VENTOLIN HFA) 108 (90 Base) MCG/ACT inhaler Inhale 1-2 puffs into the lungs every 6 (six) hours as needed for wheezing or shortness of breath. 01/24/18   Maxwell Caul, PA-C  amLODipine (NORVASC) 5 MG tablet Take 1 tablet (5 mg total) by mouth daily. 01/14/17   Rodolph Bong, MD  aspirin-acetaminophen-caffeine (EXCEDRIN MIGRAINE) [id number] MG per tablet Take 2 tablets by mouth every 6 (six) hours as needed for headache.    [provider]  atorvastatin (LIPITOR) 40 MG tablet Take 1 tablet (40 mg total) by mouth daily at 6 PM. 01/13/17   Rodolph Bong, MD  benzonatate (TESSALON) 100 MG capsule Take 1 capsule (100 mg total)  by mouth every 8 (eight) hours. 01/24/18   Maxwell Caul, PA-C  chlorthalidone (HYGROTON) 25 MG tablet Take 1 tablet (25 mg total) by mouth daily. 01/14/17   Rodolph Bong, MD  fluticasone (FLONASE) 50 MCG/ACT nasal spray Place 1 spray into both nostrils daily. 01/24/18   Maxwell Caul, PA-C  Multiple Vitamin (MULTIVITAMIN) capsule Take 1 capsule by mouth daily.    [provider]  nitroGLYCERIN (NITROSTAT) 0.4 MG SL tablet Place 1 tablet (0.4 mg total) under the tongue every 5 (five) minutes as needed for chest pain (CP  or SOB). 01/13/17   Rodolph Bong, MD  pantoprazole (PROTONIX) 40 MG tablet Take 1 tablet (40 mg total) by mouth daily at 6 (six) AM. 01/14/17   Rodolph Bong, MD    Family History Family History  Problem Relation Age of Onset  . CAD Father 71       MI/stents     Social History Social History   Tobacco Use  . Smoking status: Former Games developer  . Smokeless tobacco: Never Used  Substance Use Topics  . Alcohol use: Yes    Comment: occ  . Drug use: No     Allergies   Patient has no known allergies.   Review of Systems Review of Systems  All other systems reviewed and are negative.    Physical Exam Updated Vital Signs BP 134/87 (BP Location: Left Arm)   Pulse 72   Temp 98.8 F (37.1 C) (Oral)   Resp 20   Ht  (1.626 m)   Wt (!) 165.6 kg (365 lb)   LMP 01/10/2018   SpO2 99%   BMI 62.65 kg/m   Physical Exam  Constitutional: She is oriented to person, place, and time. She appears well-developed and well-nourished.  Non-toxic appearance. No distress.  HENT:  Head: Normocephalic and atraumatic.  Nose: Nose normal.  Mouth/Throat: Oropharynx is clear and moist.  Eyes: Pupils are equal, round, and reactive to light. Conjunctivae are normal. Right eye exhibits no discharge. Left eye exhibits no discharge. No scleral icterus.  Neck: Normal range of motion. Neck supple.  Cardiovascular: Normal rate, regular rhythm, normal heart sounds and intact distal pulses.  Pulmonary/Chest: Effort normal and breath sounds normal. No respiratory distress. She exhibits no tenderness.  Abdominal: Soft. Bowel sounds are normal. There is no tenderness. There is no rigidity, no rebound, no guarding, no CVA tenderness, no tenderness at McBurney's point and negative Murphy's sign.  Obese abdomen  Genitourinary:  Genitourinary Comments: Chaperone present for exam. Pt tolerated without difficulty. No external hemorrhoids or fissures noted. No pain with palpation of the rectal vault. No  internal hemorrhoids noted. Soft brown stool noted in the rectal vault. No gross hematochezia or melena. Hemoccult negative.   Musculoskeletal: Normal range of motion. She exhibits no tenderness.  Lymphadenopathy:    She has no cervical adenopathy.  Neurological: She is alert and oriented to person, place, and time.  Skin: Skin is warm and dry. Capillary refill takes less than 2 seconds. No pallor.  Psychiatric: Her behavior is normal. Judgment and thought content normal.  Nursing note and vitals reviewed.    ED Treatments / Results  Labs (all labs ordered are listed, but only abnormal results are displayed) Labs Reviewed  CBC - Abnormal; Notable for the following components:      Result Value   Hemoglobin 11.3 (*)    HCT 33.7 (*)    MCV 77.8 (*)    RDW 16.2 (*)  All other components within normal limits  COMPREHENSIVE METABOLIC PANEL - Abnormal; Notable for the following components:   Calcium 8.7 (*)    Albumin 3.4 (*)    All other components within normal limits  PREGNANCY, URINE  OCCULT BLOOD X 1 CARD TO LAB, STOOL  URINALYSIS, ROUTINE W REFLEX MICROSCOPIC    EKG None  Radiology Ct Abdomen Pelvis W Contrast  Result Date: 01/31/2018 CLINICAL DATA:  Rectal bleeding x2 days. EXAM: CT ABDOMEN AND PELVIS WITH CONTRAST TECHNIQUE: Multidetector CT imaging of the abdomen and pelvis was performed using the standard protocol following bolus administration of intravenous contrast. CONTRAST:  ISOVUE-300 IOPAMIDOL (ISOVUE-300) INJECTION 61% COMPARISON:  04/01/2016 FINDINGS: LOWER CHEST: Lung bases are clear. Minimal atelectasis at the right lung base. Included heart size is normal. No pericardial effusion. HEPATOBILIARY: Liver and gallbladder are normal. PANCREAS: Normal. SPLEEN: Normal. ADRENALS/URINARY TRACT: Kidneys are orthotopic, demonstrating symmetric enhancement. No nephrolithiasis, hydronephrosis or solid renal masses. Urinary bladder is partially distended and  unremarkable. Normal adrenal glands. STOMACH/BOWEL: The stomach, small and large bowel are normal in course and caliber without inflammatory changes. Normal appendix. VASCULAR/LYMPHATIC: Aortoiliac vessels are normal in course and caliber. No lymphadenopathy by CT size criteria. REPRODUCTIVE: Normal.  Tampon artifact noted in the vagina. OTHER: No intraperitoneal free fluid or free air. Small fat containing umbilical hernia. MUSCULOSKELETAL: Nonacute. IMPRESSION: No acute intraabdominal nor pelvic abnormality. No findings to explain the patient's rectal bleeding. Electronically Signed   By: Tollie Eth M.D.   On: 01/31/2018 17:33    Procedures Procedures (including critical care time)  Medications Ordered in ED Medications  iopamidol (ISOVUE-300) 61 % injection 100 mL (100 mLs Intravenous Contrast Given 01/31/18 1720)     Initial Impression / Assessment and Plan / ED Course  I have reviewed the triage vital signs and the nursing notes.  Pertinent labs & imaging results that were available during my care of the patient were reviewed by me and considered in my medical decision making (see chart for details).     Patient presents to the ED for evaluation of rectal bleeding.  Ongoing for the past week and intermittent.  Patient has no history of rectal bleeding.  Denies any history of constipation.  Patient not currently on blood thinners or chronic NSAID user.  Patient's vital signs reassuring.  No hypotension or tachycardia noted.  Patient appears to be in no acute distress.  Lab work is reassuring.  No leukocytosis.  Hemoglobin is improved from patient's baseline at 11.3.  Normal kidney function and BUN.  Normal liver enzymes.  Occult stool was negative.  UA showed no signs of infection.  Does have RBCs consistent with her menstrual cycle.  Small amount of WBCs and rare bacteria.  Negative nitrite.  Will send for culture however given patient has no symptoms will not treat at this time.   Negative pregnancy test.  Patient has no focal abdominal tenderness on palpation.  Bowel sounds present in all 4 quadrants.  No CVA tenderness.  No tachycardia noted.  CTA of abdomen revealed no acute findings.  I suspect the patient's rectal bleeding is secondary to hemorrhoids.  Do not feel the patient has any signs of acute blood loss.  Patient is currently on her menstrual cycle which is likely leading to her low back pain.  Patient does not meet any Sirs or sepsis criteria.  We will give patient Anusol for any internal hemorrhoids.  Instructed to follow-up with her primary care doctor and GI and  return precautions were discussed.  Pt is hemodynamically stable, in NAD, & able to ambulate in the ED. Evaluation does not show pathology that would require ongoing emergent intervention or inpatient treatment. I explained the diagnosis to the patient. Pain has been managed & has no complaints prior to dc. Pt is comfortable with above plan and is stable for discharge at this time. All questions were answered prior to disposition. Strict return precautions for f/u to the ED were discussed. Encouraged follow up with PCP.   Final Clinical Impressions(s) / ED Diagnoses   Final diagnoses:  Rectal bleeding    ED Discharge Orders    None       Wallace Keller 01/31/18 Salome Holmes, MD 02/07/18 1810

## 2018-01-31 NOTE — ED Triage Notes (Signed)
Bright red rectal bleeding yesterday x 2 and once today. Lower back pain.

## 2018-01-31 NOTE — Discharge Instructions (Addendum)
Your work-up has been very reassuring in the emergency department.  Your lab work was reassuring.  CAT scan showed no acute findings.  I suspect that your rectal bleeding may be secondary to hemorrhoids.  Have given you a medication called Anusol.  Take as prescribed.  Your urine was sent for culture.  Doubt any infection given that you have no urinary symptoms however this grow something out will need to be treated.  Follow-up with GI doctor if symptoms persist and your primary care doctor as well.  Return the ED with any worsening symptoms.

## 2018-02-02 LAB — URINE CULTURE: Culture: >=100000 — AB

## 2018-02-03 ENCOUNTER — Telehealth: Payer: Self-pay | Admitting: *Deleted

## 2018-02-03 NOTE — Progress Notes (Signed)
ED Antimicrobial Stewardship Positive Culture Follow Up   Terri Boyd is an 47 y.o. female who presented to [facility] on 01/31/2018 with a chief complaint of  Chief Complaint  Patient presents with  . Rectal Bleeding    Recent Results (from the past 720 hour(s))  Urine culture     Status: Abnormal   Collection Time: 01/31/18  4:24 PM  Result Value Ref Range Status   Specimen Description   Final    URINE, CLEAN CATCH Performed at [facility], [address] [place], [place] [address]    Special Requests   Final    NONE Performed at [facility], [address] [place], [place] [address]    Culture (A)  Final    >=100,000 COLONIES/mL CORYNEBACTERIUM SPECIES Standardized susceptibility testing for this organism is not available. Performed at [facility], [address] [place], [place] [address]    Report Status 02/02/2018 FINAL  Final      Patient discharged originally without antimicrobial agent and treatment is now indicated  New antibiotic prescription: Cephalexin 500 mg BID X 7 days  ED Provider: Terance Hart, PA-C   Della Goo 02/03/2018, 11:32 AM Infectious Diseases Pharmacy Resident  Phone# [phone]

## 2018-02-03 NOTE — Telephone Encounter (Signed)
Post ED Visit - Positive Culture Follow-up: Unsuccessful Patient Follow-up  Culture assessed and recommendations reviewed by:   Enzo Bi, Pharm.D.  Celedonio Miyamoto, Pharm.D., BCPS AQ-ID  Garvin Fila, Pharm.D., BCPS  Georgina Pillion, [address].D., BCPS  [place], [address].D., BCPS, AAHIVP  Estella Husk, Pharm.D., BCPS, AAHIVP  Sherlynn Carbon, PharmD  Pollyann Samples, PharmD, BCPS  Positive urine culture, reviewed by Terance Hart, PA-C   Patient discharged without antimicrobial prescription and treatment is now indicated:  Cephalexin  PO BID x 7 days  Organism is resistant to prescribed ED discharge antimicrobial  Patient with positive blood cultures   Unable to contact patient after 3 attempts, letter will be sent to address on file  [name] 02/03/2018, 12:27 PM

## 2018-04-18 ENCOUNTER — Encounter (HOSPITAL_BASED_OUTPATIENT_CLINIC_OR_DEPARTMENT_OTHER): Payer: Self-pay | Admitting: *Deleted

## 2018-04-18 ENCOUNTER — Emergency Department (HOSPITAL_BASED_OUTPATIENT_CLINIC_OR_DEPARTMENT_OTHER)
Admission: EM | Admit: 2018-04-18 | Discharge: 2018-04-18 | Disposition: A | Discharge status: Home / Self Care | Payer: Medicaid Other | Attending: Emergency Medicine | Admitting: Emergency Medicine

## 2018-04-18 ENCOUNTER — Other Ambulatory Visit: Payer: Self-pay

## 2018-04-18 DIAGNOSIS — Z79899 Other long term (current) drug therapy: Secondary | ICD-10-CM | POA: Insufficient documentation

## 2018-04-18 DIAGNOSIS — J069 Acute upper respiratory infection, unspecified: Secondary | ICD-10-CM

## 2018-04-18 DIAGNOSIS — I10 Essential (primary) hypertension: Secondary | ICD-10-CM | POA: Insufficient documentation

## 2018-04-18 DIAGNOSIS — Z87891 Personal history of nicotine dependence: Secondary | ICD-10-CM | POA: Insufficient documentation

## 2018-04-18 DIAGNOSIS — H5789 Other specified disorders of eye and adnexa: Secondary | ICD-10-CM | POA: Insufficient documentation

## 2018-04-18 DIAGNOSIS — J45909 Unspecified asthma, uncomplicated: Secondary | ICD-10-CM | POA: Insufficient documentation

## 2018-04-18 DIAGNOSIS — B9789 Other viral agents as the cause of diseases classified elsewhere: Secondary | ICD-10-CM | POA: Insufficient documentation

## 2018-04-18 NOTE — ED Triage Notes (Signed)
Pt co URI sytmpoms x 3 days

## 2018-04-18 NOTE — ED Provider Notes (Signed)
[facility] EMERGENCY DEPARTMENT Provider Note   CSN: [id number] Arrival date & time: 04/18/18  1449     History   Chief Complaint Chief Complaint  Patient presents with  . URI    HPI Terri Boyd is a 47 y.o. female.  HPI  47 year old female presents with upper respiratory symptoms.  Started a few days ago.  Had some subjective fever and chills for the first couple days but that has resolved.  Has multiple other symptoms including dry cough, sore throat, bilateral ear pain and intermittent left eye clear drainage and discomfort.  Some redness.  Family member recently had pinkeye.  The patient does not feel short of breath and has not been having vomiting or headache.  Has tried Mucinex, cough drops and some other over-the-counter remedies.  Past Medical History:  Diagnosis Date  . Asthma   . DVT (deep venous thrombosis) (HCC)   . High cholesterol   . Hypertension   . Migraine   . Obesity   . Stroke (HCC)   . TIA (transient ischemic attack)     Patient Active Problem List   Diagnosis Date Noted  . Morbid obesity (HCC)   . Essential hypertension   . Pure hypercholesterolemia   . Chest pain with moderate risk for cardiac etiology 01/11/2017  . Right shoulder pain 02/17/2013  . Migraine     Past Surgical History:  Procedure Laterality Date  . LEFT HEART CATH AND CORONARY ANGIOGRAPHY N/A 01/12/2017   Procedure: Left Heart Cath and Coronary Angiography;  Surgeon: Lyn Records, MD;  Location: [facility] INVASIVE CV LAB;  Service: Cardiovascular;  Laterality: N/A;  . TUBAL LIGATION       OB History   None      Home Medications    Prior to Admission medications   Medication Sig Start Date End Date Taking? Authorizing Provider  albuterol (PROVENTIL HFA;VENTOLIN HFA) 108 (90 Base) MCG/ACT inhaler Inhale 1-2 puffs into the lungs every 6 (six) hours as needed for wheezing or shortness of breath. 01/24/18   Maxwell Caul, PA-C  amLODipine (NORVASC) 5 MG tablet  Take 1 tablet (5 mg total) by mouth daily. 01/14/17   Rodolph Bong, MD  aspirin-acetaminophen-caffeine (EXCEDRIN MIGRAINE) [phone] MG per tablet Take 2 tablets by mouth every 6 (six) hours as needed for headache.    [provider]  atorvastatin (LIPITOR) 40 MG tablet Take 1 tablet (40 mg total) by mouth daily at 6 PM. 01/13/17   Rodolph Bong, MD  benzonatate (TESSALON) 100 MG capsule Take 1 capsule (100 mg total) by mouth every 8 (eight) hours. 01/24/18   Maxwell Caul, PA-C  chlorthalidone (HYGROTON) 25 MG tablet Take 1 tablet (25 mg total) by mouth daily. 01/14/17   Rodolph Bong, MD  fluticasone (FLONASE) 50 MCG/ACT nasal spray Place 1 spray into both nostrils daily. 01/24/18   Maxwell Caul, PA-C  hydrocortisone (ANUSOL-HC) 2.5 % rectal cream Apply rectally 2 times daily 01/31/18   Demetrios Loll T, PA-C  Multiple Vitamin (MULTIVITAMIN) capsule Take 1 capsule by mouth daily.    [provider]  nitroGLYCERIN (NITROSTAT) 0.4 MG SL tablet Place 1 tablet (0.4 mg total) under the tongue every 5 (five) minutes as needed for chest pain (CP or SOB). 01/13/17   Rodolph Bong, MD  pantoprazole (PROTONIX) 40 MG tablet Take 1 tablet (40 mg total) by mouth daily at 6 (six) AM. 01/14/17   Rodolph Bong, MD  Family History Family History  Problem Relation Age of Onset  . CAD Father 4350       MI/stents     Social History Social History   Tobacco Use  . Smoking status: Former Games developermoker  . Smokeless tobacco: Never Used  Substance Use Topics  . Alcohol use: Yes    Comment: occ  . Drug use: No     Allergies   Patient has no known allergies.   Review of Systems Review of Systems  Constitutional: Positive for chills and fever.  HENT: Positive for ear pain and sore throat. Negative for congestion and rhinorrhea.   Eyes: Positive for discharge and redness. Negative for pain and visual disturbance.  Respiratory: Positive for cough. Negative for  shortness of breath.   Gastrointestinal: Negative for abdominal pain and vomiting.  Musculoskeletal: Negative for neck pain and neck stiffness.  Neurological: Negative for headaches.  All other systems reviewed and are negative.    Physical Exam Updated Vital Signs BP (!) 144/99   Pulse 96   Temp 98.4 F (36.9 C)   Resp 16   Ht 5\' 4"  (1.626 m)   Wt (!) 165.6 kg (365 lb)   LMP 02/22/2018   SpO2 98%   BMI 62.65 kg/m   Physical Exam  Constitutional: She is oriented to person, place, and time. She appears well-developed and well-nourished.  obese  HENT:  Head: Normocephalic and atraumatic.  Right Ear: Tympanic membrane, external ear and ear canal normal.  Left Ear: Tympanic membrane, external ear and ear canal normal.  Nose: Nose normal.  Mouth/Throat: Uvula is midline. No trismus in the jaw. No uvula swelling. Posterior oropharyngeal erythema present. No oropharyngeal exudate, posterior oropharyngeal edema or tonsillar abscesses.  Eyes: Conjunctivae and EOM are normal. Right eye exhibits no discharge. Left eye exhibits no discharge.  Mild periorbital swelling around left eye but eye otherwise appears normal. No redness. Normal EOM without pain  Neck: Normal range of motion. Neck supple.  Cardiovascular: Normal rate, regular rhythm and normal heart sounds.  Pulmonary/Chest: Effort normal and breath sounds normal. No stridor. She has no wheezes. She has no rales.  Abdominal: Soft. There is no tenderness.  Neurological: She is alert and oriented to person, place, and time.  Skin: Skin is warm and dry.  Nursing note and vitals reviewed.    ED Treatments / Results  Labs (all labs ordered are listed, but only abnormal results are displayed) Labs Reviewed - No data to display  EKG None  Radiology No results found.  Procedures Procedures (including critical care time)  Medications Ordered in ED Medications - No data to display   Initial Impression / Assessment and  Plan / ED Course  I have reviewed the triage vital signs and the nursing notes.  Pertinent labs & imaging results that were available during my care of the patient were reviewed by me and considered in my medical decision making (see chart for details).     Patient symptoms are consistent with a viral upper respiratory infection.  We discussed over-the-counter supportive care but no indication for antibiotics.  Her left eye sound like it might have a conjunctivitis but at this point appears fairly normal with no injection, discharge, etc.  I have very low suspicion for an acute emergent condition or bacterial illness.  Her throat does not appear significantly abnormal and it is mildly red but this is probably expected with a viral URI.  No obvious abscess.  At this point I have  low suspicion of pneumonia as well she has no increased work of breathing, hypoxia, or dyspnea.  Do not think x-ray needed.  We discussed return precautions.  Final Clinical Impressions(s) / ED Diagnoses   Final diagnoses:  Viral upper respiratory tract infection with cough    ED Discharge Orders    None       Pricilla Loveless, MD 04/18/18 [phone]

## 2018-04-18 NOTE — Discharge Instructions (Addendum)
Return to the ER or see  your doctor if you develop high fever, vomiting, shortness of breath, or any other new/worsening symptoms.

## 2018-04-26 ENCOUNTER — Emergency Department (HOSPITAL_BASED_OUTPATIENT_CLINIC_OR_DEPARTMENT_OTHER)
Admission: EM | Admit: 2018-04-26 | Discharge: 2018-04-26 | Disposition: A | Discharge status: Home / Self Care | Payer: Self-pay | Attending: Emergency Medicine | Admitting: Emergency Medicine

## 2018-04-26 ENCOUNTER — Encounter (HOSPITAL_BASED_OUTPATIENT_CLINIC_OR_DEPARTMENT_OTHER): Payer: Self-pay | Admitting: *Deleted

## 2018-04-26 ENCOUNTER — Emergency Department (HOSPITAL_BASED_OUTPATIENT_CLINIC_OR_DEPARTMENT_OTHER): Payer: Self-pay

## 2018-04-26 ENCOUNTER — Other Ambulatory Visit: Payer: Self-pay

## 2018-04-26 DIAGNOSIS — I10 Essential (primary) hypertension: Secondary | ICD-10-CM | POA: Insufficient documentation

## 2018-04-26 DIAGNOSIS — J45909 Unspecified asthma, uncomplicated: Secondary | ICD-10-CM | POA: Insufficient documentation

## 2018-04-26 DIAGNOSIS — Z79899 Other long term (current) drug therapy: Secondary | ICD-10-CM | POA: Insufficient documentation

## 2018-04-26 DIAGNOSIS — Z87891 Personal history of nicotine dependence: Secondary | ICD-10-CM | POA: Insufficient documentation

## 2018-04-26 DIAGNOSIS — M25521 Pain in right elbow: Secondary | ICD-10-CM | POA: Insufficient documentation

## 2018-04-26 DIAGNOSIS — Z8673 Personal history of transient ischemic attack (TIA), and cerebral infarction without residual deficits: Secondary | ICD-10-CM | POA: Insufficient documentation

## 2018-04-26 LAB — PREGNANCY, URINE: Preg Test, Ur: NEGATIVE

## 2018-04-26 MED ORDER — NAPROXEN 500 MG PO TABS: 500.0000 mg | ORAL_TABLET | Freq: Two times a day (BID) | ORAL | 0 refills | Status: AC

## 2018-04-26 NOTE — ED Notes (Signed)
ED Provider at bedside. 

## 2018-04-26 NOTE — ED Provider Notes (Signed)
[facility] EMERGENCY DEPARTMENT Provider Note   CSN: [id number] Arrival date & time: 04/26/18  1625     History   Chief Complaint Chief Complaint  Patient presents with  . Arm Pain    HPI Terri Boyd is a 47 y.o. female.  HPI  Patient is a 47 yo female with a history of hypertension, hypercholesterolemia, DVT, TIA, and asthma presenting for right elbow pain.  Patient reports that it began today.  Patient denies any trauma or excessive use of the right upper extremity.  Patient feels that it is swollen just lateral lateral epicondyle of the right elbow, but there is no general swelling of the extremity.  Patient reports that she has had progressive difficulty flexing and extending the arm, but this is purely due to pain and she does not feel that the elbow is rigid.  No erythema, no diffuse pain of the elbow, fevers, chills, nausea, vomiting.  No history of diabetes or other immunocompromise status.  Past Medical History:  Diagnosis Date  . Asthma   . DVT (deep venous thrombosis) (HCC)   . High cholesterol   . Hypertension   . Migraine   . Obesity   . Stroke (HCC)   . TIA (transient ischemic attack)     Patient Active Problem List   Diagnosis Date Noted  . Morbid obesity (HCC)   . Essential hypertension   . Pure hypercholesterolemia   . Chest pain with moderate risk for cardiac etiology 01/11/2017  . Right shoulder pain 02/17/2013  . Migraine     Past Surgical History:  Procedure Laterality Date  . LEFT HEART CATH AND CORONARY ANGIOGRAPHY N/A 01/12/2017   Procedure: Left Heart Cath and Coronary Angiography;  Surgeon: Lyn RecordsHenry W Smith, MD;  [facility];  Service: Cardiovascular;  Laterality: N/A;  . TUBAL LIGATION       OB History   None      Home Medications    Prior to Admission medications   Medication Sig Start Date End Date Taking? Authorizing Provider  albuterol (PROVENTIL HFA;VENTOLIN HFA) 108 (90 Base) MCG/ACT inhaler  Inhale 1-2 puffs into the lungs every 6 (six) hours as needed for wheezing or shortness of breath. 01/24/18   Maxwell CaulLayden, Lindsey A, PA-C  amLODipine (NORVASC) 5 MG tablet Take 1 tablet (5 mg total) by mouth daily. 01/14/17   Rodolph Bonghompson, Daniel V, MD  aspirin-acetaminophen-caffeine (EXCEDRIN MIGRAINE) [phone] MG per tablet Take 2 tablets by mouth every 6 (six) hours as needed for headache.    [provider]  atorvastatin (LIPITOR) 40 MG tablet Take 1 tablet (40 mg total) by mouth daily at 6 PM. 01/13/17   Rodolph Bonghompson, Daniel V, MD  benzonatate (TESSALON) 100 MG capsule Take 1 capsule (100 mg total) by mouth every 8 (eight) hours. 01/24/18   Maxwell CaulLayden, Lindsey A, PA-C  chlorthalidone (HYGROTON) 25 MG tablet Take 1 tablet (25 mg total) by mouth daily. 01/14/17   Rodolph Bonghompson, Daniel V, MD  fluticasone (FLONASE) 50 MCG/ACT nasal spray Place 1 spray into both nostrils daily. 01/24/18   Maxwell CaulLayden, Lindsey A, PA-C  hydrocortisone (ANUSOL-HC) 2.5 % rectal cream Apply rectally 2 times daily 01/31/18   Demetrios LollLeaphart, Kenneth T, PA-C  Multiple Vitamin (MULTIVITAMIN) capsule Take 1 capsule by mouth daily.    [provider]  nitroGLYCERIN (NITROSTAT) 0.4 MG SL tablet Place 1 tablet (0.4 mg total) under the tongue every 5 (five) minutes as needed for chest pain (CP or SOB). 01/13/17   Janee Mornhompson,  Lovey Newcomeraniel V, MD  pantoprazole (PROTONIX) 40 MG tablet Take 1 tablet (40 mg total) by mouth daily at 6 (six) AM. 01/14/17   Rodolph Bonghompson, Daniel V, MD    Family History Family History  Problem Relation Age of Onset  . CAD Father 7850       MI/stents     Social History Social History   Tobacco Use  . Smoking status: Former Games developermoker  . Smokeless tobacco: Never Used  Substance Use Topics  . Alcohol use: Yes    Comment: occ  . Drug use: No     Allergies   Patient has no known allergies.   Review of Systems Review of Systems  Constitutional: Negative for chills and fever.  HENT: Negative for congestion and rhinorrhea.     Respiratory: Negative for shortness of breath.   Gastrointestinal: Negative for abdominal pain, nausea and vomiting.  Musculoskeletal: Positive for arthralgias.  Skin: Negative for color change.  Neurological: Negative for weakness and numbness.     Physical Exam Updated Vital Signs BP (!) 154/64 (BP Location: Left Arm)   Pulse 86   Temp 99.4 F (37.4 C) (Oral)   Resp 18   Ht 5\' 4"  (1.626 m)   Wt (!) 163.3 kg   SpO2 100%   BMI 61.79 kg/m   Physical Exam  Constitutional: She appears well-developed and well-nourished. No distress.  Sitting comfortably in bed.  HENT:  Head: Normocephalic and atraumatic.  Eyes: Conjunctivae are normal. Right eye exhibits no discharge. Left eye exhibits no discharge.  EOMs normal to gross examination.  Neck: Normal range of motion.  Cardiovascular: Normal rate and regular rhythm.  Intact, 2+ radial pulse or RUE.   Pulmonary/Chest:  Normal respiratory effort. Patient converses comfortably. No audible wheeze or stridor.  Abdominal: She exhibits no distension.  Musculoskeletal:  Right elbow exam:  No erythema or swelling noted to the right elbow.  Patient has tenderness to palpation over the lateral epicondyle of the right upper extremity.  Patient has no focal olecranon, supracondylar, or medial epicondyles tenderness. Patient is able to perform passive range of motion of the right elbow, as well as active range of motion.  Patient has pain with resisted extension of right forearm.   Neurological: She is alert.  Cranial nerves intact to gross observation. Patient moves extremities without difficulty.  Skin: Skin is warm and dry. She is not diaphoretic.  Psychiatric: She has a normal mood and affect. Her behavior is normal. Judgment and thought content normal.  Nursing note and vitals reviewed.    ED Treatments / Results  Labs (all labs ordered are listed, but only abnormal results are displayed) Labs Reviewed  PREGNANCY, URINE     EKG None  Radiology Dg Elbow Complete Right  Result Date: 04/26/2018 CLINICAL DATA:  Pain with movement EXAM: RIGHT ELBOW - COMPLETE 3+ VIEW COMPARISON:  None. FINDINGS: Frontal, lateral, and bilateral oblique views were obtained. There is mild soft tissue swelling. No evident fracture or dislocation. No appreciable joint effusion. There is a spur arising from the coracoid process of the proximal ulna. There is no appreciable joint space narrowing or erosion. There is a small calcification lateral to the distal lateral humeral condyle. IMPRESSION: Spur arising from the coracoid process of the proximal ulna. No appreciable joint space narrowing or erosion. No fracture or dislocation. Mild soft tissue swelling noted. Small calcification lateral to the distal left humeral condyle. Suspect tendinosis. Electronically Signed   By: Bretta BangWilliam  Woodruff III M.D.  On: 04/26/2018 18:04    Procedures Procedures (including critical care time)  Medications Ordered in ED Medications - No data to display   Initial Impression / Assessment and Plan / ED Course  I have reviewed the triage vital signs and the nursing notes.  Pertinent labs & imaging results that were available during my care of the patient were reviewed by me and considered in my medical decision making (see chart for details).  Clinical Course as of Apr 26 1912  Tue Apr 26, 2018  6211 47 year old female complaining of acute onset of right elbow pain when she woke up this morning.  She cannot think of anything specific that she has been doing over the last few days that would have caused it.  She is never had this before.  On exam she is got full range of motion although is painful for her.  She is got point tenderness right over her lateral epicondyle.  This is likely mostly inflammatory may be from some overuse or maybe she struck it.  She is getting an x-ray and ultimately I think the plan would be for conservative treatment.   [MB]     Clinical Course User Index [MB] Terrilee Files, MD    Patient is nontoxic-appearing, afebrile, and in no acute distress.  Doubt septic arthritis, as patient has no pain out of proportion with passive range of motion, has no general swelling of the joint or erythema, and has pain focally over the lateral epicondyle.  Doubt DVT, as there is no generalized soft tissue swelling of the right upper extremity.  Doubt cellulitis, as there is no erythema or induration.  X-ray showing no fracture, patient does have spur and possible calcinosis of tendon in the elbow.  We will treat for lateral epicondylitis with rest, ice, compression, elevation, as well as short term NSAID therapy.  Patient is not taking any concomitant anticoagulation.  Return precautions were given for any worsening pain, inability to range the elbow, redness or increasing swelling, or fever chills.  Patient is in understanding and agrees with the plan of care.  Final Clinical Impressions(s) / ED Diagnoses   Final diagnoses:  Right elbow pain  Elevated blood pressure reading in office with diagnosis of hypertension    ED Discharge Orders         Ordered    naproxen (NAPROSYN) 500 MG tablet  2 times daily     04/26/18 1920           Delia Chimes 04/26/18 1920    Terrilee Files, MD 04/27/18 [phone]

## 2018-04-26 NOTE — ED Notes (Signed)
Patient transported to X-ray 

## 2018-04-26 NOTE — Discharge Instructions (Signed)
Please see the information and instructions below regarding your visit.  Your diagnoses today include:  1. Right elbow pain   2. Elevated blood pressure reading in office with diagnosis of hypertension     Tests performed today include: See side panel of your discharge paperwork for testing performed today. Vital signs are listed at the bottom of these instructions.   Medications prescribed:    Take any prescribed medications only as prescribed, and any over the counter medications only as directed on the packaging.  You are prescribed naproxen, a non-steroidal anti-inflammatory agent (NSAID) for pain. You may take 500mg  every 12 hours as needed for pain. If still requiring this medication around the clock for acute pain after 10 days, please see your primary healthcare provider.  Women who are pregnant, breastfeeding, or planning on becoming pregnant should not take non-steroidal anti-inflammatories such as Advil and Aleve. Tylenol is a safe over the counter pain reliever in pregnant women.  You may combine this medication with Tylenol, 650 mg every 6 hours, so you are receiving something for pain every 3 hours.  This is not a long-term medication unless under the care and direction of your primary provider. Taking this medication long-term and not under the supervision of a healthcare provider could increase the risk of stomach ulcers, kidney problems, and cardiovascular problems such as high blood pressure.    Home care instructions:  Please follow any educational materials contained in this packet.   Please keep the elbow compressed in the Ace wrap that we gave you today.  You may apply ice over top of the Ace wrap.   Please do not perform any motions where you are extending her wrist such as lifting of groceries, or lifting heavy boxes.  Follow-up instructions:  Please follow up with Dr. Pearletha ForgeHudnall in one week for your elbow.  Return instructions:  Please return to the Emergency  Department if you experience worsening symptoms.  Please return to the emergency department if you develop any worsening pain, inability to perform range of motion of the elbow, increasing swelling, or redness of the elbow. Please return for any fever or chills with your elbow pain. Please return if you have any other emergent concerns.  Additional Information:   Your vital signs today were: BP (!) 154/64 (BP Location: Left Arm)    Pulse 86    Temp 99.4 F (37.4 C) (Oral)    Resp 18    Ht 5\' 4"  (1.626 m)    Wt (!) 163.3 kg    SpO2 100%    BMI 61.79 kg/m  If your blood pressure (BP) was elevated on multiple readings during this visit above 130 for the top number or above 80 for the bottom number, please have this repeated by your primary care provider within one month. --------------  Thank you for allowing us to participate in your care today.

## 2018-04-26 NOTE — ED Triage Notes (Signed)
Right elbow pain this am with no injury.

## 2018-05-02 ENCOUNTER — Ambulatory Visit: Payer: Medicaid Other | Admitting: Family Medicine

## 2018-05-05 ENCOUNTER — Encounter: Payer: Self-pay | Admitting: Family Medicine

## 2018-05-05 ENCOUNTER — Ambulatory Visit (INDEPENDENT_AMBULATORY_CARE_PROVIDER_SITE_OTHER): Payer: Self-pay | Admitting: Family Medicine

## 2018-05-05 VITALS — BP 180/97 | HR 90 | Ht 64.0 in | Wt 366.2 lb

## 2018-05-05 DIAGNOSIS — M7711 Lateral epicondylitis, right elbow: Secondary | ICD-10-CM

## 2018-05-05 NOTE — Progress Notes (Signed)
PCP: [name], MD  Subjective:   HPI: Patient is a 47 y.o. female here for right elbow pain.  Patient reports she woke up with pain on August 13 and the lateral aspect of her right elbow. She denies any acute injury or trauma. She also denies any increase in her activity level in the day leading up to this. No recent illness, fevers, chills, sweats. Pain worsened through that day and was really bad by lunchtime. Pain is improved today down to 1/10 and a soreness. She has been using ice and heat as well as tried an Ace wrap. She has tried both ibuprofen and Aleve with mild benefit. No radiation, no skin changes or numbness.  Past Medical History:  Diagnosis Date  . Asthma   . DVT (deep venous thrombosis) (HCC)   . High cholesterol   . Hypertension   . Migraine   . Obesity   . Stroke (HCC)   . TIA (transient ischemic attack)     Current Outpatient Medications on File Prior to Visit  Medication Sig Dispense Refill  . albuterol (PROVENTIL HFA;VENTOLIN HFA) 108 (90 Base) MCG/ACT inhaler Inhale 1-2 puffs into the lungs every 6 (six) hours as needed for wheezing or shortness of breath. 1 Inhaler 0  . amLODipine (NORVASC) 5 MG tablet Take 1 tablet (5 mg total) by mouth daily. 30 tablet 1  . aspirin-acetaminophen-caffeine (EXCEDRIN MIGRAINE) 250-250-65 MG per tablet Take 2 tablets by mouth every 6 (six) hours as needed for headache.    Marland Kitchen atorvastatin (LIPITOR) 40 MG tablet Take 1 tablet (40 mg total) by mouth daily at 6 PM. 30 tablet 1  . chlorthalidone (HYGROTON) 25 MG tablet Take 1 tablet (25 mg total) by mouth daily. 30 tablet 1  . fluticasone (FLONASE) 50 MCG/ACT nasal spray Place 1 spray into both nostrils daily. 16 g 2  . hydrocortisone (ANUSOL-HC) 2.5 % rectal cream Apply rectally 2 times daily 28.35 g 0  . Multiple Vitamin (MULTIVITAMIN) capsule Take 1 capsule by mouth daily.    . nitroGLYCERIN (NITROSTAT) 0.4 MG SL tablet Place 1 tablet (0.4 mg total) under the tongue  every 5 (five) minutes as needed for chest pain (CP or SOB). 10 tablet 0  . pantoprazole (PROTONIX) 40 MG tablet Take 1 tablet (40 mg total) by mouth daily at 6 (six) AM. 30 tablet 1   No current facility-administered medications on file prior to visit.     Past Surgical History:  Procedure Laterality Date  . LEFT HEART CATH AND CORONARY ANGIOGRAPHY N/A 01/12/2017   Procedure: Left Heart Cath and Coronary Angiography;  Surgeon: Lyn Records, MD;  Location: [facility] INVASIVE CV LAB;  Service: Cardiovascular;  Laterality: N/A;  . TUBAL LIGATION      No Known Allergies  Social History   Socioeconomic History  . Marital status: Single    Spouse name: Not on file  . Number of children: Not on file  . Years of education: Not on file  . Highest education level: Not on file  Occupational History  . Not on file  Social Needs  . Financial resource strain: Not on file  . Food insecurity:    Worry: Not on file    Inability: Not on file  . Transportation needs:    Medical: Not on file    Non-medical: Not on file  Tobacco Use  . Smoking status: Former Games developer  . Smokeless tobacco: Never Used  Substance and Sexual Activity  . Alcohol use: Yes  Comment: occ  . Drug use: No  . Sexual activity: Not on file  Lifestyle  . Physical activity:    Days per week: Not on file    Minutes per session: Not on file  . Stress: Not on file  Relationships  . Social connections:    Talks on phone: Not on file    Gets together: Not on file    Attends religious service: Not on file    Active member of club or organization: Not on file    Attends meetings of clubs or organizations: Not on file    Relationship status: Not on file  . Intimate partner violence:    Fear of current or ex partner: Not on file    Emotionally abused: Not on file    Physically abused: Not on file    Forced sexual activity: Not on file  Other Topics Concern  . Not on file  Social History Narrative  . Not on file     Family History  Problem Relation Age of Onset  . CAD Father 6450       MI/stents     BP (!) 180/97   Pulse 90   Ht 5\' 4"  (1.626 m)   Wt (!) 366 lb 3.2 oz (166.1 kg)   BMI 62.86 kg/m   Review of Systems: See HPI above.     Objective:  Physical Exam:  Gen: NAD, comfortable in exam room  Right elbow: No deformity, swelling, bruising. FROM with 5/5 strength but pain on wrist extension, 3rd digit extension.  No pain supination or pronation. Mild tenderness to palpation lateral epicondyle and just distal to this. NVI distally. Collateral ligaments intact.  Left elbow: No deformity. FROM with 5/5 strength. No tenderness to palpation. NVI distally.   Assessment & Plan:  1. Right lateral epicondylitis -reviewed home exercises and stretches for her to do daily.  Icing, ibuprofen or Aleve as needed after 7 to 10 days.  She was fitted with an elbow sleeve which helped with her pain immediately.  She will consider physical therapy or nitro patches if not improving.  Follow-up in 6 weeks.

## 2018-05-05 NOTE — Patient Instructions (Addendum)
You have lateral epicondylitis Try to avoid painful activities as much as possible. Ice the area 3-4 times a day for 15 minutes at a time (heat is ok at this point if this feels better). Ibuprofen 600mg  three times a day with food OR aleve 2 tabs twice a day with food - take for 7-10 days then as needed. Counterforce brace or sleeve as directed can help unload area - wear this regularly if it provides you with relief. Hammer rotation exercise, wrist extension exercise with 1 pound weight - 3 sets of 10 once a day.   Stretching - hold for 20-30 seconds and repeat 3 times. Consider physical therapy, nitro patches if not improving. Follow up in 6 weeks.

## 2018-06-16 ENCOUNTER — Encounter: Payer: Self-pay | Admitting: Family Medicine

## 2018-06-16 ENCOUNTER — Ambulatory Visit (INDEPENDENT_AMBULATORY_CARE_PROVIDER_SITE_OTHER): Payer: Self-pay | Admitting: Family Medicine

## 2018-06-16 VITALS — BP 143/83 | HR 87 | Ht 64.0 in | Wt 359.0 lb

## 2018-06-16 DIAGNOSIS — M7711 Lateral epicondylitis, right elbow: Secondary | ICD-10-CM

## 2018-06-16 NOTE — Progress Notes (Signed)
PCP: [name], MD  Subjective:   HPI: Patient is a 47 y.o. female here for right elbow pain.  8/22: Patient reports she woke up with pain on August 13 and the lateral aspect of her right elbow. She denies any acute injury or trauma. She also denies any increase in her activity level in the day leading up to this. No recent illness, fevers, chills, sweats. Pain worsened through that day and was really bad by lunchtime. Pain is improved today down to 1/10 and a soreness. She has been using ice and heat as well as tried an Ace wrap. She has tried both ibuprofen and Aleve with mild benefit. No radiation, no skin changes or numbness.  10/3: Patient reports she's doing well. No pain. Hasn't needed anything for about 2 weeks now. Took ibuprofen initially, wore brace, did home exercises. No skin changes.  Past Medical History:  Diagnosis Date  . Asthma   . DVT (deep venous thrombosis) (HCC)   . High cholesterol   . Hypertension   . Migraine   . Obesity   . Stroke (HCC)   . TIA (transient ischemic attack)     Current Outpatient Medications on File Prior to Visit  Medication Sig Dispense Refill  . budesonide-formoterol (SYMBICORT) 160-4.5 MCG/ACT inhaler Inhale into the lungs.    Marland Kitchen albuterol (PROVENTIL HFA;VENTOLIN HFA) 108 (90 Base) MCG/ACT inhaler Inhale 1-2 puffs into the lungs every 6 (six) hours as needed for wheezing or shortness of breath. 1 Inhaler 0  . amLODipine (NORVASC) 5 MG tablet Take 1 tablet (5 mg total) by mouth daily. 30 tablet 1  . aspirin-acetaminophen-caffeine (EXCEDRIN MIGRAINE) 250-250-65 MG per tablet Take 2 tablets by mouth every 6 (six) hours as needed for headache.    Marland Kitchen atorvastatin (LIPITOR) 40 MG tablet Take 1 tablet (40 mg total) by mouth daily at 6 PM. 30 tablet 1  . chlorthalidone (HYGROTON) 25 MG tablet Take 1 tablet (25 mg total) by mouth daily. 30 tablet 1  . fluticasone (FLONASE) 50 MCG/ACT nasal spray Place 1 spray into both nostrils  daily. 16 g 2  . hydrocortisone (ANUSOL-HC) 2.5 % rectal cream Apply rectally 2 times daily 28.35 g 0  . Multiple Vitamin (MULTIVITAMIN) capsule Take 1 capsule by mouth daily.    . nitroGLYCERIN (NITROSTAT) 0.4 MG SL tablet Place 1 tablet (0.4 mg total) under the tongue every 5 (five) minutes as needed for chest pain (CP or SOB). 10 tablet 0  . pantoprazole (PROTONIX) 40 MG tablet Take 1 tablet (40 mg total) by mouth daily at 6 (six) AM. 30 tablet 1   No current facility-administered medications on file prior to visit.     Past Surgical History:  Procedure Laterality Date  . LEFT HEART CATH AND CORONARY ANGIOGRAPHY N/A 01/12/2017   Procedure: Left Heart Cath and Coronary Angiography;  Surgeon: Lyn Records, MD;  Location: [facility] INVASIVE CV LAB;  Service: Cardiovascular;  Laterality: N/A;  . TUBAL LIGATION      No Known Allergies  Social History   Socioeconomic History  . Marital status: Single    Spouse name: Not on file  . Number of children: Not on file  . Years of education: Not on file  . Highest education level: Not on file  Occupational History  . Not on file  Social Needs  . Financial resource strain: Not on file  . Food insecurity:    Worry: Not on file    Inability: Not on file  .  Transportation needs:    Medical: Not on file    Non-medical: Not on file  Tobacco Use  . Smoking status: Former Games developer  . Smokeless tobacco: Never Used  Substance and Sexual Activity  . Alcohol use: Yes    Comment: occ  . Drug use: No  . Sexual activity: Not on file  Lifestyle  . Physical activity:    Days per week: Not on file    Minutes per session: Not on file  . Stress: Not on file  Relationships  . Social connections:    Talks on phone: Not on file    Gets together: Not on file    Attends religious service: Not on file    Active member of club or organization: Not on file    Attends meetings of clubs or organizations: Not on file    Relationship status: Not on file  .  Intimate partner violence:    Fear of current or ex partner: Not on file    Emotionally abused: Not on file    Physically abused: Not on file    Forced sexual activity: Not on file  Other Topics Concern  . Not on file  Social History Narrative  . Not on file    Family History  Problem Relation Age of Onset  . CAD Father 46       MI/stents     BP (!) 143/83   Pulse 87   Ht 5\' 4"  (1.626 m)   Wt (!) 359 lb (162.8 kg)   BMI 61.62 kg/m   Review of Systems: See HPI above.     Objective:  Physical Exam:  Gen: NAD, comfortable in exam room  Right elbow: No deformity. FROM with 5/5 strength.  No pain resisted wrist extension, 3rd digit extension, supination. No tenderness to palpation. NVI distally. Collateral ligaments intact.  Assessment & Plan:  1. Right lateral epicondylitis - resolved.  F/u prn.

## 2018-12-16 ENCOUNTER — Encounter (HOSPITAL_BASED_OUTPATIENT_CLINIC_OR_DEPARTMENT_OTHER): Payer: Self-pay | Admitting: *Deleted

## 2018-12-16 ENCOUNTER — Other Ambulatory Visit: Payer: Self-pay

## 2018-12-16 ENCOUNTER — Emergency Department (HOSPITAL_BASED_OUTPATIENT_CLINIC_OR_DEPARTMENT_OTHER)
Admission: EM | Admit: 2018-12-16 | Discharge: 2018-12-16 | Disposition: A | Discharge status: Home / Self Care | Payer: Self-pay | Attending: Emergency Medicine | Admitting: Emergency Medicine

## 2018-12-16 ENCOUNTER — Ambulatory Visit (HOSPITAL_BASED_OUTPATIENT_CLINIC_OR_DEPARTMENT_OTHER)
Admit: 2018-12-16 | Discharge: 2018-12-16 | Disposition: A | Discharge status: Home / Self Care | Payer: Self-pay | Attending: Emergency Medicine | Admitting: Emergency Medicine

## 2018-12-16 ENCOUNTER — Other Ambulatory Visit (HOSPITAL_BASED_OUTPATIENT_CLINIC_OR_DEPARTMENT_OTHER): Payer: Self-pay | Admitting: Emergency Medicine

## 2018-12-16 ENCOUNTER — Emergency Department (HOSPITAL_BASED_OUTPATIENT_CLINIC_OR_DEPARTMENT_OTHER): Payer: Self-pay

## 2018-12-16 DIAGNOSIS — Z86718 Personal history of other venous thrombosis and embolism: Secondary | ICD-10-CM

## 2018-12-16 DIAGNOSIS — O2230 Deep phlebothrombosis in pregnancy, unspecified trimester: Secondary | ICD-10-CM

## 2018-12-16 DIAGNOSIS — Y939 Activity, unspecified: Secondary | ICD-10-CM | POA: Insufficient documentation

## 2018-12-16 DIAGNOSIS — W010XXA Fall on same level from slipping, tripping and stumbling without subsequent striking against object, initial encounter: Secondary | ICD-10-CM | POA: Insufficient documentation

## 2018-12-16 DIAGNOSIS — J45909 Unspecified asthma, uncomplicated: Secondary | ICD-10-CM | POA: Insufficient documentation

## 2018-12-16 DIAGNOSIS — Y999 Unspecified external cause status: Secondary | ICD-10-CM | POA: Insufficient documentation

## 2018-12-16 DIAGNOSIS — M79661 Pain in right lower leg: Secondary | ICD-10-CM

## 2018-12-16 DIAGNOSIS — Z79899 Other long term (current) drug therapy: Secondary | ICD-10-CM | POA: Insufficient documentation

## 2018-12-16 DIAGNOSIS — M7989 Other specified soft tissue disorders: Principal | ICD-10-CM

## 2018-12-16 DIAGNOSIS — S8011XA Contusion of right lower leg, initial encounter: Secondary | ICD-10-CM | POA: Insufficient documentation

## 2018-12-16 DIAGNOSIS — Y929 Unspecified place or not applicable: Secondary | ICD-10-CM | POA: Insufficient documentation

## 2018-12-16 DIAGNOSIS — I10 Essential (primary) hypertension: Secondary | ICD-10-CM | POA: Insufficient documentation

## 2018-12-16 LAB — BASIC METABOLIC PANEL
Anion gap: 7 (ref 5–15)
BUN: 20 mg/dL (ref 6–20)
CO2: 23 mmol/L (ref 22–32)
Calcium: 8.9 mg/dL (ref 8.9–10.3)
Chloride: 106 mmol/L (ref 98–111)
Creatinine, Ser: 0.83 mg/dL (ref 0.44–1.00)
GFR calc Af Amer: >60 mL/min (ref >60)
GFR calc non Af Amer: >60 mL/min (ref >60)
Glucose, Bld: 126 mg/dL — ABNORMAL HIGH (ref 70–99)
Potassium: 3.6 mmol/L (ref 3.5–5.1)
Sodium: 136 mmol/L (ref 135–145)

## 2018-12-16 LAB — CBC
HCT: 34.1 % — ABNORMAL LOW (ref 36.0–46.0)
Hemoglobin: 10.4 g/dL — ABNORMAL LOW (ref 12.0–15.0)
MCH: 24.7 pg — ABNORMAL LOW (ref 26.0–34.0)
MCHC: 30.5 g/dL (ref 30.0–36.0)
MCV: 81 fL (ref 80.0–100.0)
Platelets: 313 10*3/uL (ref 150–400)
RBC: 4.21 MIL/uL (ref 3.87–5.11)
RDW: 16.8 % — ABNORMAL HIGH (ref 11.5–15.5)
WBC: 7.7 10*3/uL (ref 4.0–10.5)
nRBC: 0 % (ref 0.0–0.2)

## 2018-12-16 LAB — D-DIMER, QUANTITATIVE: D-Dimer, Quant: 0.77 ug/mL-FEU — ABNORMAL HIGH (ref 0.00–0.50)

## 2018-12-16 MED ORDER — ENOXAPARIN SODIUM 150 MG/ML ~~LOC~~ SOLN
150.0000 mg | Freq: Once | SUBCUTANEOUS | Status: AC
  Administered 2018-12-16: 150 mg via SUBCUTANEOUS
  Filled 2018-12-16: qty 1

## 2018-12-16 NOTE — ED Notes (Signed)
PT states understanding of care given, follow up care, and medication prescribed. PT ambulated from ED to car with a steady gait. 

## 2018-12-16 NOTE — ED Provider Notes (Signed)
[facility] EMERGENCY DEPARTMENT Provider Note   CSN: [id number] Arrival date & time: 12/16/18  0015    History   Chief Complaint Chief Complaint  Patient presents with  . Leg Pain    HPI Terri Boyd is a 48 y.o. female.     Patient presents to the emergency department for evaluation of pain and swelling of her right lower leg.  Patient reports that she was putting together a bed frame two weeks ago when she accidentally hit her shin on the bed frame causing her to fall.  Since then she has had pain and swelling and this seems to be getting worse rather than better.  Patient does have a history of DVT.  She is not on anticoagulation.  No chest pain, palpitations, shortness of breath.     Past Medical History:  Diagnosis Date  . Asthma   . DVT (deep venous thrombosis) (HCC)   . High cholesterol   . Hypertension   . Migraine   . Obesity   . Stroke (HCC)   . TIA (transient ischemic attack)     Patient Active Problem List   Diagnosis Date Noted  . Morbid obesity (HCC)   . Essential hypertension   . Pure hypercholesterolemia   . Chest pain with moderate risk for cardiac etiology 01/11/2017  . Right shoulder pain 02/17/2013  . Migraine     Past Surgical History:  Procedure Laterality Date  . LEFT HEART CATH AND CORONARY ANGIOGRAPHY N/A 01/12/2017   Procedure: Left Heart Cath and Coronary Angiography;  Surgeon: Lyn Records, MD;  Location: [facility] INVASIVE CV LAB;  Service: Cardiovascular;  Laterality: N/A;  . TUBAL LIGATION       OB History   No obstetric history on file.      Home Medications    Prior to Admission medications   Medication Sig Start Date End Date Taking? Authorizing Provider  albuterol (PROVENTIL HFA;VENTOLIN HFA) 108 (90 Base) MCG/ACT inhaler Inhale 1-2 puffs into the lungs every 6 (six) hours as needed for wheezing or shortness of breath. 01/24/18   Maxwell Caul, PA-C  amLODipine (NORVASC) 5 MG tablet Take 1 tablet (5 mg total)  by mouth daily. 01/14/17   Rodolph Bong, MD  aspirin-acetaminophen-caffeine (EXCEDRIN MIGRAINE) [phone] MG per tablet Take 2 tablets by mouth every 6 (six) hours as needed for headache.    [provider]  atorvastatin (LIPITOR) 40 MG tablet Take 1 tablet (40 mg total) by mouth daily at 6 PM. 01/13/17   Rodolph Bong, MD  budesonide-formoterol [facility]) 160-4.5 MCG/ACT inhaler Inhale into the lungs. 06/14/18   [provider]  chlorthalidone (HYGROTON) 25 MG tablet Take 1 tablet (25 mg total) by mouth daily. 01/14/17   Rodolph Bong, MD  fluticasone (FLONASE) 50 MCG/ACT nasal spray Place 1 spray into both nostrils daily. 01/24/18   Maxwell Caul, PA-C  hydrocortisone (ANUSOL-HC) 2.5 % rectal cream Apply rectally 2 times daily 01/31/18   Demetrios Loll T, PA-C  Multiple Vitamin (MULTIVITAMIN) capsule Take 1 capsule by mouth daily.    [provider]  nitroGLYCERIN (NITROSTAT) 0.4 MG SL tablet Place 1 tablet (0.4 mg total) under the tongue every 5 (five) minutes as needed for chest pain (CP or SOB). 01/13/17   Rodolph Bong, MD  pantoprazole (PROTONIX) 40 MG tablet Take 1 tablet (40 mg total) by mouth daily at 6 (six) AM. 01/14/17   Rodolph Bong, MD    Family History  Family History  Problem Relation Age of Onset  . CAD Father 77       MI/stents     Social History Social History   Tobacco Use  . Smoking status: Former Games developer  . Smokeless tobacco: Never Used  Substance Use Topics  . Alcohol use: Yes    Comment: occ  . Drug use: No     Allergies   Patient has no known allergies.   Review of Systems Review of Systems  Musculoskeletal:       Leg pain  Skin: Negative for wound.     Physical Exam Updated Vital Signs BP (!) 157/79 (BP Location: Right Arm)   Pulse 96   Temp 98.1 F (36.7 C) (Oral)   Resp 18   Ht 5\' 4"  (1.626 m)   Wt (!) 163.3 kg   SpO2 100%   BMI 61.79 kg/m   Physical Exam Vitals signs and nursing  note reviewed.  Constitutional:      General: She is not in acute distress.    Appearance: Normal appearance. She is well-developed.  HENT:     Head: Normocephalic and atraumatic.     Right Ear: Hearing normal.     Left Ear: Hearing normal.     Nose: Nose normal.  Eyes:     Conjunctiva/sclera: Conjunctivae normal.     Pupils: Pupils are equal, round, and reactive to light.  Neck:     Musculoskeletal: Normal range of motion and neck supple.  Cardiovascular:     Rate and Rhythm: Regular rhythm.     Heart sounds: S1 normal and S2 normal. No murmur. No friction rub. No gallop.   Pulmonary:     Effort: Pulmonary effort is normal. No respiratory distress.     Breath sounds: Normal breath sounds.  Chest:     Chest wall: No tenderness.  Abdominal:     General: Bowel sounds are normal.     Palpations: Abdomen is soft.     Tenderness: There is no abdominal tenderness. There is no guarding or rebound. Negative signs include Murphy's sign and McBurney's sign.     Hernia: No hernia is present.  Musculoskeletal: Normal range of motion.     Right lower leg: She exhibits tenderness and swelling.       Legs:     Comments: Tenderness and swelling distal portion of right lower leg.  No overlying wounds, no overlying erythema or warmth  No calf tenderness, no calf swelling  Skin:    General: Skin is warm and dry.     Findings: No rash.  Neurological:     Mental Status: She is alert and oriented to person, place, and time.     GCS: GCS eye subscore is 4. GCS verbal subscore is 5. GCS motor subscore is 6.     Cranial Nerves: No cranial nerve deficit.     Sensory: No sensory deficit.     Coordination: Coordination normal.  Psychiatric:        Speech: Speech normal.        Behavior: Behavior normal.        Thought Content: Thought content normal.      ED Treatments / Results  Labs (all labs ordered are listed, but only abnormal results are displayed) Labs Reviewed  CBC - Abnormal;  Notable for the following components:      Result Value   Hemoglobin 10.4 (*)    HCT 34.1 (*)    MCH 24.7 (*)  RDW 16.8 (*)    All other components within normal limits  BASIC METABOLIC PANEL - Abnormal; Notable for the following components:   Glucose, Bld 126 (*)    All other components within normal limits  D-DIMER, QUANTITATIVE (NOT AT [facility]) - Abnormal; Notable for the following components:   D-Dimer, Quant 0.77 (*)    All other components within normal limits    EKG None  Radiology Dg Tibia/fibula Right  Result Date: 12/16/2018 CLINICAL DATA:  Larey Seat 2 weeks ago, persistent pain. EXAM: RIGHT TIBIA AND FIBULA - 2 VIEW COMPARISON:  None. FINDINGS: There is no evidence of fracture or other focal bone lesions. Small plantar calcaneal spur. Mild medial and patellofemoral compartment osteoarthrosis. Soft tissue planes are non suspicious. Large body habitus. IMPRESSION: No acute osseous process. Electronically Signed   By: Awilda Metro M.D.   On: 12/16/2018 00:52    Procedures Procedures (including critical care time)  Medications Ordered in ED Medications  enoxaparin (LOVENOX) injection 150 mg (has no administration in time range)     Initial Impression / Assessment and Plan / ED Course  I have reviewed the triage vital signs and the nursing notes.  Pertinent labs & imaging results that were available during my care of the patient were reviewed by me and considered in my medical decision making (see chart for details).        Patient presents to the emergency department for evaluation of leg pain.  Patient injured the lower leg 2 weeks ago when she fell.  The area has had persistent tenderness and swelling.  There is no overlying erythema, warmth or signs of cellulitis or infection.  Patient does have a history of DVT.  Swelling and tenderness appears to be isolated to the anterior lower leg, unlikely to be DVT, but with her history d-dimer was sent.  It is elevated.  She  will be sent out after administration of Lovenox, return tomorrow for official DVT study.  This is likely all contusion and swelling secondary to body habitus and inability to rest and locate the area.  If DVT is negative, conservative treatment for contusion.  Final Clinical Impressions(s) / ED Diagnoses   Final diagnoses:  Contusion of right lower leg, initial encounter    ED Discharge Orders         Ordered    US Venous Img Lower Unilateral Right     12/16/18 0123           Gilda Crease, MD 12/16/18 [phone]

## 2018-12-16 NOTE — ED Triage Notes (Signed)
Pt reports falling 2 weeks ago. States right lower leg pain progressively worsening. Increased pain with flexion and extension and palpation.  Notable swelling.

## 2019-03-20 ENCOUNTER — Other Ambulatory Visit: Payer: Self-pay

## 2019-03-20 ENCOUNTER — Emergency Department (HOSPITAL_BASED_OUTPATIENT_CLINIC_OR_DEPARTMENT_OTHER)
Admission: EM | Admit: 2019-03-20 | Discharge: 2019-03-21 | Disposition: A | Discharge status: Home / Self Care | Payer: Medicaid Other | Attending: Emergency Medicine | Admitting: Emergency Medicine

## 2019-03-20 ENCOUNTER — Encounter (HOSPITAL_BASED_OUTPATIENT_CLINIC_OR_DEPARTMENT_OTHER): Payer: Self-pay

## 2019-03-20 DIAGNOSIS — J45909 Unspecified asthma, uncomplicated: Secondary | ICD-10-CM | POA: Insufficient documentation

## 2019-03-20 DIAGNOSIS — Z79899 Other long term (current) drug therapy: Secondary | ICD-10-CM | POA: Insufficient documentation

## 2019-03-20 DIAGNOSIS — R202 Paresthesia of skin: Secondary | ICD-10-CM

## 2019-03-20 DIAGNOSIS — A599 Trichomoniasis, unspecified: Secondary | ICD-10-CM | POA: Insufficient documentation

## 2019-03-20 DIAGNOSIS — I10 Essential (primary) hypertension: Secondary | ICD-10-CM | POA: Insufficient documentation

## 2019-03-20 DIAGNOSIS — Z87891 Personal history of nicotine dependence: Secondary | ICD-10-CM | POA: Insufficient documentation

## 2019-03-20 LAB — URINALYSIS, ROUTINE W REFLEX MICROSCOPIC
Bilirubin Urine: NEGATIVE
Glucose, UA: NEGATIVE mg/dL
Ketones, ur: NEGATIVE mg/dL
Leukocytes,Ua: NEGATIVE
Nitrite: NEGATIVE
Protein, ur: NEGATIVE mg/dL
Specific Gravity, Urine: >1.03 — ABNORMAL HIGH (ref 1.005–1.030)
pH: 6 (ref 5.0–8.0)

## 2019-03-20 LAB — URINALYSIS, MICROSCOPIC (REFLEX)

## 2019-03-20 LAB — PREGNANCY, URINE: Preg Test, Ur: NEGATIVE

## 2019-03-20 MED ORDER — METRONIDAZOLE 500 MG PO TABS
2000.0000 mg | ORAL_TABLET | Freq: Once | ORAL | Status: AC
  Administered 2019-03-20: 2000 mg via ORAL
  Filled 2019-03-20: qty 4

## 2019-03-20 NOTE — ED Triage Notes (Signed)
Pt c/o cont'd pain to right LE from injury in [place] c/o fatigue and urinary freq-pt with DOe which she is r/t to wearing face mask-steady gait

## 2019-03-20 NOTE — ED Provider Notes (Signed)
[facility] EMERGENCY DEPARTMENT Provider Note  CSN: [id number] Arrival date & time: 03/20/19 2148  Chief Complaint(s) Leg Pain  HPI Terri Boyd is a 48 y.o. female with a past medical history listed below including DVT and stroke who presents to the emergency department with right leg paresthesias.  She reports that these have been ongoing going and intermittent for several months.  They began after she hit her leg on a coffee table.  She reports the paresthesias as a tingling sensation which occurs approximately 2 times a day.  She endorses some intermittent achiness at night.  Denies any new trauma.  No swelling.  No difficulty ambulating.  No redness.  Patient was evaluated for this back in April and had a negative DVT study.  Additionally she reports several weeks of urinary frequency.  She denies any abdominal pain or vomiting.  No vaginal discharge or bleeding.  HPI    Past Medical History Past Medical History:  Diagnosis Date  . Asthma   . DVT (deep venous thrombosis) (HCC)   . High cholesterol   . Hypertension   . Migraine   . Obesity   . Stroke (HCC)   . TIA (transient ischemic attack)    Patient Active Problem List   Diagnosis Date Noted  . Morbid obesity (HCC)   . Essential hypertension   . Pure hypercholesterolemia   . Chest pain with moderate risk for cardiac etiology 01/11/2017  . Right shoulder pain 02/17/2013  . Migraine    Home Medication(s) Prior to Admission medications   Medication Sig Start Date End Date Taking? Authorizing Provider  albuterol (PROVENTIL HFA;VENTOLIN HFA) 108 (90 Base) MCG/ACT inhaler Inhale 1-2 puffs into the lungs every 6 (six) hours as needed for wheezing or shortness of breath. 01/24/18   Maxwell CaulLayden, Lindsey A, PA-C  amLODipine (NORVASC) 5 MG tablet Take 1 tablet (5 mg total) by mouth daily. 01/14/17   Rodolph Bonghompson, Daniel V, MD  aspirin-acetaminophen-caffeine (EXCEDRIN MIGRAINE) [phone] MG per tablet Take 2 tablets by mouth  every 6 (six) hours as needed for headache.    [provider]  atorvastatin (LIPITOR) 40 MG tablet Take 1 tablet (40 mg total) by mouth daily at 6 PM. 01/13/17   Rodolph Bonghompson, Daniel V, MD  budesonide-formoterol [facility](SYMBICORT) 160-4.5 MCG/ACT inhaler Inhale into the lungs. 06/14/18   [provider]  chlorthalidone (HYGROTON) 25 MG tablet Take 1 tablet (25 mg total) by mouth daily. 01/14/17   Rodolph Bonghompson, Daniel V, MD  fluticasone (FLONASE) 50 MCG/ACT nasal spray Place 1 spray into both nostrils daily. 01/24/18   Maxwell CaulLayden, Lindsey A, PA-C  hydrocortisone (ANUSOL-HC) 2.5 % rectal cream Apply rectally 2 times daily 01/31/18   Demetrios LollLeaphart, Kenneth T, PA-C  Multiple Vitamin (MULTIVITAMIN) capsule Take 1 capsule by mouth daily.    [provider]  nitroGLYCERIN (NITROSTAT) 0.4 MG SL tablet Place 1 tablet (0.4 mg total) under the tongue every 5 (five) minutes as needed for chest pain (CP or SOB). 01/13/17   Rodolph Bonghompson, Daniel V, MD  pantoprazole (PROTONIX) 40 MG tablet Take 1 tablet (40 mg total) by mouth daily at 6 (six) AM. 01/14/17   Rodolph Bonghompson, Daniel V, MD  Past Surgical History Past Surgical History:  Procedure Laterality Date  . LEFT HEART CATH AND CORONARY ANGIOGRAPHY N/A 01/12/2017   Procedure: Left Heart Cath and Coronary Angiography;  Surgeon: Lyn RecordsHenry W Smith, MD;  [facility] LAB;  Service: Cardiovascular;  Laterality: N/A;  . TUBAL LIGATION     Family History Family History  Problem Relation Age of Onset  . CAD Father 5350       MI/stents     Social History Social History   Tobacco Use  . Smoking status: Former Games developermoker  . Smokeless tobacco: Never Used  Substance Use Topics  . Alcohol use: Yes    Comment: occ  . Drug use: No   Allergies Patient has no known allergies.  Review of Systems Review of Systems All other systems are reviewed and  are negative for acute change except as noted in the HPI  Physical Exam Vital Signs  I have reviewed the triage vital signs BP (!) 183/94 (BP Location: Right Arm)   Pulse 71   Temp 99 F (37.2 C) (Oral)   Resp 20   Ht 5\' 4"  (1.626 m)   Wt (!) 167.8 kg   LMP 03/01/2019   SpO2 100%   BMI 63.51 kg/m   Physical Exam Vitals signs reviewed.  Constitutional:      General: She is not in acute distress.    Appearance: She is well-developed. She is obese. She is not diaphoretic.  HENT:     Head: Normocephalic and atraumatic.     Right Ear: External ear normal.     Left Ear: External ear normal.     Nose: Nose normal.  Eyes:     General: No scleral icterus.    Conjunctiva/sclera: Conjunctivae normal.  Neck:     Musculoskeletal: Normal range of motion.     Trachea: Phonation normal.  Cardiovascular:     Rate and Rhythm: Normal rate and regular rhythm.  Pulmonary:     Effort: Pulmonary effort is normal. No respiratory distress.     Breath sounds: No stridor.  Abdominal:     General: There is no distension.  Musculoskeletal: Normal range of motion.     Right lower leg: She exhibits no tenderness, no bony tenderness, no swelling and no deformity.       Legs:  Neurological:     Mental Status: She is alert and oriented to person, place, and time.  Psychiatric:        Behavior: Behavior normal.     ED Results and Treatments Labs (all labs ordered are listed, but only abnormal results are displayed) Labs Reviewed  URINALYSIS, ROUTINE W REFLEX MICROSCOPIC - Abnormal; Notable for the following components:      Result Value   Specific Gravity, Urine >1.030 (*)    Hgb urine dipstick TRACE (*)    All other components within normal limits  URINALYSIS, MICROSCOPIC (REFLEX) - Abnormal; Notable for the following components:   Bacteria, UA RARE (*)    Trichomonas, UA PRESENT (*)    All other components within normal limits  PREGNANCY, URINE  GC/CHLAMYDIA PROBE AMP ([place])  NOT AT [facility]  EKG  EKG Interpretation  Date/Time:    Ventricular Rate:    PR Interval:    QRS Duration:   QT Interval:    QTC Calculation:   R Axis:     Text Interpretation:        Radiology No results found.  Pertinent labs & imaging results that were available during my care of the patient were reviewed by me and considered in my medical decision making (see chart for details).  Medications Ordered in ED Medications  metroNIDAZOLE (FLAGYL) tablet 2,000 mg (2,000 mg Oral Given 03/20/19 2354)                                                                                                                                    Procedures Procedures  (including critical care time)  Medical Decision Making / ED Course I have reviewed the nursing notes for this encounter and the patient's prior records (if available in EHR or on provided paperwork).   Terri Boyd was evaluated in Emergency Department on 03/21/2019 for the symptoms described in the history of present illness. She was evaluated in the context of the global COVID-19 pandemic, which necessitated consideration that the patient might be at risk for infection with the SARS-CoV-2 virus that causes COVID-19. Institutional protocols and algorithms that pertain to the evaluation of patients at risk for COVID-19 are in a state of rapid change based on information released by regulatory bodies including the CDC and federal and state organizations. These policies and algorithms were followed during the patient's care in the ED.  1. Leg tingling This is intermittent.  Low suspicion for DVT.  No evidence of infection.  Doubt arterial occlusion.  2.  Urinary frequency. UA notable for trichomonas.  Patient denied any vaginal bleeding or discharge concerning for other STIs.  Treated with 2 g of Flagyl  The patient  appears reasonably screened and/or stabilized for discharge and I doubt any other medical condition or other [facility] requiring further screening, evaluation, or treatment in the ED at this time prior to discharge.  The patient is safe for discharge with strict return precautions.       Final Clinical Impression(s) / ED Diagnoses Final diagnoses:  Trichomonal infection  Paresthesia    The patient appears reasonably screened and/or stabilized for discharge and I doubt any other medical condition or other [facility] requiring further screening, evaluation, or treatment in the ED at this time prior to discharge.  Disposition: Discharge  Condition: Good  I have discussed the results, Dx and Tx plan with the patient who expressed understanding and agree(s) with the plan. Discharge instructions discussed at great length. The patient was given strict return precautions who verbalized understanding of the instructions. No further questions at time of discharge.    ED Discharge Orders    None         This chart was dictated using voice recognition software.  Despite best efforts to proofread,  errors can occur which can change the documentation meaning.   Nira Connardama, Pedro Eduardo, MD 03/21/19 [phone]

## 2019-03-22 LAB — GC/CHLAMYDIA PROBE AMP (~~LOC~~) NOT AT ARMC
Chlamydia: NEGATIVE
Neisseria Gonorrhea: NEGATIVE

## 2019-12-11 ENCOUNTER — Emergency Department (HOSPITAL_BASED_OUTPATIENT_CLINIC_OR_DEPARTMENT_OTHER)
Admission: EM | Admit: 2019-12-11 | Discharge: 2019-12-11 | Disposition: A | Discharge status: Home / Self Care | Payer: Self-pay | Attending: Emergency Medicine | Admitting: Emergency Medicine

## 2019-12-11 ENCOUNTER — Emergency Department (HOSPITAL_BASED_OUTPATIENT_CLINIC_OR_DEPARTMENT_OTHER): Payer: Self-pay

## 2019-12-11 ENCOUNTER — Other Ambulatory Visit: Payer: Self-pay

## 2019-12-11 ENCOUNTER — Encounter (HOSPITAL_BASED_OUTPATIENT_CLINIC_OR_DEPARTMENT_OTHER): Payer: Self-pay

## 2019-12-11 DIAGNOSIS — Z87891 Personal history of nicotine dependence: Secondary | ICD-10-CM | POA: Insufficient documentation

## 2019-12-11 DIAGNOSIS — I10 Essential (primary) hypertension: Secondary | ICD-10-CM | POA: Insufficient documentation

## 2019-12-11 DIAGNOSIS — M79604 Pain in right leg: Secondary | ICD-10-CM | POA: Insufficient documentation

## 2019-12-11 DIAGNOSIS — Z86718 Personal history of other venous thrombosis and embolism: Secondary | ICD-10-CM | POA: Insufficient documentation

## 2019-12-11 DIAGNOSIS — Z8673 Personal history of transient ischemic attack (TIA), and cerebral infarction without residual deficits: Secondary | ICD-10-CM | POA: Insufficient documentation

## 2019-12-11 NOTE — Discharge Instructions (Addendum)
You were evaluated in the Emergency Department and after careful evaluation, we did not find any emergent condition requiring admission or further testing in the hospital.  Your exam/testing today is overall reassuring.  No evidence of blood clots on the ultrasound.  We recommend resting the leg for the next few days, Tylenol and Motrin for discomfort, followed by stretching exercises.  You can call the orthopedic office provided if you continue to experience discomfort.  Please return to the Emergency Department if you experience any worsening of your condition.  We encourage you to follow up with a primary care provider.  Thank you for allowing [place] to be a part of your care.

## 2019-12-11 NOTE — ED Notes (Signed)
Patient transported to Ultrasound 

## 2019-12-11 NOTE — ED Triage Notes (Signed)
Pt reports that she fell and hit her right ankle about a year ago and has had continued pain since pt states it is getting worse and now radiating up her right leg.

## 2019-12-11 NOTE — ED Provider Notes (Signed)
[facility] DEPT [facility] Emergency Department Provider Note MRN:  [id number]  Arrival date & time: 12/11/19     Chief Complaint   Leg Pain   History of Present Illness   NEISHA Boyd is a 49 y.o. year-old female with a history of DVT presenting to the ED with chief complaint of leg pain.  Chronic right leg pain for over 1 year.  Seems to be changing recently.  The pain is worst in the lower shin but more recently involves the lateral aspect of the entire leg.  Denies chest pain or shortness of breath.  Had trauma 1 year ago that seems to have started this but no trauma since.  Symptoms are mild to moderate, constant, worse with motion or palpation.  Review of Systems  A complete 10 system review of systems was obtained and all systems are negative except as noted in the HPI and PMH.   Patient's Health History    Past Medical History:  Diagnosis Date  . Asthma   . DVT (deep venous thrombosis) (HCC)   . High cholesterol   . Hypertension   . Migraine   . Obesity   . Stroke (HCC)   . TIA (transient ischemic attack)     Past Surgical History:  Procedure Laterality Date  . LEFT HEART CATH AND CORONARY ANGIOGRAPHY N/A 01/12/2017   Procedure: Left Heart Cath and Coronary Angiography;  Surgeon: Lyn Records, MD;  Location: [facility] INVASIVE CV LAB;  Service: Cardiovascular;  Laterality: N/A;  . TUBAL LIGATION      Family History  Problem Relation Age of Onset  . CAD Father 55       MI/stents     Social History   Socioeconomic History  . Marital status: Single    Spouse name: Not on file  . Number of children: Not on file  . Years of education: Not on file  . Highest education level: Not on file  Occupational History  . Not on file  Tobacco Use  . Smoking status: Former Games developer  . Smokeless tobacco: Never Used  Substance and Sexual Activity  . Alcohol use: Yes    Comment: occ  . Drug use: No  . Sexual activity: Not on file  Other Topics Concern  . Not on  file  Social History Narrative  . Not on file   Social Determinants of Health   Financial Resource Strain:   . Difficulty of Paying Living Expenses:   Food Insecurity:   . Worried About Programme researcher, broadcasting/film/video in the Last Year:   . Barista in the Last Year:   Transportation Needs:   . Freight forwarder (Medical):   Marland Kitchen Lack of Transportation (Non-Medical):   Physical Activity:   . Days of Exercise per Week:   . Minutes of Exercise per Session:   Stress:   . Feeling of Stress :   Social Connections:   . Frequency of Communication with Friends and Family:   . Frequency of Social Gatherings with Friends and Family:   . Attends Religious Services:   . Active Member of Clubs or Organizations:   . Attends Banker Meetings:   Marland Kitchen Marital Status:   Intimate Partner Violence:   . Fear of Current or Ex-Partner:   . Emotionally Abused:   Marland Kitchen Physically Abused:   . Sexually Abused:      Physical Exam   Vitals:   12/11/19 1707 12/11/19 1922  BP: Marland Kitchen)  175/116 (!) 155/97  Pulse: 100 89  Resp: 20 16  Temp: 98.5 F (36.9 C)   SpO2: 95% 100%    CONSTITUTIONAL: Well-appearing, NAD NEURO:  Alert and oriented x 3, no focal deficits EYES:  eyes equal and reactive ENT/NECK:  no LAD, no JVD CARDIO: Regular rate, well-perfused, normal S1 and S2 PULM:  CTAB no wheezing or rhonchi GI/GU:  normal bowel sounds, non-distended, non-tender MSK/SPINE:  No gross deformities, no edema SKIN:  no rash, atraumatic PSYCH:  Appropriate speech and behavior  *Additional and/or pertinent findings included in MDM below  Diagnostic and Interventional Summary    EKG Interpretation  Date/Time:    Ventricular Rate:    PR Interval:    QRS Duration:   QT Interval:    QTC Calculation:   R Axis:     Text Interpretation:        Labs Reviewed - No data to display  US Venous Img Lower Unilateral Right  Final Result      Medications - No data to display   Procedures  /  Critical  Care Procedures  ED Course and Medical Decision Making  I have reviewed the triage vital signs, the nursing notes, and pertinent available records from the EMR.  Pertinent labs & imaging results that were available during my care of the patient were reviewed by me and considered in my medical decision making (see below for details).     Ultrasound to exclude DVT given patient's history.  No indication for x-ray imaging given no recent trauma.  Suspect MSK pain, possibly iliotibial band inflammation or strain.    Barth Kirks. Sedonia Small, [place] [email or web address]  Final Clinical Impressions(s) / ED Diagnoses     ICD-10-CM   1. Pain of right lower extremity  M79.604     ED Discharge Orders    None       Discharge Instructions Discussed with and Provided to Patient:     Discharge Instructions     You were evaluated in the Emergency Department and after careful evaluation, we did not find any emergent condition requiring admission or further testing in the hospital.  Your exam/testing today is overall reassuring.  No evidence of blood clots on the ultrasound.  We recommend resting the leg for the next few days, Tylenol and Motrin for discomfort, followed by stretching exercises.  You can call the orthopedic office provided if you continue to experience discomfort.  Please return to the Emergency Department if you experience any worsening of your condition.  We encourage you to follow up with a primary care provider.  Thank you for allowing [place] to be a part of your care.       [name], MD 12/11/19 2018

## 2020-07-06 IMAGING — DX DG ELBOW COMPLETE 3+V*R*
4 series · 4 of 4 positions shown · non-contrast
Comparison: None.

CLINICAL DATA: Pain with movement

EXAM:
RIGHT ELBOW - COMPLETE 3+ VIEW

[elbow ap]
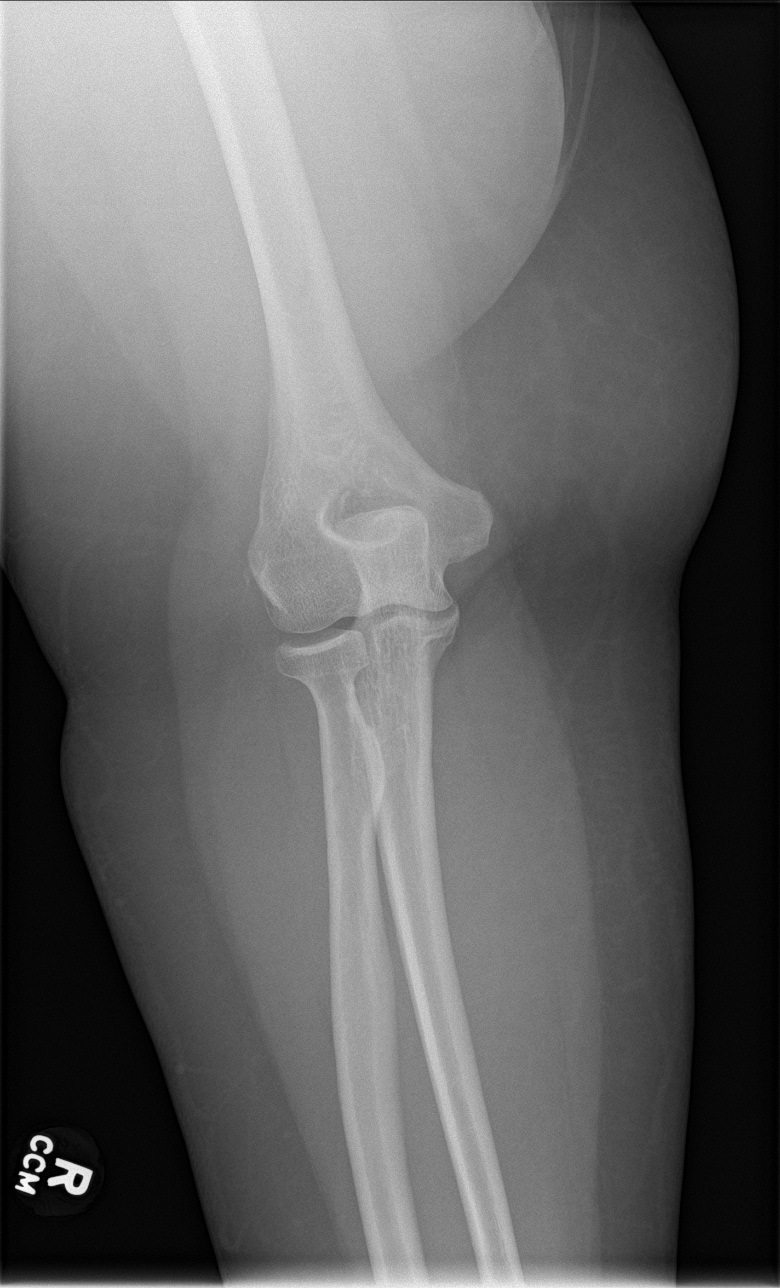

[elbow obl (1 of 2)]
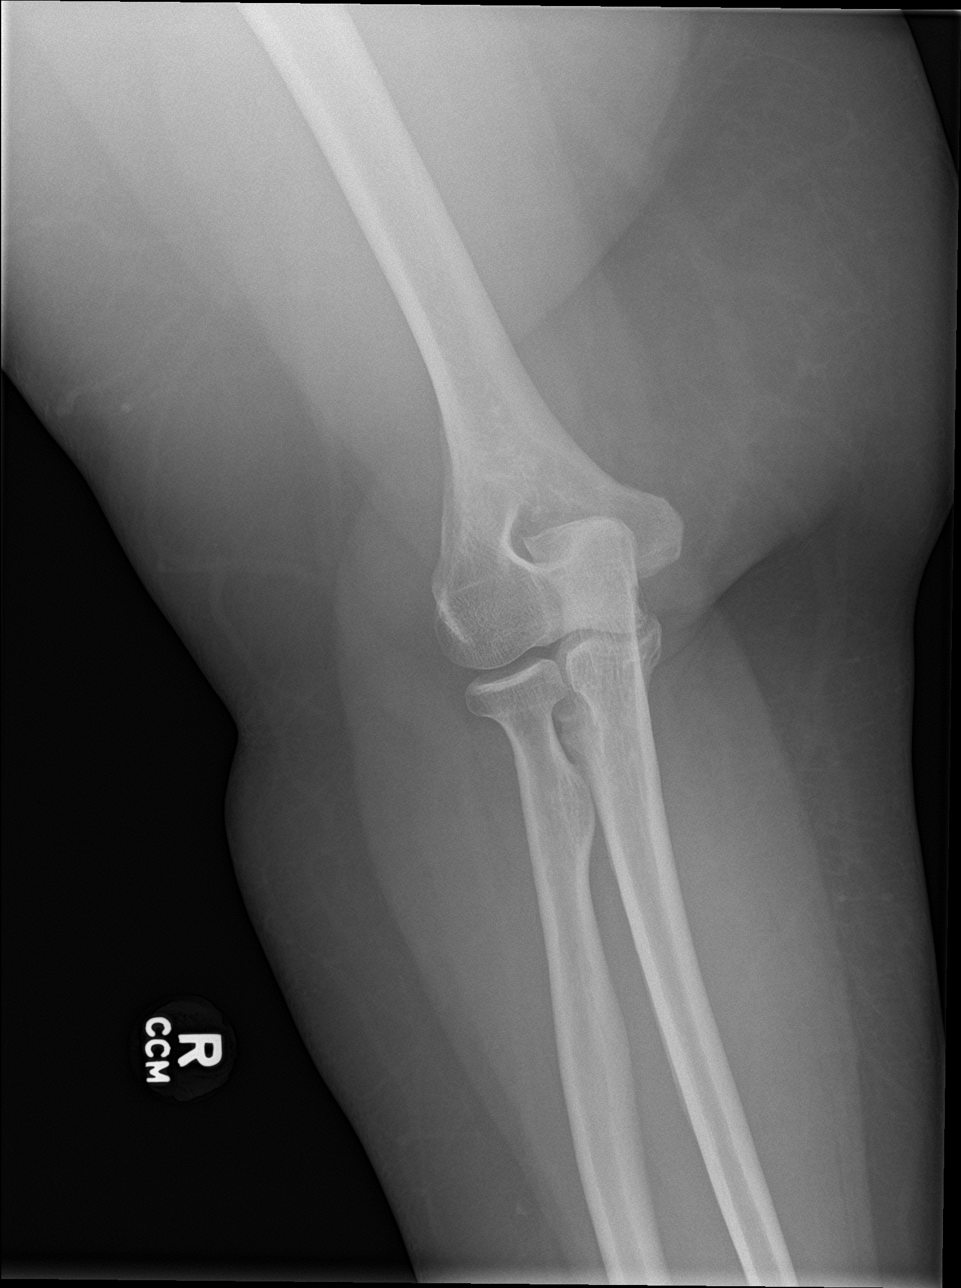

[elbow obl (2 of 2)]
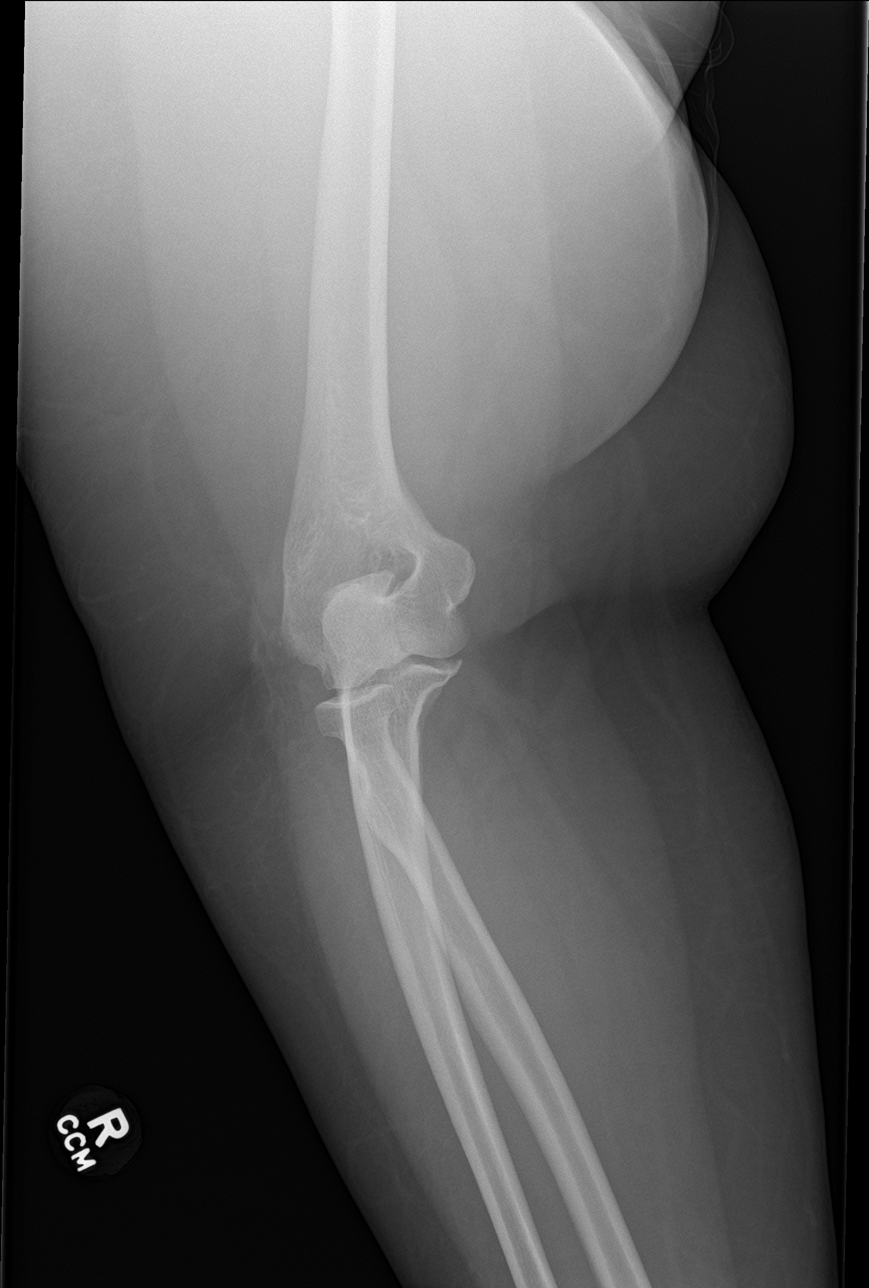

[elbow lat]
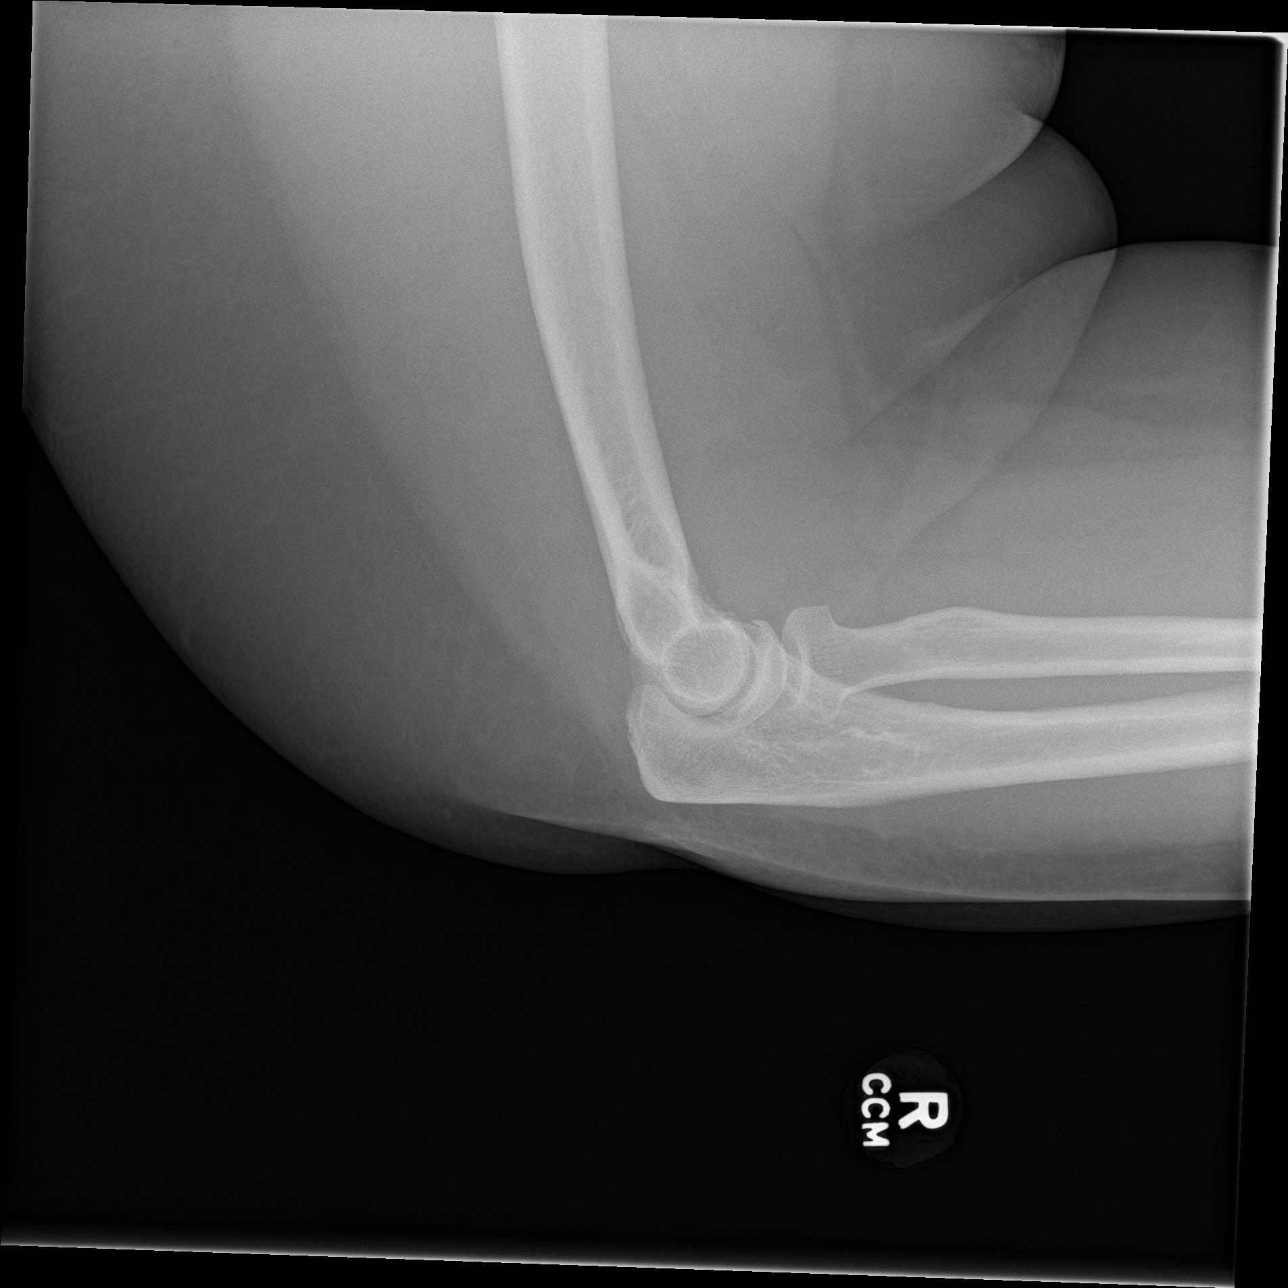

[4 of 4 positions shown; findings below may reference images not displayed]

FINDINGS: Frontal, lateral, and bilateral oblique views were obtained. There
is mild soft tissue swelling. No evident fracture or dislocation. No
appreciable joint effusion. There is a spur arising from the
coracoid process of the proximal ulna. There is no appreciable joint
space narrowing or erosion. There is a small calcification lateral
to the distal lateral humeral condyle.
IMPRESSION: Spur arising from the coracoid process of the proximal ulna. No
appreciable joint space narrowing or erosion. No fracture or
dislocation. Mild soft tissue swelling noted. Small calcification
lateral to the distal left humeral condyle. Suspect tendinosis.

## 2021-01-22 ENCOUNTER — Encounter (HOSPITAL_BASED_OUTPATIENT_CLINIC_OR_DEPARTMENT_OTHER): Payer: Self-pay | Admitting: Emergency Medicine

## 2021-01-22 ENCOUNTER — Emergency Department (HOSPITAL_BASED_OUTPATIENT_CLINIC_OR_DEPARTMENT_OTHER): Payer: Medicaid Other

## 2021-01-22 ENCOUNTER — Other Ambulatory Visit: Payer: Self-pay

## 2021-01-22 ENCOUNTER — Emergency Department (HOSPITAL_BASED_OUTPATIENT_CLINIC_OR_DEPARTMENT_OTHER)
Admission: EM | Admit: 2021-01-22 | Discharge: 2021-01-22 | Disposition: A | Discharge status: Home / Self Care | Payer: Medicaid Other | Attending: Emergency Medicine | Admitting: Emergency Medicine

## 2021-01-22 DIAGNOSIS — R0602 Shortness of breath: Secondary | ICD-10-CM

## 2021-01-22 DIAGNOSIS — R059 Cough, unspecified: Secondary | ICD-10-CM | POA: Insufficient documentation

## 2021-01-22 DIAGNOSIS — Z955 Presence of coronary angioplasty implant and graft: Secondary | ICD-10-CM | POA: Insufficient documentation

## 2021-01-22 DIAGNOSIS — J45909 Unspecified asthma, uncomplicated: Secondary | ICD-10-CM | POA: Insufficient documentation

## 2021-01-22 DIAGNOSIS — R6889 Other general symptoms and signs: Secondary | ICD-10-CM

## 2021-01-22 DIAGNOSIS — Z87891 Personal history of nicotine dependence: Secondary | ICD-10-CM | POA: Insufficient documentation

## 2021-01-22 DIAGNOSIS — R519 Headache, unspecified: Secondary | ICD-10-CM | POA: Insufficient documentation

## 2021-01-22 DIAGNOSIS — Z79899 Other long term (current) drug therapy: Secondary | ICD-10-CM | POA: Insufficient documentation

## 2021-01-22 DIAGNOSIS — I10 Essential (primary) hypertension: Secondary | ICD-10-CM | POA: Insufficient documentation

## 2021-01-22 DIAGNOSIS — Z7951 Long term (current) use of inhaled steroids: Secondary | ICD-10-CM | POA: Insufficient documentation

## 2021-01-22 DIAGNOSIS — Z20822 Contact with and (suspected) exposure to covid-19: Secondary | ICD-10-CM | POA: Insufficient documentation

## 2021-01-22 LAB — CBC WITH DIFFERENTIAL/PLATELET
Abs Immature Granulocytes: 0.04 10*3/uL (ref 0.00–0.07)
Basophils Absolute: 0 10*3/uL (ref 0.0–0.1)
Basophils Relative: 0 %
Eosinophils Absolute: 0.1 10*3/uL (ref 0.0–0.5)
Eosinophils Relative: 1 %
HCT: 36 % (ref 36.0–46.0)
Hemoglobin: 11.5 g/dL — ABNORMAL LOW (ref 12.0–15.0)
Immature Granulocytes: 0 %
Lymphocytes Relative: 11 %
Lymphs Abs: 1.3 10*3/uL (ref 0.7–4.0)
MCH: 25.8 pg — ABNORMAL LOW (ref 26.0–34.0)
MCHC: 31.9 g/dL (ref 30.0–36.0)
MCV: 80.9 fL (ref 80.0–100.0)
Monocytes Absolute: 0.7 10*3/uL (ref 0.1–1.0)
Monocytes Relative: 6 %
Neutro Abs: 9.7 10*3/uL — ABNORMAL HIGH (ref 1.7–7.7)
Neutrophils Relative %: 82 %
Platelets: 345 10*3/uL (ref 150–400)
RBC: 4.45 MIL/uL (ref 3.87–5.11)
RDW: 15.9 % — ABNORMAL HIGH (ref 11.5–15.5)
WBC: 11.8 10*3/uL — ABNORMAL HIGH (ref 4.0–10.5)
nRBC: 0 % (ref 0.0–0.2)

## 2021-01-22 LAB — TROPONIN I (HIGH SENSITIVITY)
Troponin I (High Sensitivity): 3 ng/L (ref <18)
Troponin I (High Sensitivity): 3 ng/L (ref <18)

## 2021-01-22 LAB — BRAIN NATRIURETIC PEPTIDE: B Natriuretic Peptide: 30.7 pg/mL (ref 0.0–100.0)

## 2021-01-22 LAB — HCG, SERUM, QUALITATIVE: Preg, Serum: NEGATIVE

## 2021-01-22 LAB — BASIC METABOLIC PANEL
Anion gap: 10 (ref 5–15)
BUN: 9 mg/dL (ref 6–20)
CO2: 23 mmol/L (ref 22–32)
Calcium: 8.3 mg/dL — ABNORMAL LOW (ref 8.9–10.3)
Chloride: 99 mmol/L (ref 98–111)
Creatinine, Ser: 0.76 mg/dL (ref 0.44–1.00)
GFR, Estimated: >60 mL/min (ref >60)
Glucose, Bld: 147 mg/dL — ABNORMAL HIGH (ref 70–99)
Potassium: 3.5 mmol/L (ref 3.5–5.1)
Sodium: 132 mmol/L — ABNORMAL LOW (ref 135–145)

## 2021-01-22 LAB — D-DIMER, QUANTITATIVE: D-Dimer, Quant: 0.73 ug/mL-FEU — ABNORMAL HIGH (ref 0.00–0.50)

## 2021-01-22 LAB — SARS CORONAVIRUS 2 (TAT 6-24 HRS): SARS Coronavirus 2: NEGATIVE

## 2021-01-22 MED ORDER — ALBUTEROL SULFATE HFA 108 (90 BASE) MCG/ACT IN AERS
4.0000 | INHALATION_SPRAY | Freq: Once | RESPIRATORY_TRACT | Status: AC
  Administered 2021-01-22: 4 via RESPIRATORY_TRACT
  Filled 2021-01-22: qty 6.7

## 2021-01-22 MED ORDER — IOHEXOL 350 MG/ML SOLN
100.0000 mL | Freq: Once | INTRAVENOUS | Status: AC | PRN
  Administered 2021-01-22: 100 mL via INTRAVENOUS

## 2021-01-22 MED ORDER — PREDNISONE 20 MG PO TABS
40.0000 mg | ORAL_TABLET | Freq: Once | ORAL | Status: AC
  Administered 2021-01-22: 40 mg via ORAL
  Filled 2021-01-22: qty 2

## 2021-01-22 MED ORDER — PREDNISONE 10 MG PO TABS: 40.0000 mg | ORAL_TABLET | Freq: Every day | ORAL | 0 refills | Status: AC

## 2021-01-22 NOTE — Discharge Instructions (Signed)
The Covid test is pending at time of discharge.  Instructions on how to follow this up on my chart are on your discharge paperwork, you can also call the department if you are having trouble finding these results.  If he/she is Covid positive he/she will need to be quarantine for total 5 days since the onset of symptoms +24 hours of no fever and resolving symptoms, additionally he/she needs to wear a mask near all others for 5 more days. If he/she is not Covid positive he/she is able to go back to normal day-to-day routine as long as he/she is not having fevers and it has been 24 hours since his/her last fever.  You can use the albuterol as prescribed.  4 puffs every 4 hours as needed, if you feel that you need breathing treatments beyond that you should come back to the hospital.  Also take the steroid prescribed.  Follow-up with your primary care provider.

## 2021-01-22 NOTE — ED Notes (Signed)
PT demonstrated hands on and verbal understanding of MDI and Spacer devices.

## 2021-01-22 NOTE — ED Triage Notes (Signed)
Pt states she woke up this am with sob.  Pt has felt tired and weak for a few days.  Pt had cough last night.  No fever at home.  Back pain on left side.  No leg pain.

## 2021-01-22 NOTE — ED Provider Notes (Signed)
[facility] EMERGENCY DEPARTMENT Provider Note   CSN: [id number] Arrival date & time: 01/22/21  1100     History Chief Complaint  Patient presents with  . Shortness of Breath    Terri Boyd is a 50 y.o. female.  The history is provided by the patient.  Shortness of Breath Severity:  Moderate Onset quality:  Gradual Timing:  Intermittent Progression:  Worsening Chronicity:  New Relieved by:  Nothing Worsened by:  Nothing Ineffective treatments:  None tried Associated symptoms: cough and headaches   Associated symptoms: no chest pain, no fever, no rash and no vomiting        Past Medical History:  Diagnosis Date  . Asthma   . DVT (deep venous thrombosis) (HCC)   . High cholesterol   . Hypertension   . Migraine   . Obesity   . Stroke (HCC)   . TIA (transient ischemic attack)     Patient Active Problem List   Diagnosis Date Noted  . Morbid obesity (HCC)   . Essential hypertension   . Pure hypercholesterolemia   . Chest pain with moderate risk for cardiac etiology 01/11/2017  . Right shoulder pain 02/17/2013  . Migraine     Past Surgical History:  Procedure Laterality Date  . LEFT HEART CATH AND CORONARY ANGIOGRAPHY N/A 01/12/2017   Procedure: Left Heart Cath and Coronary Angiography;  Surgeon: Lyn RecordsHenry W Smith, MD;  [facility];  Service: Cardiovascular;  Laterality: N/A;  . TUBAL LIGATION       OB History   No obstetric history on file.     Family History  Problem Relation Age of Onset  . CAD Father 5950       MI/stents     Social History   Tobacco Use  . Smoking status: Former Games developermoker  . Smokeless tobacco: Never Used  Vaping Use  . Vaping Use: Never used  Substance Use Topics  . Alcohol use: Yes    Comment: occ  . Drug use: No    Home Medications Prior to Admission medications   Medication Sig Start Date End Date Taking? Authorizing Provider  predniSONE (DELTASONE) 10 MG tablet Take 4 tablets (40 mg total) by  mouth daily for 4 days. 01/22/21 01/26/21 Yes Sabino DonovanKatz, Terri Lesesne C, MD  albuterol (PROVENTIL HFA;VENTOLIN HFA) 108 (90 Base) MCG/ACT inhaler Inhale 1-2 puffs into the lungs every 6 (six) hours as needed for wheezing or shortness of breath. 01/24/18   Maxwell CaulLayden, Lindsey A, PA-C  amLODipine (NORVASC) 5 MG tablet Take 1 tablet (5 mg total) by mouth daily. 01/14/17   Rodolph Bonghompson, Daniel V, MD  aspirin-acetaminophen-caffeine (EXCEDRIN MIGRAINE) [phone] MG per tablet Take 2 tablets by mouth every 6 (six) hours as needed for headache.    [provider]  atorvastatin (LIPITOR) 40 MG tablet Take 1 tablet (40 mg total) by mouth daily at 6 PM. 01/13/17   Rodolph Bonghompson, Daniel V, MD  budesonide-formoterol [facility](SYMBICORT) 160-4.5 MCG/ACT inhaler Inhale into the lungs. 06/14/18   [provider]  chlorthalidone (HYGROTON) 25 MG tablet Take 1 tablet (25 mg total) by mouth daily. 01/14/17   Rodolph Bonghompson, Daniel V, MD  fluticasone (FLONASE) 50 MCG/ACT nasal spray Place 1 spray into both nostrils daily. 01/24/18   Maxwell CaulLayden, Lindsey A, PA-C  hydrocortisone (ANUSOL-HC) 2.5 % rectal cream Apply rectally 2 times daily 01/31/18   Demetrios LollLeaphart, Kenneth T, PA-C  Multiple Vitamin (MULTIVITAMIN) capsule Take 1 capsule by mouth daily.    [provider]  nitroGLYCERIN (  NITROSTAT) 0.4 MG SL tablet Place 1 tablet (0.4 mg total) under the tongue every 5 (five) minutes as needed for chest pain (CP or SOB). 01/13/17   Rodolph Bong, MD  pantoprazole (PROTONIX) 40 MG tablet Take 1 tablet (40 mg total) by mouth daily at 6 (six) AM. 01/14/17   Rodolph Bong, MD    Allergies    Patient has no known allergies.  Review of Systems   Review of Systems  Constitutional: Positive for chills and fatigue. Negative for fever.  HENT: Negative for congestion and rhinorrhea.   Respiratory: Positive for cough and shortness of breath.   Cardiovascular: Negative for chest pain and palpitations.  Gastrointestinal: Negative for diarrhea, nausea and  vomiting.  Genitourinary: Negative for difficulty urinating and dysuria.  Musculoskeletal: Positive for myalgias. Negative for arthralgias and back pain.  Skin: Negative for rash and wound.  Neurological: Positive for headaches. Negative for light-headedness.    Physical Exam Updated Vital Signs BP (!) 146/79   Pulse 75   Temp 99.6 F (37.6 C) (Oral)   Resp (!) 28   Ht 5\' 4"  (1.626 m)   Wt (!) 163.3 kg   LMP 12/02/2020   SpO2 98%   BMI 61.79 kg/m   Physical Exam Vitals and nursing note reviewed. Exam conducted with a chaperone present.  Constitutional:      General: She is not in acute distress.    Appearance: Normal appearance.     Interventions: She is not intubated. HENT:     Head: Normocephalic and atraumatic.     Nose: No rhinorrhea.  Eyes:     General:        Right eye: No discharge.        Left eye: No discharge.     Conjunctiva/sclera: Conjunctivae normal.  Cardiovascular:     Rate and Rhythm: Normal rate and regular rhythm.  Pulmonary:     Effort: Pulmonary effort is normal. No tachypnea, accessory muscle usage or respiratory distress. She is not intubated.     Breath sounds: No stridor. No decreased breath sounds, wheezing, rhonchi or rales.  Chest:     Chest wall: No tenderness.  Abdominal:     General: Abdomen is flat. There is no distension.     Palpations: Abdomen is soft.     Tenderness: There is no abdominal tenderness.  Musculoskeletal:        General: No tenderness or signs of injury.  Skin:    General: Skin is warm and dry.     Capillary Refill: Capillary refill takes less than 2 seconds.  Neurological:     General: No focal deficit present.     Mental Status: She is alert. Mental status is at baseline.     Motor: No weakness.  Psychiatric:        Mood and Affect: Mood normal.        Behavior: Behavior normal.     ED Results / Procedures / Treatments   Labs (all labs ordered are listed, but only abnormal results are displayed) Labs  Reviewed  CBC WITH DIFFERENTIAL/PLATELET - Abnormal; Notable for the following components:      Result Value   WBC 11.8 (*)    Hemoglobin 11.5 (*)    MCH 25.8 (*)    RDW 15.9 (*)    Neutro Abs 9.7 (*)    All other components within normal limits  D-DIMER, QUANTITATIVE - Abnormal; Notable for the following components:   D-Dimer, Quant 0.73 (*)  All other components within normal limits  BASIC METABOLIC PANEL - Abnormal; Notable for the following components:   Sodium 132 (*)    Glucose, Bld 147 (*)    Calcium 8.3 (*)    All other components within normal limits  SARS CORONAVIRUS 2 (TAT 6-24 HRS)  BRAIN NATRIURETIC PEPTIDE  HCG, SERUM, QUALITATIVE  TROPONIN I (HIGH SENSITIVITY)  TROPONIN I (HIGH SENSITIVITY)    EKG EKG Interpretation  Date/Time:  Wednesday Jan 22 2021 11:10:47 EDT Ventricular Rate:  83 PR Interval:  148 QRS Duration: 98 QT Interval:  376 QTC Calculation: 441 R Axis:   46 Text Interpretation: Normal sinus rhythm Normal ECG Confirmed by Cherlynn Perches ([id number]) on 01/22/2021 11:58:17 AM   Radiology CT Angio Chest PE W and/or Wo Contrast  Result Date: 01/22/2021 CLINICAL DATA:  Shortness of breath and fatigue.  Elevated D-dimer. EXAM: CT ANGIOGRAPHY CHEST WITH CONTRAST TECHNIQUE: Multidetector CT imaging of the chest was performed using the standard protocol during bolus administration of intravenous contrast. Multiplanar CT image reconstructions and MIPs were obtained to evaluate the vascular anatomy. CONTRAST:  OMNIPAQUE IOHEXOL 350 MG/ML SOLN COMPARISON:  CTA chest dated January 11, 2017. FINDINGS: Cardiovascular: Mildly limited evaluation of the pulmonary arteries due to respiratory motion artifact and slightly reduced contrast opacification. No evidence of pulmonary embolism. The heart remains at the upper limits of normal in size. No pericardial effusion. Mediastinum/Nodes: No enlarged mediastinal, hilar, or axillary lymph nodes. Thyroid gland, trachea, and  esophagus demonstrate no significant findings. Lungs/Pleura: No focal consolidation, pleural effusion, or pneumothorax. Unchanged mild scarring in the posterior right lower lobe. Unchanged 4 mm nodule in the left lower lobe (series 5, image 49), stable since 2013, benign. No new pulmonary nodule. Upper Abdomen: No acute abnormality. Musculoskeletal: No chest wall abnormality. No acute or significant osseous findings. Review of the MIP images confirms the above findings. IMPRESSION: 1. No evidence of pulmonary embolism. No acute intrathoracic process. Electronically Signed   By: Obie Dredge M.D.   On: 01/22/2021 14:04   DG Chest Portable 1 View  Result Date: 01/22/2021 CLINICAL DATA:  Shortness of breath and fatigue EXAM: PORTABLE CHEST 1 VIEW COMPARISON:  Jan 24, 2018 FINDINGS: Lungs are clear. Heart size and pulmonary vascularity are normal. No adenopathy. No bone lesions. IMPRESSION: Lungs clear.  Cardiac silhouette normal. Electronically Signed   By: Bretta Bang III M.D.   On: 01/22/2021 12:39    Procedures Procedures   Medications Ordered in ED Medications  albuterol (VENTOLIN HFA) 108 (90 Base) MCG/ACT inhaler 4 puff (has no administration in time range)  predniSONE (DELTASONE) tablet 40 mg (has no administration in time range)  iohexol (OMNIPAQUE) 350 MG/ML injection 100 mL (100 mLs Intravenous Contrast Given 01/22/21 1314)    ED Course  I have reviewed the triage vital signs and the nursing notes.  Pertinent labs & imaging results that were available during my care of the patient were reviewed by me and considered in my medical decision making (see chart for details).    MDM Rules/Calculators/A&P                          Patient reported with flulike symptoms, has history of asthma but no medications at home.  Has some exertional shortness of breath but no chest pain.  Cough.  Has a history of blood clots we will get a D-dimer screening test.  EKG shows sinus rhythm no acute  ischemic change interval  abnormality or arrhythmia.  We will get a chest x-ray, will get viral testing.  Will get screening labs for heart and lung disease.  Patient has a negative cardiac biomarker.  D-dimer was elevated so she had a CT PE study was reviewed by radiology myself and there were no acute findings.  She is resting comfortably on my reassessment, able to speak full sentences able to ambulate.  Given her albuterol inhaler as she has been out of her medications at home as well as steroids, she has reactive airway disease feels it sometimes helps but she has been out.  No acute findings today on work-up.  She does have flulike symptoms and viral testing is pending at time of discharge.  She is given return precautions outpatient follow-up recommendations.  Final Clinical Impression(s) / ED Diagnoses Final diagnoses:  SOB (shortness of breath)  Flu-like symptoms    Rx / DC Orders ED Discharge Orders         Ordered    predniSONE (DELTASONE) 10 MG tablet  Daily        01/22/21 1454           Sabino Donovan, MD 01/22/21 1456

## 2021-12-02 ENCOUNTER — Other Ambulatory Visit: Payer: Self-pay

## 2021-12-02 DIAGNOSIS — Z1231 Encounter for screening mammogram for malignant neoplasm of breast: Secondary | ICD-10-CM

## 2022-03-03 ENCOUNTER — Ambulatory Visit: Payer: Medicaid Other | Admitting: *Deleted

## 2022-03-03 ENCOUNTER — Ambulatory Visit
Admission: RE | Admit: 2022-03-03 | Discharge: 2022-03-03 | Disposition: A | Discharge status: Home / Self Care | Payer: No Typology Code available for payment source | Source: Ambulatory Visit | Attending: Family Medicine | Admitting: Family Medicine

## 2022-03-03 VITALS — Wt 381.5 lb

## 2022-03-03 DIAGNOSIS — Z1231 Encounter for screening mammogram for malignant neoplasm of breast: Secondary | ICD-10-CM

## 2022-03-03 DIAGNOSIS — Z01419 Encounter for gynecological examination (general) (routine) without abnormal findings: Secondary | ICD-10-CM

## 2022-03-03 DIAGNOSIS — Z1211 Encounter for screening for malignant neoplasm of colon: Secondary | ICD-10-CM

## 2022-03-03 NOTE — Patient Instructions (Signed)
Explained breast self awareness with Macon Large. Pap smear completed today. Let her know [facility] will cover Pap smears and HPV typing every 5 years unless has a history of abnormal Pap smears. Referred patient to the [facility] for a screening mammogram on mobile unit. Appointment scheduled Tuesday, March 03, 2022 at 1410. Patient aware of appointment and will be there. Let patient know will follow up with her within the next couple weeks with results of Pap smear by phone. Informed patient that the Breast Center will follow up with her within the next couple of weeks with results of her mammogram by letter or phone. Macon Large verbalized understanding.  Wladyslawa Disbro, Kathaleen Maser, RN 1:49 PM

## 2022-03-03 NOTE — Progress Notes (Signed)
Ms. SUNDEE GARLAND is a 51 y.o. No obstetric history on file. female who presents to [facility] clinic today with no complaints.    Pap Smear: Pap smear completed today. Last Pap smear was 03/31/2016 at [facility] clinic and was normal with negative HPV. Per patient has no history of an abnormal Pap smear. Last Pap smear result is available in Epic.   Physical exam: Breasts Breasts symmetrical. No skin abnormalities bilateral breasts. No nipple retraction bilateral breasts. No nipple discharge bilateral breasts. No lymphadenopathy. No lumps palpated bilateral breasts. No complaints of pain or tenderness on exam.      Pelvic/Bimanual Ext Genitalia No lesions, no swelling and no discharge observed on external genitalia.        Vagina Vagina pink and normal texture. No lesions or discharge observed in vagina.        Cervix Cervix is present. Cervix pink and of normal texture. No discharge observed.    Uterus Uterus is present and palpable. Uterus in normal position and normal size.        Adnexae Bilateral ovaries present and palpable. No tenderness on palpation.         Rectovaginal No rectal exam completed today since patient had no rectal complaints. No skin abnormalities observed on exam.     Smoking History: Patient is a former smoker that quit in 2012.   Patient Navigation: Patient education provided. Access to services provided for patient through [facility] program.   Colorectal Cancer Screening: Per patient has never had colonoscopy completed. FIT Test given to patient to complete. No complaints today.    Breast and Cervical Cancer Risk Assessment: Patient has family history of a maternal aunt and a great maternal aunt having breast cancer. Patient has no known genetic mutations or history of radiation treatment to the chest before age 35. Patient does not have history of cervical dysplasia, immunocompromised, or DES exposure in-utero.  Risk Assessment      Risk Scores       03/03/2022   Last edited by: Meryl Dare, CMA   5-year risk: 0.9 %   Lifetime risk: 6.7 %            A: [facility] exam with pap smear No complaints.  P: Referred patient to the [facility] for a screening mammogram on mobile unit. Appointment scheduled Tuesday, March 03, 2022 at 1410.  Priscille Heidelberg, RN 03/03/2022 1:49 PM

## 2022-03-04 LAB — CYTOLOGY - PAP
Adequacy: ABSENT
Comment: NEGATIVE
Diagnosis: NEGATIVE
High risk HPV: NEGATIVE

## 2022-03-05 ENCOUNTER — Telehealth: Payer: Self-pay

## 2022-03-05 ENCOUNTER — Other Ambulatory Visit: Payer: Self-pay | Admitting: Obstetrics and Gynecology

## 2022-03-05 DIAGNOSIS — R928 Other abnormal and inconclusive findings on diagnostic imaging of breast: Secondary | ICD-10-CM

## 2022-03-05 NOTE — Telephone Encounter (Signed)
Called patient to give pap smear results. Informed patient that pap smear was normal and HPV was negative. Based on this result her next pap smear will be due in 5 years. Patient voiced understanding.  

## 2022-03-11 ENCOUNTER — Ambulatory Visit
Admission: RE | Admit: 2022-03-11 | Discharge: 2022-03-11 | Disposition: A | Discharge status: Home / Self Care | Payer: Medicaid Other | Source: Ambulatory Visit | Attending: Obstetrics and Gynecology | Admitting: Obstetrics and Gynecology

## 2022-03-11 ENCOUNTER — Ambulatory Visit
Admission: RE | Admit: 2022-03-11 | Discharge: 2022-03-11 | Disposition: A | Discharge status: Home / Self Care | Payer: No Typology Code available for payment source | Source: Ambulatory Visit | Attending: Obstetrics and Gynecology | Admitting: Obstetrics and Gynecology

## 2022-03-11 DIAGNOSIS — R928 Other abnormal and inconclusive findings on diagnostic imaging of breast: Secondary | ICD-10-CM

## 2022-04-15 ENCOUNTER — Other Ambulatory Visit: Payer: Self-pay | Admitting: Obstetrics and Gynecology

## 2022-04-15 DIAGNOSIS — N6320 Unspecified lump in the left breast, unspecified quadrant: Secondary | ICD-10-CM

## 2022-04-28 ENCOUNTER — Other Ambulatory Visit: Payer: No Typology Code available for payment source

## 2022-05-11 ENCOUNTER — Other Ambulatory Visit: Payer: No Typology Code available for payment source

## 2022-11-06 ENCOUNTER — Encounter (HOSPITAL_BASED_OUTPATIENT_CLINIC_OR_DEPARTMENT_OTHER): Payer: Self-pay | Admitting: Radiology

## 2022-11-06 ENCOUNTER — Other Ambulatory Visit: Payer: Self-pay

## 2022-11-06 ENCOUNTER — Observation Stay (HOSPITAL_BASED_OUTPATIENT_CLINIC_OR_DEPARTMENT_OTHER)
Admission: EM | Admit: 2022-11-06 | Discharge: 2022-11-08 | Disposition: A | Discharge status: Home / Self Care | Payer: Medicaid Other | Attending: Internal Medicine | Admitting: Internal Medicine

## 2022-11-06 ENCOUNTER — Emergency Department (HOSPITAL_BASED_OUTPATIENT_CLINIC_OR_DEPARTMENT_OTHER): Payer: Medicaid Other

## 2022-11-06 ENCOUNTER — Emergency Department (HOSPITAL_COMMUNITY): Payer: Medicaid Other

## 2022-11-06 DIAGNOSIS — R200 Anesthesia of skin: Principal | ICD-10-CM | POA: Insufficient documentation

## 2022-11-06 DIAGNOSIS — R079 Chest pain, unspecified: Secondary | ICD-10-CM | POA: Diagnosis present

## 2022-11-06 DIAGNOSIS — R0602 Shortness of breath: Secondary | ICD-10-CM | POA: Diagnosis not present

## 2022-11-06 DIAGNOSIS — R202 Paresthesia of skin: Secondary | ICD-10-CM

## 2022-11-06 DIAGNOSIS — R0789 Other chest pain: Secondary | ICD-10-CM

## 2022-11-06 DIAGNOSIS — R531 Weakness: Secondary | ICD-10-CM | POA: Insufficient documentation

## 2022-11-06 DIAGNOSIS — D509 Iron deficiency anemia, unspecified: Secondary | ICD-10-CM | POA: Diagnosis not present

## 2022-11-06 DIAGNOSIS — R519 Headache, unspecified: Secondary | ICD-10-CM

## 2022-11-06 DIAGNOSIS — G43909 Migraine, unspecified, not intractable, without status migrainosus: Secondary | ICD-10-CM | POA: Diagnosis not present

## 2022-11-06 DIAGNOSIS — Z23 Encounter for immunization: Secondary | ICD-10-CM | POA: Insufficient documentation

## 2022-11-06 DIAGNOSIS — J45909 Unspecified asthma, uncomplicated: Secondary | ICD-10-CM | POA: Diagnosis not present

## 2022-11-06 DIAGNOSIS — Z86718 Personal history of other venous thrombosis and embolism: Secondary | ICD-10-CM | POA: Insufficient documentation

## 2022-11-06 DIAGNOSIS — R2990 Unspecified symptoms and signs involving the nervous system: Secondary | ICD-10-CM

## 2022-11-06 DIAGNOSIS — Z87891 Personal history of nicotine dependence: Secondary | ICD-10-CM | POA: Diagnosis not present

## 2022-11-06 DIAGNOSIS — I10 Essential (primary) hypertension: Secondary | ICD-10-CM | POA: Diagnosis not present

## 2022-11-06 DIAGNOSIS — Z79899 Other long term (current) drug therapy: Secondary | ICD-10-CM | POA: Insufficient documentation

## 2022-11-06 DIAGNOSIS — Z8673 Personal history of transient ischemic attack (TIA), and cerebral infarction without residual deficits: Secondary | ICD-10-CM | POA: Insufficient documentation

## 2022-11-06 DIAGNOSIS — E7800 Pure hypercholesterolemia, unspecified: Secondary | ICD-10-CM | POA: Diagnosis present

## 2022-11-06 LAB — TROPONIN I (HIGH SENSITIVITY): Troponin I (High Sensitivity): 6 ng/L (ref <18)

## 2022-11-06 LAB — COMPREHENSIVE METABOLIC PANEL
ALT: 17 U/L (ref 0–44)
AST: 20 U/L (ref 15–41)
Albumin: 3.3 g/dL — ABNORMAL LOW (ref 3.5–5.0)
Alkaline Phosphatase: 110 U/L (ref 38–126)
Anion gap: 6 (ref 5–15)
BUN: 15 mg/dL (ref 6–20)
CO2: 24 mmol/L (ref 22–32)
Calcium: 8.3 mg/dL — ABNORMAL LOW (ref 8.9–10.3)
Chloride: 105 mmol/L (ref 98–111)
Creatinine, Ser: 0.98 mg/dL (ref 0.44–1.00)
GFR, Estimated: >60 mL/min (ref >60)
Glucose, Bld: 116 mg/dL — ABNORMAL HIGH (ref 70–99)
Potassium: 3.5 mmol/L (ref 3.5–5.1)
Sodium: 135 mmol/L (ref 135–145)
Total Bilirubin: 0.5 mg/dL (ref 0.3–1.2)
Total Protein: 7 g/dL (ref 6.5–8.1)

## 2022-11-06 LAB — CBC WITH DIFFERENTIAL/PLATELET
Abs Immature Granulocytes: 0.03 10*3/uL (ref 0.00–0.07)
Basophils Absolute: 0 10*3/uL (ref 0.0–0.1)
Basophils Relative: 1 %
Eosinophils Absolute: 0.1 10*3/uL (ref 0.0–0.5)
Eosinophils Relative: 1 %
HCT: 33.7 % — ABNORMAL LOW (ref 36.0–46.0)
Hemoglobin: 10.6 g/dL — ABNORMAL LOW (ref 12.0–15.0)
Immature Granulocytes: 0 %
Lymphocytes Relative: 42 %
Lymphs Abs: 3.6 10*3/uL (ref 0.7–4.0)
MCH: 24.3 pg — ABNORMAL LOW (ref 26.0–34.0)
MCHC: 31.5 g/dL (ref 30.0–36.0)
MCV: 77.1 fL — ABNORMAL LOW (ref 80.0–100.0)
Monocytes Absolute: 0.8 10*3/uL (ref 0.1–1.0)
Monocytes Relative: 9 %
Neutro Abs: 4 10*3/uL (ref 1.7–7.7)
Neutrophils Relative %: 47 %
Platelets: 352 10*3/uL (ref 150–400)
RBC: 4.37 MIL/uL (ref 3.87–5.11)
RDW: 16.5 % — ABNORMAL HIGH (ref 11.5–15.5)
WBC: 8.5 10*3/uL (ref 4.0–10.5)
nRBC: 0 % (ref 0.0–0.2)

## 2022-11-06 LAB — HCG, SERUM, QUALITATIVE: Preg, Serum: NEGATIVE

## 2022-11-06 LAB — BRAIN NATRIURETIC PEPTIDE: B Natriuretic Peptide: 26.8 pg/mL (ref 0.0–100.0)

## 2022-11-06 MED ORDER — IOHEXOL 350 MG/ML SOLN
100.0000 mL | Freq: Once | INTRAVENOUS | Status: AC | PRN
  Administered 2022-11-06: 75 mL via INTRAVENOUS

## 2022-11-06 MED ORDER — PROCHLORPERAZINE EDISYLATE 10 MG/2ML IJ SOLN
10.0000 mg | Freq: Once | INTRAMUSCULAR | Status: DC
  Filled 2022-11-06: qty 2

## 2022-11-06 MED ORDER — DIPHENHYDRAMINE HCL 50 MG/ML IJ SOLN
25.0000 mg | Freq: Once | INTRAMUSCULAR | Status: AC
  Administered 2022-11-06: 25 mg via INTRAVENOUS
  Filled 2022-11-06: qty 1

## 2022-11-06 MED ORDER — ACETAMINOPHEN 325 MG PO TABS
650.0000 mg | ORAL_TABLET | Freq: Once | ORAL | Status: AC
  Administered 2022-11-06: 650 mg via ORAL
  Filled 2022-11-06: qty 2

## 2022-11-06 MED ORDER — ATORVASTATIN CALCIUM 40 MG PO TABS
40.0000 mg | ORAL_TABLET | Freq: Every day | ORAL | Status: DC
  Administered 2022-11-07: 40 mg via ORAL
  Filled 2022-11-06: qty 1

## 2022-11-06 MED ORDER — KETOROLAC TROMETHAMINE 15 MG/ML IJ SOLN
15.0000 mg | Freq: Once | INTRAMUSCULAR | Status: AC
  Administered 2022-11-06: 15 mg via INTRAVENOUS
  Filled 2022-11-06: qty 1

## 2022-11-06 MED ORDER — ACETAMINOPHEN 325 MG PO TABS
650.0000 mg | ORAL_TABLET | Freq: Four times a day (QID) | ORAL | Status: DC | PRN
  Administered 2022-11-07: 650 mg via ORAL
  Filled 2022-11-06 (×4): qty 2

## 2022-11-06 MED ORDER — SODIUM CHLORIDE 0.9 % IV SOLN: INTRAVENOUS | Status: DC

## 2022-11-06 MED ORDER — ACETAMINOPHEN 650 MG RE SUPP
650.0000 mg | Freq: Four times a day (QID) | RECTAL | Status: DC | PRN
  Administered 2022-11-07: 650 mg via RECTAL

## 2022-11-06 MED ORDER — STROKE: EARLY STAGES OF RECOVERY BOOK
Freq: Once | Status: AC
  Filled 2022-11-06: qty 1

## 2022-11-06 NOTE — ED Provider Notes (Signed)
[facility] Provider Note   CSN: [id number] Arrival date & time: 11/06/22  [id number]     History  Chief Complaint  Patient presents with   Chest Pain   Numbness    Terri Boyd is a 52 y.o. female.  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  The patient states that yesterday she started to feel some paresthesias in her left cheek and on the left side of her tongue.  She states that throughout the afternoon she started to develop some chest pressure and some shortness of breath on exertion.  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  She states that the chest pressure was still present when she woke up this morning which prompted her to come to the emergency department.  She states on her prior TIA she did have some left-sided paresthesias.  She denies any associated headache, nausea, vomiting or diarrhea.  She denies any recent lower extremity swelling.  The history is provided by the patient.  Chest Pain      Home Medications Prior to Admission medications   Medication Sig Start Date End Date Taking? Authorizing Provider  aspirin-acetaminophen-caffeine (EXCEDRIN MIGRAINE) [phone] MG per tablet Take 2 tablets by mouth as needed for headache or migraine.   Yes [provider]  furosemide (LASIX) 20 MG tablet Take 20 mg by mouth as needed for fluid or edema.   Yes [provider]  albuterol (PROVENTIL HFA;VENTOLIN HFA) 108 (90 Base) MCG/ACT inhaler Inhale 1-2 puffs into the lungs every 6 (six) hours as needed for wheezing or shortness of  breath. Patient not taking: Reported on 03/03/2022 01/24/18   Providence Lanius A, PA-C  amLODipine (NORVASC) 5 MG tablet Take 1 tablet (5 mg total) by mouth daily. Patient not taking: Reported on 03/03/2022 01/14/17   Eugenie Filler, MD  atorvastatin (LIPITOR) 40 MG tablet Take 1 tablet (40 mg total) by mouth daily at 6 PM. Patient not taking: Reported on 03/03/2022 01/13/17   Eugenie Filler, MD  chlorthalidone (HYGROTON) 25 MG tablet Take 1 tablet (25 mg total) by mouth daily. Patient not taking: Reported on 03/03/2022 01/14/17   Eugenie Filler, MD  fluticasone [facility]) 50 MCG/ACT nasal spray Place 1 spray into both nostrils daily. Patient not taking: Reported on 11/06/2022 01/24/18   Volanda Napoleon, PA-C  hydrocortisone (ANUSOL-HC) 2.5 % rectal cream Apply rectally 2 times daily Patient not taking: Reported on 03/03/2022 01/31/18   Ocie Cornfield T, PA-C  nitroGLYCERIN (NITROSTAT) 0.4 MG SL tablet Place 1 tablet (0.4 mg total) under the tongue every 5 (five) minutes as needed for chest pain (CP or SOB). Patient not taking: Reported on 03/03/2022 01/13/17   Eugenie Filler, MD  pantoprazole (PROTONIX) 40 MG tablet Take 1 tablet (40 mg total) by mouth daily at 6 (six) AM. Patient not taking: Reported on 03/03/2022 01/14/17   Eugenie Filler, MD      Allergies    Patient has no known allergies.    Review of Systems   Review of Systems  Cardiovascular:  Positive for chest  pain.    Physical Exam Updated Vital Signs BP (!) 165/98   Pulse 70   Temp 98.9 F (37.2 C)   Resp 18   Ht '5\' 3"'$  (1.6 m)   Wt (!) 163.3 kg   LMP 10/20/2022 (Approximate)   SpO2 99%   BMI 63.77 kg/m  Physical Exam Vitals and nursing note reviewed.  Constitutional:      General: She is not in acute distress.    Appearance: She is well-developed. She is obese.  HENT:     Head: Normocephalic and atraumatic.  Eyes:     Extraocular Movements: Extraocular movements intact.     Pupils: Pupils are equal,  round, and reactive to light.  Cardiovascular:     Rate and Rhythm: Normal rate and regular rhythm.     Pulses:          Radial pulses are 2+ on the right side and 2+ on the left side.     Heart sounds: Normal heart sounds.  Pulmonary:     Effort: Pulmonary effort is normal.     Breath sounds: Normal breath sounds.  Chest:     Chest wall: No tenderness.  Abdominal:     Palpations: Abdomen is soft.     Tenderness: There is no abdominal tenderness.  Musculoskeletal:        General: Normal range of motion.     Cervical back: Normal range of motion and neck supple.     Right lower leg: No edema.     Left lower leg: No edema.  Skin:    General: Skin is warm and dry.  Neurological:     General: No focal deficit present.     Mental Status: She is alert and oriented to person, place, and time.     Cranial Nerves: No cranial nerve deficit.     Motor: No weakness.     Comments: Sensation intact throughout face and all 4 extremities and is equal bilaterally No drift in bilateral UE and LE Normal finger to nose bilaterally Normal speech  Psychiatric:        Mood and Affect: Mood normal.        Behavior: Behavior normal.     ED Results / Procedures / Treatments   Labs (all labs ordered are listed, but only abnormal results are displayed) Labs Reviewed  COMPREHENSIVE METABOLIC PANEL - Abnormal; Notable for the following components:      Result Value   Glucose, Bld 116 (*)    Calcium 8.3 (*)    Albumin 3.3 (*)    All other components within normal limits  CBC WITH DIFFERENTIAL/PLATELET - Abnormal; Notable for the following components:   Hemoglobin 10.6 (*)    HCT 33.7 (*)    MCV 77.1 (*)    MCH 24.3 (*)    RDW 16.5 (*)    All other components within normal limits  BRAIN NATRIURETIC PEPTIDE  HCG, SERUM, QUALITATIVE  CBC WITH DIFFERENTIAL/PLATELET  TROPONIN I (HIGH SENSITIVITY)    EKG EKG Interpretation  Date/Time:  Friday November 06 2022 07:02:40 EST Ventricular Rate:   89 PR Interval:  142 QRS Duration: 109 QT Interval:  374 QTC Calculation: 456 R Axis:   30 Text Interpretation: Sinus rhythm Borderline T wave abnormalities No significant change since last tracing Confirmed by Leanord Asal ([id number]) on 11/06/2022 7:09:15 AM  Radiology CT Angio Head Neck W WO CM  Result Date: 11/06/2022 CLINICAL DATA:  Left facial numbness and weakness beginning yesterday afternoon.  EXAM: CT ANGIOGRAPHY HEAD AND NECK TECHNIQUE: Multidetector CT imaging of the head and neck was performed using the standard protocol during bolus administration of intravenous contrast. Multiplanar CT image reconstructions and MIPs were obtained to evaluate the vascular anatomy. Carotid stenosis measurements (when applicable) are obtained utilizing NASCET criteria, using the distal internal carotid diameter as the denominator. RADIATION DOSE REDUCTION: This exam was performed according to the departmental dose-optimization program which includes automated exposure control, adjustment of the mA and/or kV according to patient size and/or use of iterative reconstruction technique. CONTRAST:  24m OMNIPAQUE IOHEXOL 350 MG/ML SOLN COMPARISON:  Brain MRI 08/17/2020 FINDINGS: CT HEAD FINDINGS Brain: There is no acute intracranial hemorrhage, extra-axial fluid collection, or acute infarct. Parenchymal volume is normal. The ventricles are normal in size. Gray-white differentiation is preserved. The pituitary and suprasellar region are normal. There is no mass lesion. There is no mass effect or midline shift. Vascular: See below. Skull: Normal. Negative for fracture or focal lesion. Sinuses/Orbits: The paranasal sinuses are clear. The mastoid air cells are clear. The globes and orbits are unremarkable. Other: None. Review of the MIP images confirms the above findings CTA NECK FINDINGS Evaluation of the arterial vasculature is essentially nondiagnostic due to bolus timing. There is mild calcified plaque of the carotid  bifurcations. The degree of stenosis can not be assessed. Patency of the vertebral arteries can not be assessed. Skeleton: There is no acute osseous abnormality or suspicious osseous lesion. There is no visible canal hematoma. 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 Other neck: The soft tissues of the neck are unremarkable. Upper chest: The imaged lung apices are clear. Review of the MIP images confirms the above findings CTA HEAD FINDINGS Evaluation of the intracranial arterial vasculature is markedly limited by bolus timing. Anterior circulation: Intracranial ICAs appear grossly patent with calcified plaque. The bilateral M1 segments appear patent. The distal branches cannot be assessed. The bilateral A1 segments are patent. The distal branches cannot be assessed. Aneurysm can not be excluded. Posterior circulation: The posterior circulation cannot be assessed. Venous sinuses: Patent. Review of the MIP images confirms the above findings IMPRESSION: 1. No acute intracranial pathology on initial noncontrast head CT. 2. Nondiagnostic CTA head/neck due to bolus timing and body habitus. Recommend repeat study or MRA if there is persistent clinical concern. Electronically Signed   By: PValetta MoleM.D.   On: 11/06/2022 08:42   DG Chest 2 View  Result Date: 11/06/2022 CLINICAL DATA:  Chest pressure and shortness of breath on exertion EXAM: CHEST - 2 VIEW COMPARISON:  Chest radiograph dated 01/22/2021 FINDINGS: Normal lung volumes. No focal consolidations. No pleural effusion or pneumothorax. Similar cardiomediastinal silhouette. The visualized skeletal structures are unremarkable. IMPRESSION: No active cardiopulmonary disease. Electronically Signed   By: LDarrin NipperM.D.   On: 11/06/2022 08:34    Procedures Procedures    Medications Ordered in ED Medications  iohexol (OMNIPAQUE) 350 MG/ML injection 100 mL (75 mLs Intravenous  Contrast Given 11/06/22 0805)    ED Course/ Medical Decision Making/ A&P Clinical Course as of 11/06/22 1105  Fri Nov 06, 2022  0818 Initial troponin negative and labs are at baseline without acute abnormality. Chest pain intermittent since yesterday so single troponin is sufficient.  CT and CXR are pending at this time. [VK]  0850 CTH negative for acute abnormalities, CTA non-diagnostic due to timing of contrast. [VK]  0913 Patient continues to  report paresthesias on the left side of her face and her tongue.  She states that the paresthesias in her left arm and leg have resolved.  Plan to speak with neurology regarding recommendations on MRI. [VK]  0928 I spoke with Dr. Rory Percy of neurology who recommended MRI/MRA as long as patient's girth is appropriate for MRI machine. He recommends admission if positive. [VK]  1015 Patient's girth was measured by CT tech and confirmed she will be able to fit the MRI machine. She will be transferred to [facility] ED for MRI/MRA. [VK]  [id number] Dr. Wyvonnia Dusky is accepting for transfer. [VK]    Clinical Course User Index [VK] Kemper Durie, DO                             Medical Decision Making This patient presents to the ED with chief complaint(s) of chest pain, paresthesias with pertinent past medical history of TIA, migraines, prior DVT not on [facility] which further complicates the presenting complaint. The complaint involves an extensive differential diagnosis and also carries with it a high risk of complications and morbidity.    The differential diagnosis includes ACS, arrhythmia, anemia, electrolyte abnormality, TIA, patient has no focal neurologic deficits on exam at this time making CVA less likely, ICH, mass effect  Additional history obtained: Additional history obtained from N/A Records reviewed previous admission documents and Primary Care Documents  ED Course and Reassessment: Patient was awake alert well-appearing on arrival no acute distress.  She had  no focal neurologic deficits on exam and is been greater than 12 hours since onset of her symptoms so she will not be a stroke alert.  Patient has no radiation of pain and equal pulses bilaterally and no neurologic deficits making dissection unlikely, EKG was performed on arrival showed no acute ischemic changes however labs including troponin will be performed to further evaluate for ACS.  Continue to be closely monitored.  Independent labs interpretation:  The following labs were independently interpreted: within normal range  Independent visualization of imaging: - I independently visualized the following imaging with scope of interpretation limited to determining acute life threatening conditions related to emergency care: CTH, CXR, which revealed no acute disease  Consultation: - Consulted or discussed management/test interpretation w/ external professional: neurology  Consideration for admission or further workup: patient requires ED to ED transfer for further work up with MRI Social Determinants of health: N/A    Amount and/or Complexity of Data Reviewed Labs: ordered. Radiology: ordered.  Risk Prescription drug management.          Final Clinical Impression(s) / ED Diagnoses Final diagnoses:  Paresthesia  Atypical chest pain    Rx / DC Orders ED Discharge Orders     None         Kemper Durie, DO 11/06/22 1105

## 2022-11-06 NOTE — ED Triage Notes (Signed)
Pt reports chest pressure since yesterday morning with shortness of breath on exertion. Pt reports numbness to left side face and weakness to left side that began yesterday afternoon. Pt reports intermittent pain in bilateral legs as well. Pt reports hx of TIA and hx of DVT. Pt does not take blood thinners.

## 2022-11-06 NOTE — H&P (Signed)
History and Physical    Patient: Terri Boyd DOB: [birth year] DOA: 11/06/2022 DOS: the patient was seen and examined on 11/06/2022 PCP: [facility]  Patient coming from: Home - lives with her parents and oldest son. Ambulates independently.    Chief Complaint: chest pressure with left sided weakness/facial numbness  HPI: Terri Boyd is a 52 y.o. female with medical history significant of asthma, hx of DVT, HTN, HLD, hx of TIA presenting with chest pressure, left sided weakness and facial numbness on the left side that started yesterday.  She states yesterday she had on and off left sided chest pain/tightness that was intermittent but became progressively worse. Last night she had left sided facial numbness.  This morning she had weakness on her left side and was short of breath with exertion that was abnormal for her.  She went to [facility] and was transferred to [facility] ED.  She states the left side of her face still feels numb, but weakness has resolved. She also has a headache, over her left eye. Typically her headaches go away, but this one is staying.  No photophobia or phonophobia.    She does not smoke or drink. Denies any family history of CVAs in her family.     Denies any fever/chills, palpitations, cough, abdominal pain, N/V/D, dysuria or leg swelling.    ER Course:  vitals: afebrile, bp: 187/92, HR: 88, RR: 25, oxygen: 97%RA Pertinent labs: hgb: 10.6, MCV: 77.1,  CXR: normal, no acute finding CTA head/neck: no acute finding. Non diagnostic study due to bolus timing for CTA.  In ED: neurology consulted. TRH asked to admit.    Review of Systems: As mentioned in the history of present illness. All other systems reviewed and are negative. Past Medical History:  Diagnosis Date   Asthma    DVT (deep venous thrombosis) (HCC)    High cholesterol    Hypertension    Migraine    Obesity    Stroke [facility])    TIA (transient ischemic attack)    Past  Surgical History:  Procedure Laterality Date   LEFT HEART CATH AND CORONARY ANGIOGRAPHY N/A 01/12/2017   Procedure: Left Heart Cath and Coronary Angiography;  Surgeon: Belva Crome, MD;  Location: [facility];  Service: Cardiovascular;  Laterality: N/A;   TUBAL LIGATION     Social History:  reports that she has quit smoking. Her smoking use included cigarettes. She has never used smokeless tobacco. She reports current alcohol use. She reports that she does not use drugs.  No Known Allergies  Family History  Problem Relation Age of Onset   CAD Father 36       MI/stents    Breast cancer Maternal Aunt     Prior to Admission medications   Medication Sig Start Date End Date Taking? Authorizing Provider  aspirin-acetaminophen-caffeine (EXCEDRIN MIGRAINE) [phone] MG per tablet Take 2 tablets by mouth as needed for headache or migraine.   Yes [provider]  furosemide (LASIX) 20 MG tablet Take 20 mg by mouth as needed for fluid or edema.   Yes [provider]  albuterol (PROVENTIL HFA;VENTOLIN HFA) 108 (90 Base) MCG/ACT inhaler Inhale 1-2 puffs into the lungs every 6 (six) hours as needed for wheezing or shortness of breath. Patient not taking: Reported on 03/03/2022 01/24/18   Providence Lanius A, PA-C  amLODipine (NORVASC) 5 MG tablet Take 1 tablet (5 mg total) by mouth daily. Patient not taking: Reported on 03/03/2022  01/14/17   Eugenie Filler, MD  atorvastatin (LIPITOR) 40 MG tablet Take 1 tablet (40 mg total) by mouth daily at 6 PM. Patient not taking: Reported on 03/03/2022 01/13/17   Eugenie Filler, MD  chlorthalidone (HYGROTON) 25 MG tablet Take 1 tablet (25 mg total) by mouth daily. Patient not taking: Reported on 03/03/2022 01/14/17   Eugenie Filler, MD  fluticasone [facility]) 50 MCG/ACT nasal spray Place 1 spray into both nostrils daily. Patient not taking: Reported on 11/06/2022 01/24/18   Volanda Napoleon, PA-C  hydrocortisone (ANUSOL-HC) 2.5 % rectal  cream Apply rectally 2 times daily Patient not taking: Reported on 03/03/2022 01/31/18   Ocie Cornfield T, PA-C  nitroGLYCERIN (NITROSTAT) 0.4 MG SL tablet Place 1 tablet (0.4 mg total) under the tongue every 5 (five) minutes as needed for chest pain (CP or SOB). Patient not taking: Reported on 03/03/2022 01/13/17   Eugenie Filler, MD  pantoprazole (PROTONIX) 40 MG tablet Take 1 tablet (40 mg total) by mouth daily at 6 (six) AM. Patient not taking: Reported on 03/03/2022 01/14/17   Eugenie Filler, MD    Physical Exam: Vitals:   11/06/22 1500 11/06/22 1550 11/06/22 1657 11/06/22 2112  BP: (!) 164/78  (!) 151/79 (!) 170/61  Pulse: 75 72 72 73  Resp: '18  18 18  '$ Temp: 97.7 F (36.5 C) 97.7 F (36.5 C) 98.5 F (36.9 C) 98.1 F (36.7 C)  TempSrc:   Oral Oral  SpO2: 100% 100% 96% 100%  Weight:      Height:       General:  Appears calm and comfortable and is in NAD morbidly obese  Eyes:  PERRL, EOMI, normal lids, iris ENT:  grossly normal hearing, lips & tongue, mmm; appropriate dentition Neck:  no LAD, masses or thyromegaly; no carotid bruits Cardiovascular:  RRR, no m/r/g. No LE edema.  Respiratory:   CTA bilaterally with no wheezes/rales/rhonchi.  Normal respiratory effort. Abdomen:  soft, NT, ND, NABS Back:   normal alignment, no CVAT Skin:  no rash or induration seen on limited exam Musculoskeletal:  grossly normal tone BUE/BLE, good ROM, no bony abnormality. Reproducible pain over anterior chest wall  Lower extremity.  Limited foot exam with no ulcerations.  2+ distal pulses. Psychiatric:  grossly normal mood and affect, speech fluent and appropriate, AOx3 Neurologic:  CN 2-12 grossly intact, moves all extremities in coordinated fashion, sensation intact. HTK intact bilaterally. FTN intact bilaterally. DTR 2+. Gait normal.    Radiological Exams on Admission: Independently reviewed - see discussion in A/P where applicable  CT Angio Head Neck W WO CM  Result Date:  11/06/2022 CLINICAL DATA:  Left facial numbness and weakness beginning yesterday afternoon. EXAM: CT ANGIOGRAPHY HEAD AND NECK TECHNIQUE: Multidetector CT imaging of the head and neck was performed using the standard protocol during bolus administration of intravenous contrast. Multiplanar CT image reconstructions and MIPs were obtained to evaluate the vascular anatomy. Carotid stenosis measurements (when applicable) are obtained utilizing NASCET criteria, using the distal internal carotid diameter as the denominator. RADIATION DOSE REDUCTION: This exam was performed according to the departmental dose-optimization program which includes automated exposure control, adjustment of the mA and/or kV according to patient size and/or use of iterative reconstruction technique. CONTRAST:  18m OMNIPAQUE IOHEXOL 350 MG/ML SOLN COMPARISON:  Brain MRI 08/17/2020 FINDINGS: CT HEAD FINDINGS Brain: There is no acute intracranial hemorrhage, extra-axial fluid collection, or acute infarct. Parenchymal volume is normal. The ventricles are normal in size. Gray-white differentiation  is preserved. The pituitary and suprasellar region are normal. There is no mass lesion. There is no mass effect or midline shift. Vascular: See below. Skull: Normal. Negative for fracture or focal lesion. Sinuses/Orbits: The paranasal sinuses are clear. The mastoid air cells are clear. The globes and orbits are unremarkable. Other: None. Review of the MIP images confirms the above findings CTA NECK FINDINGS Evaluation of the arterial vasculature is essentially nondiagnostic due to bolus timing. There is mild calcified plaque of the carotid bifurcations. The degree of stenosis can not be assessed. Patency of the vertebral arteries can not be assessed. Skeleton: There is no acute osseous abnormality or suspicious osseous lesion. There is no visible canal hematoma. There is occlusion of the jaw with the tip of the mandibular incisor located 1.2 cm anterior to  the maxillary alveolar ridge. Other neck: The soft tissues of the neck are unremarkable. Upper chest: The imaged lung apices are clear. Review of the MIP images confirms the above findings CTA HEAD FINDINGS Evaluation of the intracranial arterial vasculature is markedly limited by bolus timing. Anterior circulation: Intracranial ICAs appear grossly patent with calcified plaque. The bilateral M1 segments appear patent. The distal branches cannot be assessed. The bilateral A1 segments are patent. The distal branches cannot be assessed. Aneurysm can not be excluded. Posterior circulation: The posterior circulation cannot be assessed. Venous sinuses: Patent. Review of the MIP images confirms the above findings IMPRESSION: 1. No acute intracranial pathology on initial noncontrast head CT. 2. Nondiagnostic CTA head/neck due to bolus timing and body habitus. Recommend repeat study or MRA if there is persistent clinical concern. Electronically Signed   By: Valetta Mole M.D.   On: 11/06/2022 08:42   DG Chest 2 View  Result Date: 11/06/2022 CLINICAL DATA:  Chest pressure and shortness of breath on exertion EXAM: CHEST - 2 VIEW COMPARISON:  Chest radiograph dated 01/22/2021 FINDINGS: Normal lung volumes. No focal consolidations. No pleural effusion or pneumothorax. Similar cardiomediastinal silhouette. The visualized skeletal structures are unremarkable. IMPRESSION: No active cardiopulmonary disease. Electronically Signed   By: Darrin Nipper M.D.   On: 11/06/2022 08:34    EKG: Independently reviewed.  NSR with rate 89; nonspecific ST changes with no evidence of acute ischemia   Labs on Admission: I have personally reviewed the available labs and imaging studies at the time of the admission.  Pertinent labs:   hgb: 10.6,  MCV: 77.1,  Assessment and Plan: Principal Problem:   Stroke-like symptoms Active Problems:   Migraine   Chest pain   Microcytic anemia   Essential hypertension   Pure hypercholesterolemia     Assessment and Plan: * Stroke-like symptoms 52 year old female presenting with 1 day history of intermittent left sided numbness and left sided weakness. Concern for CVA vs migraine.  -place in observation on telemetry for TIA/stroke work-up -Neurochecks per protocol -Neurology consulted -MRI brain/MRA brain. If body habitus prohibits MRI, repeat CT head in 24 hours  -echo -lipid panel, A1c. Starting back lipitor  -ASA and plavix TBD per neurology  -Permissive hypertension first 24 hours <220/110 -N.p.o. until bedside swallow screen -PT/OT   Migraine Intractable headache. Has no photophobia/phonophobia,? Migraine Received toradol and benadryl and still has headache Give dose of compazine  Gentle, time limited IVF  F/u on MRI    Chest pain Reproducible pain, likely MSK: costochondritis NSAIDs, heating pad  Troponin wnl x 3 and EKG wnl   Microcytic anemia Baseline hgb of 10-11 Stable Check iron studies   Essential  hypertension Allow for permissive HTN Has not been on medication in years. Ran out and never refilled  PRN IV with parameters  Pure hypercholesterolemia Not on statin. Ran out years ago and never refilled Start back lipitor '40mg'$ , f/u on lipid panel     Advance Care Planning:   Code Status: Full Code   Consults: neurology   DVT Prophylaxis: SCDs   Family Communication: none   Severity of Illness: The appropriate patient status for this patient is OBSERVATION. Observation status is judged to be reasonable and necessary in order to provide the required intensity of service to ensure the patient's safety. The patient's presenting symptoms, physical exam findings, and initial radiographic and laboratory data in the context of their medical condition is felt to place them at decreased risk for further clinical deterioration. Furthermore, it is anticipated that the patient will be medically stable for discharge from the hospital within 2 midnights of  admission.   Author: Orma Flaming, MD 11/06/2022 11:02 PM  For on call review [email or web address].

## 2022-11-06 NOTE — Assessment & Plan Note (Signed)
Reproducible pain, likely MSK: costochondritis NSAIDs, heating pad  Troponin wnl x 3 and EKG wnl

## 2022-11-06 NOTE — ED Notes (Signed)
Notified lab to add qualitative preg

## 2022-11-06 NOTE — ED Notes (Signed)
Pt sitting up on edge of bed eating her meal tray.

## 2022-11-06 NOTE — Assessment & Plan Note (Signed)
52 year old female presenting with 1 day history of intermittent left sided numbness and left sided weakness. Concern for CVA vs migraine.  -place in observation on telemetry for TIA/stroke work-up -Neurochecks per protocol -Neurology consulted -MRI brain/MRA brain. If body habitus prohibits MRI, repeat CT head in 24 hours  -echo -lipid panel, A1c. Starting back lipitor  -ASA and plavix TBD per neurology  -Permissive hypertension first 24 hours <220/110 -N.p.o. until bedside swallow screen -PT/OT

## 2022-11-06 NOTE — ED Notes (Signed)
Patient arrives by [facility] for MRI.

## 2022-11-06 NOTE — Plan of Care (Signed)
ON CALL PHONE CONSULT  Call from Dr. Kingsley'[email or web address]  [facility]   Discussion 52 year old with past medical history of TIAs presenting with transient episodes of left-sided weakness and facial numbness since yesterday afternoon.  Also reports intermittent leg pain bilaterally.  Has a history of TIAs per patient as well as history of DVT currently not on blood thinners. Recommended getting an MRI to rule out any acute process.  Due to body habitus, specifically girth, MRI thinks she may not be able to fit into the MRI scanner. If she is able to get an MRI, do an ED to ED transfer and call neurology if MRI is positive. If she is unable to get the MRI, I would recommend getting a CT in 24 hours to ensure that there is no acute process-in that case, admit to hospitalist and perform a TIA workup. This was discussed with Dr. 41  -- Maylon Peppers, MD Neurologist [facility] Pager: [phone]

## 2022-11-06 NOTE — Assessment & Plan Note (Signed)
Intractable headache. Has no photophobia/phonophobia,? Migraine Received toradol and benadryl and still has headache Give dose of compazine  Gentle, time limited IVF  F/u on MRI

## 2022-11-06 NOTE — Assessment & Plan Note (Signed)
Baseline hgb of 10-11 Stable Check iron studies

## 2022-11-06 NOTE — Consult Note (Signed)
Neurology Consultation  Reason for Consult: Code Stroke Referring Physician: Maylon Peppers  CC: left sided weakness and facial numbness  History is obtained from: Patient  HPI: Terri Boyd is a 52 y.o. female with a past medical history of Asthma, obesity, migraine, HTN, HLD, DVT no longer on [place], and TIAs presenting with chest pressure, left sided weakness, and facial numbness in the left and tongue that initially started 2/22 in the afternoon. 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 She states that the chest pressure was still present when she woke up this morning which prompted her to come to the emergency department. She states on her prior TIAs she did have some left-sided paresthesias, always preceded by a severe headache. She is currently reporting left facial paresthesias and left sided weakness. She appears to have slight left nasolabial fold flattening as well. She was recently approved for medicaid. She has an new patient appointment on Tuesday with a PCP. She is currently not taking any medications for maintenance or rescue for her migraines. She is currently only taking her furosemide. She does not take any antiplatelet medications.   Premorbid modified Rankin scale (mRS):  0-Completely asymptomatic and back to baseline post-stroke   ROS: Full ROS was performed and is negative except as noted in the HPI.   Past Medical History:  Diagnosis Date   Asthma    DVT (deep venous thrombosis) (HCC)    High cholesterol    Hypertension    Migraine    Obesity    Stroke ([place])    TIA (transient ischemic attack)     Family History  Problem Relation Age of Onset   CAD Father 65       MI/stents    Breast cancer Maternal Aunt      Social History:   reports  that she has quit smoking. Her smoking use included cigarettes. She has never used smokeless tobacco. She reports current alcohol use. She reports that she does not use drugs.  Medications No current facility-administered medications for this encounter.  Current Outpatient Medications:    aspirin-acetaminophen-caffeine (EXCEDRIN MIGRAINE) 250-250-65 MG per tablet, Take 2 tablets by mouth as needed for headache or migraine., Disp: , Rfl:    furosemide (LASIX) 20 MG tablet, Take 20 mg by mouth as needed for fluid or edema., Disp: , Rfl:    albuterol (PROVENTIL HFA;VENTOLIN HFA) 108 (90 Base) MCG/ACT inhaler, Inhale 1-2 puffs into the lungs every 6 (six) hours as needed for wheezing or shortness of breath. (Patient not taking: Reported on 03/03/2022), Disp: 1 Inhaler, Rfl: 0   amLODipine (NORVASC) 5 MG tablet, Take 1 tablet (5 mg total) by mouth daily. (Patient not taking: Reported on 03/03/2022), Disp: 30 tablet, Rfl: 1   atorvastatin (LIPITOR) 40 MG tablet, Take 1 tablet (40 mg total) by mouth daily at 6 PM. (Patient not taking: Reported on 03/03/2022), Disp: 30 tablet, Rfl: 1   chlorthalidone (HYGROTON) 25 MG tablet, Take 1 tablet (25 mg total) by mouth daily. (Patient not taking: Reported on 03/03/2022), Disp: 30 tablet, Rfl: 1   fluticasone (FLONASE) 50 MCG/ACT nasal spray, Place 1 spray into both nostrils daily. (Patient not taking: Reported on 11/06/2022), Disp: 16 g, Rfl: 2   hydrocortisone (ANUSOL-HC) 2.5 %  rectal cream, Apply rectally 2 times daily (Patient not taking: Reported on 03/03/2022), Disp: 28.35 g, Rfl: 0   nitroGLYCERIN (NITROSTAT) 0.4 MG SL tablet, Place 1 tablet (0.4 mg total) under the tongue every 5 (five) minutes as needed for chest pain (CP or SOB). (Patient not taking: Reported on 03/03/2022), Disp: 10 tablet, Rfl: 0   pantoprazole (PROTONIX) 40 MG tablet, Take 1 tablet (40 mg total) by mouth daily at 6 (six) AM. (Patient not taking: Reported on 03/03/2022), Disp: 30 tablet, Rfl:  1   Exam: Current vital signs: BP (!) 164/78   Pulse 72   Temp 97.7 F (36.5 C)   Resp 18   Ht '5\' 3"'$  (1.6 m)   Wt (!) 163.3 kg   LMP 10/20/2022 (Approximate)   SpO2 100%   BMI 63.77 kg/m  Vital signs in last 24 hours: Temp:  [97.7 F (36.5 C)-98.9 F (37.2 C)] 97.7 F (36.5 C) (02/23 1550) Pulse Rate:  [70-88] 72 (02/23 1550) Resp:  [18-25] 18 (02/23 1500) BP: (145-187)/(67-98) 164/78 (02/23 1500) SpO2:  [95 %-100 %] 100 % (02/23 1550) Weight:  [163.3 kg] 163.3 kg (02/23 0702)  GENERAL: Awake, obese, African American female, alert in NAD HEENT: - Normocephalic and atraumatic, dry mm, no LN++, no Thyromegally LUNGS - Clear to auscultation bilaterally with no wheezes CV - S1S2 RRR, no m/r/g, equal pulses bilaterally. ABDOMEN - Soft, nontender, nondistended with normoactive BS Ext: warm, well perfused, intact peripheral pulses, no edema  NEURO:  Mental Status: AA&Ox3  Language: speech is clear.  Naming, repetition, fluency, and comprehension intact. Cranial Nerves: PERRL, EOMI, visual fields full, slight left nasolabial fold flattening, eyebrow raise symmetric, facial sensation intact (states light touch is equal bilaterally to attending states reduced sensation on the left face), hearing intact, tongue/uvula/soft palate midline, normal sternocleidomastoid and trapezius muscle strength. No evidence of tongue atrophy or fasciculations Motor: Slight drift in left leg      Right  Left Shoulder Abduction  5/5  5/5 Elbow Flexion   5/5  4+/5 Elbow Extension  5/5  4+/5 Hand Grasp   5/5  4/5  Hip Flexion   5/5  4/5 Knee Flexion   5/5  4+/5 Knee Extension  5/5  4+/5 Ankle Dorsiflexion  5/5  5/5 Ankle Plantarflexion  5/5  5/5       Giveway quality of weakness on the left side  Tone: is normal and bulk is normal Sensation- Intact to light touch bilaterally Coordination: FTN intact bilaterally, no ataxia in BLE. Gait- deferred for patient safety. She was feeling off balance  when standing.  On attending eval, ambulated safely to restroom, gait not hemiparetic   NIHSS components Score: Comment  1a Level of Conscious 0'[x]'$  1'[]'$  2'[]'$  3'[]'$         1b LOC Questions 0'[x]'$  1'[]'$  2'[]'$           1c LOC Commands 0'[x]'$  1'[]'$  2'[]'$           2 Best Gaze 0'[x]'$  1'[]'$  2'[]'$           3 Visual 0'[x]'$  1'[]'$  2'[]'$  3'[]'$         4 Facial Palsy 0'[]'$  1'[x]'$  2'[]'$  3'[]'$         5a Motor Arm - left 0'[x]'$  1'[]'$  2'[]'$  3'[]'$  4'[]'$  UN'[]'$     5b Motor Arm - Right 0'[x]'$  1'[]'$  2'[]'$  3'[]'$  4'[]'$  UN'[]'$     6a Motor Leg - Left 0'[]'$  1'[x]'$  2'[]'$  3'[]'$  4'[]'$  UN'[]'$     6b Motor Leg - Right 0'[x]'$  1'[]'$  2'[]'$   3$'[]'k$  4'[]'$  UN'[]'$     7 Limb Ataxia 0'[x]'$  1'[]'$  2'[]'$  3'[]'$  UN'[]'$       8 Sensory 0'[x]'$  1'[]'$  2'[]'$  UN'[]'$         9 Best Language 0'[x]'$  1'[]'$  2'[]'$  3'[]'$         10 Dysarthria 0'[x]'$  1'[]'$  2'[]'$  UN'[]'$         11 Extinct. and Inattention 0'[x]'$  1'[]'$  2'[]'$           TOTAL: 2        Labs I have reviewed labs in epic and the results pertinent to this consultation are:   CBC    Component Value Date/Time   WBC 8.5 11/06/2022 0728   RBC 4.37 11/06/2022 0728   HGB 10.6 (L) 11/06/2022 0728   HCT 33.7 (L) 11/06/2022 0728   PLT 352 11/06/2022 0728   MCV 77.1 (L) 11/06/2022 0728   MCH 24.3 (L) 11/06/2022 0728   MCHC 31.5 11/06/2022 0728   RDW 16.5 (H) 11/06/2022 0728   LYMPHSABS 3.6 11/06/2022 0728   MONOABS 0.8 11/06/2022 0728   EOSABS 0.1 11/06/2022 0728   BASOSABS 0.0 11/06/2022 0728    CMP     Component Value Date/Time   NA 135 11/06/2022 0727   K 3.5 11/06/2022 0727   CL 105 11/06/2022 0727   CO2 24 11/06/2022 0727   GLUCOSE 116 (H) 11/06/2022 0727   BUN 15 11/06/2022 0727   CREATININE 0.98 11/06/2022 0727   CALCIUM 8.3 (L) 11/06/2022 0727   PROT 7.0 11/06/2022 0727   ALBUMIN 3.3 (L) 11/06/2022 0727   AST 20 11/06/2022 0727   ALT 17 11/06/2022 0727   ALKPHOS 110 11/06/2022 0727   BILITOT 0.5 11/06/2022 0727   GFRNONAA >60 11/06/2022 0727   GFRAA >60 12/16/2018 0057   Lab Results  Component Value Date   HGBA1C 5.8 (H) 04/04/2011    Lab Results  Component Value Date   CHOL  267 (H) 01/13/2017   HDL 75 01/13/2017   LDLCALC 174 (H) 01/13/2017   TRIG 90 01/13/2017   CHOLHDL 3.6 01/13/2017      Imaging I have reviewed the images obtained:  CT-head/ CTA Head and Neck 1. No acute intracranial pathology on initial noncontrast head CT. 2. Nondiagnostic CTA head/neck due to bolus timing and body habitus.  MRI examination of the brain  Assessment:  52 y.o. female presenting with with chest pressure, left sided weakness, and facial numbness in the left and tongue that initially started 2/22 in the afternoon. 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 She states that the chest pressure was still present when she woke up this morning which prompted her to come to the emergency department. She states on her prior TIA she did have some left-sided paresthesias. She is currently reporting left facial paresthesias and left sided weakness. She appears to have slight left nasolabial fold flattening as well. She endorses chest pressure but denies pain. EKG is NSR. She is waiting for an MRI and then will be admitted to the hospitalist service for TIA/stroke work up.   Impression: Differential includes stuttering lacunar infarct, complex migraine (favored given the stereotypical nature of her events with headache and left-sided weakness)  Recommendations:  # Lacunar stroke vs. Complex migraine - MRI brain without contrast  if body habitus allows, otherwise repeat Head CT tomorrow  - Hgb A1c and lipid panel - Frequent neuro checks - Echocardiogram - Prophylactic therapy-Antiplatelet med: ASA '81mg'$  and plavix '75mg'$  could TBD based - Allow for permissive HTN up to 220 for 3 days, long term goal is normotension - Risk factor modification - Telemetry  monitoring - PT consult, OT consult, Speech consult - Stroke team to follow  # Headache - One dose of ketorolac 15 mg and benadryl 25 mg for now  Patient seen and examined by NP/APP with MD. MD to update note as needed.   Janine Ores, DNP, FNP-BC [facility] Pager: [phone]  Attending Neurologist's note:  I personally saw this patient, gathering history, performing a full neurologic examination, reviewing relevant labs, personally reviewing relevant imaging including head CT and CTA, and formulated the assessment and plan, adding the note above for completeness and clarity to accurately reflect my thoughts  Lesleigh Noe MD-PhD [facility] [phone] Available 7 AM to 7 PM, outside these hours please contact Neurologist on call listed on AMION

## 2022-11-06 NOTE — ED Notes (Signed)
Called [facility] for transport for MRI to [facility] ED talked to Lauren at 11:10

## 2022-11-06 NOTE — ED Notes (Signed)
Pt ambulatory to [facility]

## 2022-11-06 NOTE — Assessment & Plan Note (Addendum)
Allow for permissive HTN Has not been on medication in years. Ran out and never refilled  PRN IV with parameters

## 2022-11-06 NOTE — Assessment & Plan Note (Addendum)
Not on statin. Ran out years ago and never refilled Start back lipitor '40mg'$ , f/u on lipid panel

## 2022-11-07 ENCOUNTER — Encounter (HOSPITAL_COMMUNITY): Payer: Self-pay | Admitting: Family Medicine

## 2022-11-07 ENCOUNTER — Observation Stay (HOSPITAL_COMMUNITY): Payer: Medicaid Other

## 2022-11-07 ENCOUNTER — Observation Stay (HOSPITAL_BASED_OUTPATIENT_CLINIC_OR_DEPARTMENT_OTHER): Payer: Medicaid Other

## 2022-11-07 DIAGNOSIS — I10 Essential (primary) hypertension: Secondary | ICD-10-CM

## 2022-11-07 DIAGNOSIS — R2990 Unspecified symptoms and signs involving the nervous system: Secondary | ICD-10-CM | POA: Diagnosis not present

## 2022-11-07 DIAGNOSIS — R079 Chest pain, unspecified: Secondary | ICD-10-CM

## 2022-11-07 LAB — LIPID PANEL
Cholesterol: 263 mg/dL — ABNORMAL HIGH (ref 0–200)
HDL: 73 mg/dL (ref >40)
LDL Cholesterol: 174 mg/dL — ABNORMAL HIGH (ref 0–99)
Total CHOL/HDL Ratio: 3.6 RATIO
Triglycerides: 80 mg/dL (ref <150)
VLDL: 16 mg/dL (ref 0–40)

## 2022-11-07 LAB — BASIC METABOLIC PANEL
Anion gap: 11 (ref 5–15)
BUN: 11 mg/dL (ref 6–20)
CO2: 25 mmol/L (ref 22–32)
Calcium: 8.4 mg/dL — ABNORMAL LOW (ref 8.9–10.3)
Chloride: 100 mmol/L (ref 98–111)
Creatinine, Ser: 0.83 mg/dL (ref 0.44–1.00)
GFR, Estimated: >60 mL/min (ref >60)
Glucose, Bld: 87 mg/dL (ref 70–99)
Potassium: 3.2 mmol/L — ABNORMAL LOW (ref 3.5–5.1)
Sodium: 136 mmol/L (ref 135–145)

## 2022-11-07 LAB — CBC
HCT: 33 % — ABNORMAL LOW (ref 36.0–46.0)
Hemoglobin: 10.1 g/dL — ABNORMAL LOW (ref 12.0–15.0)
MCH: 23.8 pg — ABNORMAL LOW (ref 26.0–34.0)
MCHC: 30.6 g/dL (ref 30.0–36.0)
MCV: 77.8 fL — ABNORMAL LOW (ref 80.0–100.0)
Platelets: 359 10*3/uL (ref 150–400)
RBC: 4.24 MIL/uL (ref 3.87–5.11)
RDW: 16.7 % — ABNORMAL HIGH (ref 11.5–15.5)
WBC: 6 10*3/uL (ref 4.0–10.5)
nRBC: 0 % (ref 0.0–0.2)

## 2022-11-07 LAB — ECHOCARDIOGRAM COMPLETE
AR max vel: 3.45 cm2
AV Area VTI: 3.5 cm2
AV Area mean vel: 3.39 cm2
AV Mean grad: 7 mmHg
AV Peak grad: 13.1 mmHg
Ao pk vel: 1.81 m/s
Area-P 1/2: 3.26 cm2
Calc EF: 65.2 %
Height: 63 in
S' Lateral: 3.4 cm
Single Plane A2C EF: 68 %
Single Plane A4C EF: 65.7 %
Weight: 6186.99 oz

## 2022-11-07 LAB — IRON AND TIBC
Iron: 42 ug/dL (ref 28–170)
Saturation Ratios: 10 % — ABNORMAL LOW (ref 10.4–31.8)
TIBC: 414 ug/dL (ref 250–450)
UIBC: 372 ug/dL

## 2022-11-07 LAB — HEMOGLOBIN A1C
Hgb A1c MFr Bld: 5.9 % — ABNORMAL HIGH (ref 4.8–5.6)
Mean Plasma Glucose: 122.63 mg/dL

## 2022-11-07 LAB — HIV ANTIBODY (ROUTINE TESTING W REFLEX): HIV Screen 4th Generation wRfx: NONREACTIVE

## 2022-11-07 LAB — FERRITIN: Ferritin: 11 ng/mL (ref 11–307)

## 2022-11-07 LAB — TSH: TSH: 3.777 u[IU]/mL (ref 0.350–4.500)

## 2022-11-07 MED ORDER — KETOROLAC TROMETHAMINE 15 MG/ML IJ SOLN
15.0000 mg | Freq: Four times a day (QID) | INTRAMUSCULAR | Status: DC | PRN
  Administered 2022-11-07: 15 mg via INTRAVENOUS
  Filled 2022-11-07 (×2): qty 1

## 2022-11-07 MED ORDER — ASPIRIN 81 MG PO CHEW
81.0000 mg | CHEWABLE_TABLET | Freq: Every day | ORAL | Status: DC
  Administered 2022-11-07 – 2022-11-08 (×2): 81 mg via ORAL
  Filled 2022-11-07 (×2): qty 1

## 2022-11-07 MED ORDER — ASPIRIN 81 MG PO CHEW: 81.0000 mg | CHEWABLE_TABLET | Freq: Every day | ORAL | 1 refills | Status: DC

## 2022-11-07 MED ORDER — POTASSIUM CHLORIDE CRYS ER 20 MEQ PO TBCR
40.0000 meq | EXTENDED_RELEASE_TABLET | Freq: Once | ORAL | Status: AC
  Administered 2022-11-07: 40 meq via ORAL
  Filled 2022-11-07: qty 2

## 2022-11-07 MED ORDER — PNEUMOCOCCAL 20-VAL CONJ VACC 0.5 ML IM SUSY
0.5000 mL | PREFILLED_SYRINGE | INTRAMUSCULAR | Status: AC
  Administered 2022-11-08: 0.5 mL via INTRAMUSCULAR
  Filled 2022-11-07: qty 0.5

## 2022-11-07 MED ORDER — INFLUENZA VAC A&B SA ADJ QUAD 0.5 ML IM PRSY
0.5000 mL | PREFILLED_SYRINGE | INTRAMUSCULAR | Status: AC
  Administered 2022-11-08: 0.5 mL via INTRAMUSCULAR
  Filled 2022-11-07: qty 0.5

## 2022-11-07 MED ORDER — KETOROLAC TROMETHAMINE 15 MG/ML IJ SOLN
15.0000 mg | INTRAMUSCULAR | Status: AC
  Administered 2022-11-07: 15 mg via INTRAVENOUS
  Filled 2022-11-07: qty 1

## 2022-11-07 MED ORDER — IOHEXOL 350 MG/ML SOLN
75.0000 mL | Freq: Once | INTRAVENOUS | Status: AC | PRN
  Administered 2022-11-07: 75 mL via INTRAVENOUS

## 2022-11-07 MED ORDER — CLOPIDOGREL BISULFATE 75 MG PO TABS
75.0000 mg | ORAL_TABLET | Freq: Every day | ORAL | Status: DC
  Administered 2022-11-07 – 2022-11-08 (×2): 75 mg via ORAL
  Filled 2022-11-07 (×2): qty 1

## 2022-11-07 MED ORDER — CLOPIDOGREL BISULFATE 75 MG PO TABS: 75.0000 mg | ORAL_TABLET | Freq: Every day | ORAL | 0 refills | Status: DC

## 2022-11-07 MED ORDER — ATORVASTATIN CALCIUM 40 MG PO TABS: 40.0000 mg | ORAL_TABLET | Freq: Every day | ORAL | 1 refills | Status: DC

## 2022-11-07 MED ORDER — DIPHENHYDRAMINE HCL 50 MG/ML IJ SOLN
12.5000 mg | INTRAMUSCULAR | Status: AC
  Administered 2022-11-07: 12.5 mg via INTRAVENOUS
  Filled 2022-11-07: qty 1

## 2022-11-07 MED ORDER — NITROGLYCERIN 0.4 MG SL SUBL: 0.4000 mg | SUBLINGUAL_TABLET | SUBLINGUAL | 0 refills | Status: AC | PRN

## 2022-11-07 NOTE — Progress Notes (Signed)
The pt is c/o headache unrelieved with Tylenol. Report pain level of 10/10 on a pain scale. Notified Dr. Nevada Crane and received headache cocktail as indicated in the [facility]. Will continue to monitor.

## 2022-11-07 NOTE — Discharge Summary (Signed)
Physician Discharge Summary  Terri Boyd [id number] DOB: [birth year] DOA: 11/06/2022  PCP: [facility]  Admit date: 11/06/2022 Discharge date: 11/07/2022  Admitted From: Home Disposition: Home  Recommendations for Outpatient Follow-up:  Follow up with PCP in 1 week  Outpatient follow-up with neurology Follow up in ED if symptoms worsen or new appear   Home Health: No Equipment/Devices: None  Discharge Condition: Stable CODE STATUS: Full Diet recommendation: Heart healthy  Brief/Interim Summary: 52 y.o. female with medical history significant of asthma, hx of DVT, HTN, HLD, hx of TIA presented with left-sided weakness/facial numbness along with chest pressure.  On presentation, CTA of head and neck were negative for acute abnormality.  Neurology was consulted.  Patient could not undergo MRI of her brain because of her body habitus.  Repeat CTA was done as per neurology recommendations which did not show any acute stroke.  Neurology has cleared the patient for discharge on aspirin and Plavix along with statin with outpatient follow-up with neurology.  She will need outpatient PT/OT.  Discharge patient home today.  Discharge Diagnoses:   Possible TIA Hyperlipidemia -Presented with intermittent left-sided numbness and weakness. -CTA of head and neck were negative for acute abnormality.  Neurology was consulted.  Patient could not undergo MRI of her brain because of her body habitus.  Repeat CTA was done as per neurology recommendations which did not show any acute stroke as per neurology review of the study (official report pending).  Neurology has cleared the patient for discharge today after echo on aspirin and Plavix for 3 weeks and then aspirin alone; along with statin with outpatient follow-up with neurology.  She will need outpatient PT/OT.  Discharge patient home today. -LDL 174.  A1c 5.9.  Possibly migraine -Improving.  Chest pain -Improved.  Troponin x 3  within normal limit with normal EKG.  Microcytic anemia -Hemoglobin stable.  Essential hypertension -Blood pressure intermittently on the higher side.  Will need outpatient follow-up with PCP regarding management of same.  Morbid obesity -Outpatient follow-up  Discharge Instructions  Discharge Instructions     Ambulatory referral to Occupational Therapy   Complete by: As directed    Ambulatory referral to Physical Therapy   Complete by: As directed    Diet - low sodium heart healthy   Complete by: As directed    Increase activity slowly   Complete by: As directed       Allergies as of 11/07/2022   No Known Allergies      Medication List     STOP taking these medications    albuterol 108 (90 Base) MCG/ACT inhaler Commonly known as: VENTOLIN HFA   amLODipine 5 MG tablet Commonly known as: NORVASC   chlorthalidone 25 MG tablet Commonly known as: HYGROTON   fluticasone 50 MCG/ACT nasal spray Commonly known as: FLONASE   hydrocortisone 2.5 % rectal cream Commonly known as: Anusol-HC   pantoprazole 40 MG tablet Commonly known as: PROTONIX       TAKE these medications    aspirin 81 MG chewable tablet Chew 1 tablet (81 mg total) by mouth daily. Start taking on: November 08, 2022   aspirin-acetaminophen-caffeine 250-250-65 MG tablet Commonly known as: EXCEDRIN MIGRAINE Take 2 tablets by mouth as needed for headache or migraine.   atorvastatin 40 MG tablet Commonly known as: LIPITOR Take 1 tablet (40 mg total) by mouth daily at 6 PM.   clopidogrel 75 MG tablet Commonly known as: PLAVIX Take 1 tablet (75 mg total) by  mouth daily. Start taking on: November 08, 2022   furosemide 20 MG tablet Commonly known as: LASIX Take 20 mg by mouth as needed for fluid or edema.   nitroGLYCERIN 0.4 MG SL tablet Commonly known as: NITROSTAT Place 1 tablet (0.4 mg total) under the tongue every 5 (five) minutes as needed for chest pain (CP or SOB).        No  Known Allergies  Consultations: Neurology   Procedures/Studies: CT Angio Head Neck W WO CM  Result Date: 11/06/2022 CLINICAL DATA:  Left facial numbness and weakness beginning yesterday afternoon. EXAM: CT ANGIOGRAPHY HEAD AND NECK TECHNIQUE: Multidetector CT imaging of the head and neck was performed using the standard protocol during bolus administration of intravenous contrast. Multiplanar CT image reconstructions and MIPs were obtained to evaluate the vascular anatomy. Carotid stenosis measurements (when applicable) are obtained utilizing NASCET criteria, using the distal internal carotid diameter as the denominator. RADIATION DOSE REDUCTION: This exam was performed according to the departmental dose-optimization program which includes automated exposure control, adjustment of the mA and/or kV according to patient size and/or use of iterative reconstruction technique. CONTRAST:  67m OMNIPAQUE IOHEXOL 350 MG/ML SOLN COMPARISON:  Brain MRI 08/17/2020 FINDINGS: CT HEAD FINDINGS Brain: There is no acute intracranial hemorrhage, extra-axial fluid collection, or acute infarct. Parenchymal volume is normal. The ventricles are normal in size. Gray-white differentiation is preserved. The pituitary and suprasellar region are normal. There is no mass lesion. There is no mass effect or midline shift. Vascular: See below. Skull: Normal. Negative for fracture or focal lesion. Sinuses/Orbits: The paranasal sinuses are clear. The mastoid air cells are clear. The globes and orbits are unremarkable. Other: None. Review of the MIP images confirms the above findings CTA NECK FINDINGS Evaluation of the arterial vasculature is essentially nondiagnostic due to bolus timing. There is mild calcified plaque of the carotid bifurcations. The degree of stenosis can not be assessed. Patency of the vertebral arteries can not be assessed. Skeleton: There is no acute osseous abnormality or suspicious osseous lesion. There is no  visible canal hematoma. 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 Other neck: The soft tissues of the neck are unremarkable. Upper chest: The imaged lung apices are clear. Review of the MIP images confirms the above findings CTA HEAD FINDINGS Evaluation of the intracranial arterial vasculature is markedly limited by bolus timing. Anterior circulation: Intracranial ICAs appear grossly patent with calcified plaque. The bilateral M1 segments appear patent. The distal branches cannot be assessed. The bilateral A1 segments are patent. The distal branches cannot be assessed. Aneurysm can not be excluded. Posterior circulation: The posterior circulation cannot be assessed. Venous sinuses: Patent. Review of the MIP images confirms the above findings IMPRESSION: 1. No acute intracranial pathology on initial noncontrast head CT. 2. Nondiagnostic CTA head/neck due to bolus timing and body habitus. Recommend repeat study or MRA if there is persistent clinical concern. Electronically Signed   By: PValetta MoleM.D.   On: 11/06/2022 08:42   DG Chest 2 View  Result Date: 11/06/2022 CLINICAL DATA:  Chest pressure and shortness of breath on exertion EXAM: CHEST - 2 VIEW COMPARISON:  Chest radiograph dated 01/22/2021 FINDINGS: Normal lung volumes. No focal consolidations. No pleural effusion or pneumothorax. Similar cardiomediastinal silhouette. The visualized skeletal structures are unremarkable. IMPRESSION: No active cardiopulmonary disease. Electronically Signed   By: LDarrin NipperM.D.   On: 11/06/2022 08:34  Echo pending  Subjective: Patient seen and examined at bedside.  Left-sided weakness is slightly better.  No fever or vomiting reported.  Denies any current chest pain. Discharge Exam: Vitals:   11/07/22 0822 11/07/22 1150  BP: (!) 157/76 (!) 149/89  Pulse: 70 74  Resp: 16 14  Temp: 98.5 F (36.9 C) 98.3 F (36.8 C)  SpO2: 98%  97%    General: Pt is alert, awake, not in acute distress. Cardiovascular: rate controlled, S1/S2 + Respiratory: bilateral decreased breath sounds at bases Abdominal: Soft, morbidly obese, NT, ND, bowel sounds + Extremities: Trace lower extremity edema; no cyanosis    The results of significant diagnostics from this hospitalization (including imaging, microbiology, ancillary and laboratory) are listed below for reference.     Microbiology: No results found for this or any previous visit (from the past 240 hour(s)).   Labs: BNP (last 3 results) Recent Labs    11/06/22 0728  BNP 0000000   Basic Metabolic Panel: Recent Labs  Lab 11/06/22 0727 11/07/22 0430  NA 135 136  K 3.5 3.2*  CL 105 100  CO2 24 25  GLUCOSE 116* 87  BUN 15 11  CREATININE 0.98 0.83  CALCIUM 8.3* 8.4*   Liver Function Tests: Recent Labs  Lab 11/06/22 0727  AST 20  ALT 17  ALKPHOS 110  BILITOT 0.5  PROT 7.0  ALBUMIN 3.3*   No results for input(s): "LIPASE", "AMYLASE" in the last 168 hours. No results for input(s): "AMMONIA" in the last 168 hours. CBC: Recent Labs  Lab 11/06/22 0728 11/07/22 0430  WBC 8.5 6.0  NEUTROABS 4.0  --   HGB 10.6* 10.1*  HCT 33.7* 33.0*  MCV 77.1* 77.8*  PLT 352 359   Cardiac Enzymes: No results for input(s): "CKTOTAL", "CKMB", "CKMBINDEX", "TROPONINI" in the last 168 hours. BNP: Invalid input(s): "POCBNP" CBG: No results for input(s): "GLUCAP" in the last 168 hours. D-Dimer No results for input(s): "DDIMER" in the last 72 hours. Hgb A1c Recent Labs    11/07/22 0430  HGBA1C 5.9*   Lipid Profile Recent Labs    11/07/22 0430  CHOL 263*  HDL 73  LDLCALC 174*  TRIG 80  CHOLHDL 3.6   Thyroid function studies Recent Labs    11/07/22 0430  TSH 3.777   Anemia work up Recent Labs    11/07/22 0430  FERRITIN 11  TIBC 414  IRON 42   Urinalysis    Component Value Date/Time   COLORURINE YELLOW 03/20/2019 2201   APPEARANCEUR CLEAR 03/20/2019  2201   LABSPEC >1.030 (H) 03/20/2019 2201   PHURINE 6.0 03/20/2019 2201   GLUCOSEU NEGATIVE 03/20/2019 2201   HGBUR TRACE (A) 03/20/2019 2201   BILIRUBINUR NEGATIVE 03/20/2019 2201   KETONESUR NEGATIVE 03/20/2019 2201   PROTEINUR NEGATIVE 03/20/2019 2201   UROBILINOGEN 1.0 10/04/2013 1942   NITRITE NEGATIVE 03/20/2019 2201   LEUKOCYTESUR NEGATIVE 03/20/2019 2201   Sepsis Labs Recent Labs  Lab 11/06/22 0728 11/07/22 0430  WBC 8.5 6.0   Microbiology No results found for this or any previous visit (from the past 240 hour(s)).   Time coordinating discharge: 35 minutes  SIGNED:   Aline August, MD  [facility] 11/07/2022, 2:20 PM

## 2022-11-07 NOTE — Progress Notes (Signed)
The patient is admitted from ED to 3 W 05. A & x 4. Patient is oriented to staff. Ascom/call bell. Full assessment to epic completed with the help of the virtual RN. Patient voiced concern at this time. Will continue to monitor.

## 2022-11-07 NOTE — Evaluation (Addendum)
Physical Therapy Evaluation  Patient Details Name: Terri Boyd MRN: [id number] DOB: 1971-04-01 Today's Date: 11/07/2022  History of Present Illness  Pt is a 52 y.o. F who presents 11/06/2022 with chest pressure, left sided weakness, and facial numbness. Significant PMH: asthma, obesity, migrain, HTN, DVT, TIAs.  Clinical Impression  PTA, pt lives with her parents and older son in a house with 5 steps to enter. 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 Pt presents with decreased gait speed and activity tolerance in compared to her baseline. No strength deficits appreciated in BLE's. Pt reports headache and bilateral gastroc pain which worsens with weightbearing. Pt ambulating ~75 ft with no assistive device or physical assist; further distance limited by fatigue, pain, and pt reports of feeling "hot." Gait speed of 0.97 ft/s, which pt states is significantly slowed compared to baseline. Will benefit from follow up OPPT to address deficits and maximize functional mobility.      Recommendations for follow up therapy are one component of a multi-disciplinary discharge planning process, led by the attending physician.  Recommendations may be updated based on patient status, additional functional criteria and insurance authorization.  Follow Up Recommendations Outpatient PT      Assistance Recommended at Discharge Intermittent Supervision/Assistance  Patient can return home with the following  Assistance with cooking/housework;Assist for transportation;Help with stairs or ramp for entrance    Equipment Recommendations None recommended by PT  Recommendations for Other Services       Functional Status Assessment Patient has had a recent decline in their functional status and demonstrates the ability to make significant improvements in function in a reasonable and predictable amount of time.     Precautions / Restrictions Precautions Precautions: None Restrictions Weight  Bearing Restrictions: No      Mobility  Bed Mobility Overal bed mobility: Modified Independent                  Transfers Overall transfer level: Independent Equipment used: None                    Ambulation/Gait Ambulation/Gait assistance: Modified independent (Device/Increase time) Gait Distance (Feet): 75 Feet Assistive device: None Gait Pattern/deviations: Step-through pattern, Decreased stride length Gait velocity: 0.97 ft/s Gait velocity interpretation: <1.31 ft/sec, indicative of household ambulator   General Gait Details: Reaching out intermittently for external support, but no gross unsteadiness appreciated, equal step length, decreased stride length, slower speed  Stairs            Wheelchair Mobility    Modified Rankin (Stroke Patients Only) Modified Rankin (Stroke Patients Only) Pre-Morbid Rankin Score: No symptoms Modified Rankin: Moderate disability     Balance Overall balance assessment: Mild deficits observed, not formally tested                                           Pertinent Vitals/Pain Pain Assessment Pain Assessment: Faces Faces Pain Scale: Hurts little more Pain Location: Bilateral gastroc, worsening with WB Pain Descriptors / Indicators: Discomfort, Grimacing Pain Intervention(s): Limited activity within patient's tolerance, Monitored during session    Home Living Family/patient expects to be discharged to:: Private residence Living Arrangements: Parent;Children (older child, elderly parents with dementia) Available Help at Discharge: Family Type of Home: House Home Access: Stairs to enter Entrance Stairs-Rails: Left Entrance Stairs-Number of Steps: 5   Home Layout: Able  to live on main level with bedroom/bathroom Home Equipment: None (shower bench)      Prior Function Prior Level of Function : Independent/Modified Independent             Mobility Comments: full time caregiver;  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, ambulating, ADL's       Hand Dominance        Extremity/Trunk Assessment   Upper Extremity Assessment Upper Extremity Assessment: Defer to OT evaluation    Lower Extremity Assessment Lower Extremity Assessment: RLE deficits/detail;LLE deficits/detail RLE Deficits / Details: Strength 5/5 LLE Deficits / Details: Strength 5/5    Cervical / Trunk Assessment Cervical / Trunk Assessment: Other exceptions Cervical / Trunk Exceptions: obesity  Communication   Communication: No difficulties  Cognition Arousal/Alertness: Awake/alert Behavior During Therapy: WFL for tasks assessed/performed Overall Cognitive Status: Within Functional Limits for tasks assessed                                          General Comments      Exercises     Assessment/Plan    PT Assessment Patient needs continued PT services  PT Problem List Decreased activity tolerance;Decreased mobility;Pain       PT Treatment Interventions DME instruction;Gait training;Stair training;Functional mobility training;Therapeutic activities;Balance training;Therapeutic exercise;Patient/family education    PT Goals (Current goals can be found in the Care Plan section)  Acute Rehab PT Goals Patient Stated Goal: return to independence PT Goal Formulation: With patient Time For Goal Achievement: 11/21/22 Potential to Achieve Goals: Good    Frequency Min 3X/week     Co-evaluation               AM-PAC PT "6 Clicks" Mobility  Outcome Measure Help needed turning from your back to your side while in a flat bed without using bedrails?: None Help needed moving from lying on your back to sitting on the side of a flat bed without using bedrails?: None Help needed moving to and from a bed to a chair (including a wheelchair)?: None Help needed standing up from a chair using your arms (e.g., wheelchair or bedside chair)?: None Help needed to walk in hospital  room?: None Help needed climbing 3-5 steps with a railing? : A Little 6 Click Score: 23    End of Session   Activity Tolerance: Patient tolerated treatment well Patient left: in bed;with call bell/phone within reach Nurse Communication: Mobility status;Other (comment) (ind. to bathroom) PT Visit Diagnosis: Difficulty in walking, not elsewhere classified (R26.2)    Time: [id number] PT Time Calculation (min) (ACUTE ONLY): 18 min   Charges:   PT Evaluation $PT Eval Low Complexity: Gardner, PT, DPT Acute Rehabilitation Services Office [phone]   Deno Etienne 11/07/2022, 9:14 AM

## 2022-11-07 NOTE — Progress Notes (Signed)
Dr. Nevada Crane responded, "please follow order from primary team."

## 2022-11-07 NOTE — Evaluation (Signed)
Occupational Therapy Evaluation Patient Details Name: Terri Boyd MRN: [id number] DOB: 1971/05/15 Today's Date: 11/07/2022   History of Present Illness Pt is a 52 y.o. F who presents 11/06/2022 with chest pressure, left sided weakness, and facial numbness. Significant PMH: asthma, obesity, migrain, HTN, DVT, TIAs.   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 Pt needing min guard-mod A for ADLs, mod I for bed mobility and transfers without AD. Pt reports LUE back to baseline without sensory changes, however still reports L facial numbness. Pt presenting with impairments listed below, will follow acutely. Recommend OP OT at d/c.      Recommendations for follow up therapy are one component of a multi-disciplinary discharge planning process, led by the attending physician.  Recommendations may be updated based on patient status, additional functional criteria and insurance authorization.   Follow Up Recommendations  Outpatient OT     Assistance Recommended at Discharge Intermittent Supervision/Assistance  Patient can return home with the following A little help with walking and/or transfers;A little help with bathing/dressing/bathroom;Assistance with cooking/housework;Direct supervision/assist for medications management;Direct supervision/assist for financial management;Assist for transportation;Help with stairs or ramp for entrance    Functional Status Assessment  Patient has had a recent decline in their functional status and demonstrates the ability to make significant improvements in function in a reasonable and predictable amount of time.  Equipment Recommendations  None recommended by OT    Recommendations for Other Services PT consult     Precautions / Restrictions Precautions Precautions: None Restrictions Weight Bearing  Restrictions: No      Mobility Bed Mobility Overal bed mobility: Modified Independent                  Transfers Overall transfer level: Modified independent                        Balance Overall balance assessment: Mild deficits observed, not formally tested                                         ADL either performed or assessed with clinical judgement   ADL Overall ADL's : Needs assistance/impaired Eating/Feeding: Set up;Sitting   Grooming: Min guard   Upper Body Bathing: Min guard;Sitting   Lower Body Bathing: Moderate assistance;Sitting/lateral leans   Upper Body Dressing : Min guard;Sitting   Lower Body Dressing: Moderate assistance;Sitting/lateral leans   Toilet Transfer: Min guard;Ambulation;Regular Museum/gallery exhibitions officer and Hygiene: Minimal assistance       Functional mobility during ADLs: Min guard       Vision   Vision Assessment?: No apparent visual deficits     Perception Perception Perception Tested?: No   Praxis Praxis Praxis tested?: Not tested    Pertinent Vitals/Pain Pain Assessment Pain Assessment: Faces Pain Score: 4  Faces Pain Scale: Hurts little more Pain Location: Bilateral gastroc, worsening with WB Pain Descriptors / Indicators: Discomfort, Grimacing Pain Intervention(s): Limited activity within patient's tolerance, Monitored during session, Repositioned     Hand Dominance     Extremity/Trunk Assessment Upper Extremity Assessment Upper Extremity Assessment: Generalized weakness   Lower Extremity Assessment Lower Extremity Assessment: Defer to PT evaluation RLE Deficits / Details: Strength 5/5 LLE Deficits / Details: Strength  5/5   Cervical / Trunk Assessment Cervical / Trunk Assessment: Other exceptions Cervical / Trunk Exceptions: body habitus   Communication Communication Communication: No difficulties   Cognition Arousal/Alertness: Awake/alert Behavior  During Therapy: WFL for tasks assessed/performed Overall Cognitive Status: Within Functional Limits for tasks assessed                                       General Comments  SpO2 97-98% on RA    Exercises     Shoulder Instructions      Home Living Family/patient expects to be discharged to:: Private residence Living Arrangements: Parent;Children (parents with dementia and older child) Available Help at Discharge: Family Type of Home: House Home Access: Stairs to enter Technical brewer of Steps: 5 Entrance Stairs-Rails: Left Home Layout: Able to live on main level with bedroom/bathroom     Bathroom Shower/Tub: Walk-in shower         Home Equipment: Tub bench          Prior Functioning/Environment Prior Level of Function : Independent/Modified Independent             Mobility Comments: full time caregiver; 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, ambulating, ADL's ADLs Comments: ind with all ADLs        OT Problem List: Decreased strength;Decreased range of motion;Decreased activity tolerance;Impaired balance (sitting and/or standing)      OT Treatment/Interventions: Self-care/ADL training;Therapeutic exercise;Energy conservation;DME and/or AE instruction;Therapeutic activities;Patient/family education;Visual/perceptual remediation/compensation;Balance training;Cognitive remediation/compensation    OT Goals(Current goals can be found in the care plan section) Acute Rehab OT Goals Patient Stated Goal: none stated OT Goal Formulation: With patient Time For Goal Achievement: 11/21/22 Potential to Achieve Goals: Good ADL Goals Pt Will Perform Upper Body Dressing: with modified independence;sitting Pt Will Perform Lower Body Dressing: with supervision;sit to/from stand;sitting/lateral leans Pt Will Transfer to Toilet: ambulating;regular height toilet;with modified independence  OT Frequency: Min 2X/week    Co-evaluation               AM-PAC OT "6 Clicks" Daily Activity     Outcome Measure Help from another person eating meals?: None Help from another person taking care of personal grooming?: None Help from another person toileting, which includes using toliet, bedpan, or urinal?: A Little Help from another person bathing (including washing, rinsing, drying)?: A Little Help from another person to put on and taking off regular upper body clothing?: A Little Help from another person to put on and taking off regular lower body clothing?: A Little 6 Click Score: 20   End of Session Nurse Communication: Mobility status (notified of BLE pain)  Activity Tolerance: Patient tolerated treatment well Patient left: in bed;with call bell/phone within reach;with bed alarm set  OT Visit Diagnosis: Unsteadiness on feet (R26.81);Other abnormalities of gait and mobility (R26.89);Muscle weakness (generalized) (M62.81)                Time: 1141-1200 OT Time Calculation (min): 19 min Charges:  OT General Charges $OT Visit: 1 Visit OT Evaluation $OT Eval Moderate Complexity: 1 Mod  Terri Boyd, OTD, OTR/L [employer] Preferred Acute Rehab [phone]  Terri Boyd 11/07/2022, 12:34 PM

## 2022-11-07 NOTE — ED Notes (Signed)
ED TO INPATIENT HANDOFF REPORT  ED Nurse Name and Phone #:  Theresia Lo A. Jaielle Dlouhy, [place]  S Name/Age/Gender Terri Boyd 52 y.o. female Room/Bed: H020C/H020C  Code Status   Code Status: Full Code  Home/SNF/Other Home Patient oriented to: self, place, time, and situation Is this baseline? Yes   Triage Complete: Triage complete  Chief Complaint Stroke-like symptoms [R29.90]  Triage Note Pt reports chest pressure since yesterday morning with shortness of breath on exertion. Pt reports numbness to left side face and weakness to left side that began yesterday afternoon. Pt reports intermittent pain in bilateral legs as well. Pt reports hx of TIA and hx of DVT. Pt does not take blood thinners.    Allergies No Known Allergies  Level of Care/Admitting Diagnosis ED Disposition     ED Disposition  Admit   Condition  --   Comment  Hospital Area: [facility] [[id number]]  Level of Care: Telemetry Medical [104]  May place patient in observation at [facility] or [place] if equivalent level of care is available:: No  Covid Evaluation: Asymptomatic - no recent exposure (last 10 days) testing not required  Diagnosis: Stroke-like symptoms [id number]  Admitting Physician: Orma Flaming [id number]  Attending Physician: Adora Fridge          B Medical/Surgery History Past Medical History:  Diagnosis Date   Asthma    DVT (deep venous thrombosis) (HCC)    High cholesterol    Hypertension    Migraine    Obesity    Stroke [facility])    TIA (transient ischemic attack)    Past Surgical History:  Procedure Laterality Date   LEFT HEART CATH AND CORONARY ANGIOGRAPHY N/A 01/12/2017   Procedure: Left Heart Cath and Coronary Angiography;  Surgeon: Belva Crome, MD;  Location: [facility];  Service: Cardiovascular;  Laterality: N/A;   TUBAL LIGATION       A IV Location/Drains/Wounds Patient Lines/Drains/Airways Status     Active Line/Drains/Airways      Name Placement date Placement time Site Days   Peripheral IV 11/06/22 20 G Right Antecubital 11/06/22  0759  Antecubital  1            Intake/Output Last 24 hours No intake or output data in the 24 hours ending 11/07/22 0000  Labs/Imaging Results for orders placed or performed during the hospital encounter of 11/06/22 (from the past 48 hour(s))  Troponin I (High Sensitivity)     Status: None   Collection Time: 11/06/22  7:26 AM  Result Value Ref Range   Troponin I (High Sensitivity) 6 <18 ng/L    Comment: (NOTE) Elevated high sensitivity troponin I (hsTnI) values and significant  changes across serial measurements may suggest ACS but many other  chronic and acute conditions are known to elevate hsTnI results.  Refer to the "Links" section for chest pain algorithms and additional  guidance. Performed at [facility], [address] [place], [place] [address]   Comprehensive metabolic panel     Status: Abnormal   Collection Time: 11/06/22  7:27 AM  Result Value Ref Range   Sodium 135 135 - 145 mmol/L   Potassium 3.5 3.5 - 5.1 mmol/L   Chloride 105 98 - 111 mmol/L   CO2 24 22 - 32 mmol/L   Glucose, Bld 116 (H) 70 - 99 mg/dL    Comment: Glucose reference range applies only to samples taken after fasting for at least 8  hours.   BUN 15 6 - 20 mg/dL   Creatinine, Ser 0.98 0.44 - 1.00 mg/dL   Calcium 8.3 (L) 8.9 - 10.3 mg/dL   Total Protein 7.0 6.5 - 8.1 g/dL   Albumin 3.3 (L) 3.5 - 5.0 g/dL   AST 20 15 - 41 U/L   ALT 17 0 - 44 U/L   Alkaline Phosphatase 110 38 - 126 U/L   Total Bilirubin 0.5 0.3 - 1.2 mg/dL   GFR, Estimated >60 >60 mL/min    Comment: (NOTE) Calculated using the CKD-EPI Creatinine Equation (2021)    Anion gap 6 5 - 15    Comment: Performed at [facility], [address] [place], [place] [address]  Brain natriuretic peptide     Status: None   Collection Time: 11/06/22  7:28 AM  Result Value Ref Range   B Natriuretic Peptide  26.8 0.0 - 100.0 pg/mL    Comment: Performed at [facility], [address] [place], [place] [address]  CBC with Differential/Platelet     Status: Abnormal   Collection Time: 11/06/22  7:28 AM  Result Value Ref Range   WBC 8.5 4.0 - 10.5 K/uL   RBC 4.37 3.87 - 5.11 MIL/uL   Hemoglobin 10.6 (L) 12.0 - 15.0 g/dL   HCT 33.7 (L) 36.0 - 46.0 %   MCV 77.1 (L) 80.0 - 100.0 fL   MCH 24.3 (L) 26.0 - 34.0 pg   MCHC 31.5 30.0 - 36.0 g/dL   RDW 16.5 (H) 11.5 - 15.5 %   Platelets 352 150 - 400 K/uL   nRBC 0.0 0.0 - 0.2 %   Neutrophils Relative % 47 %   Neutro Abs 4.0 1.7 - 7.7 K/uL   Lymphocytes Relative 42 %   Lymphs Abs 3.6 0.7 - 4.0 K/uL   Monocytes Relative 9 %   Monocytes Absolute 0.8 0.1 - 1.0 K/uL   Eosinophils Relative 1 %   Eosinophils Absolute 0.1 0.0 - 0.5 K/uL   Basophils Relative 1 %   Basophils Absolute 0.0 0.0 - 0.1 K/uL   Immature Granulocytes 0 %   Abs Immature Granulocytes 0.03 0.00 - 0.07 K/uL    Comment: Performed at [facility], [address] [place], [place] [address]  Pregnancy serum, qualitative     Status: None   Collection Time: 11/06/22  7:28 AM  Result Value Ref Range   Preg, Serum NEGATIVE NEGATIVE    Comment:        THE SENSITIVITY OF THIS METHODOLOGY IS >10 mIU/mL. Performed at [facility], [address] [place], [place] [address]    CT Angio Head Neck W WO CM  Result Date: 11/06/2022 CLINICAL DATA:  Left facial numbness and weakness beginning yesterday afternoon. EXAM: CT ANGIOGRAPHY HEAD AND NECK TECHNIQUE: Multidetector CT imaging of the head and neck was performed using the standard protocol during bolus administration of intravenous contrast. Multiplanar CT image reconstructions and MIPs were obtained to evaluate the vascular anatomy. Carotid stenosis measurements (when applicable) are obtained utilizing NASCET criteria, using the distal internal carotid diameter as the denominator. RADIATION DOSE REDUCTION: This  exam was performed according to the departmental dose-optimization program which includes automated exposure control, adjustment of the mA and/or kV according to patient size and/or use of iterative reconstruction technique. CONTRAST:  76m OMNIPAQUE IOHEXOL 350 MG/ML SOLN COMPARISON:  Brain MRI 08/17/2020 FINDINGS: CT HEAD FINDINGS Brain: There is no acute intracranial hemorrhage,  extra-axial fluid collection, or acute infarct. Parenchymal volume is normal. The ventricles are normal in size. Gray-white differentiation is preserved. The pituitary and suprasellar region are normal. There is no mass lesion. There is no mass effect or midline shift. Vascular: See below. Skull: Normal. Negative for fracture or focal lesion. Sinuses/Orbits: The paranasal sinuses are clear. The mastoid air cells are clear. The globes and orbits are unremarkable. Other: None. Review of the MIP images confirms the above findings CTA NECK FINDINGS Evaluation of the arterial vasculature is essentially nondiagnostic due to bolus timing. There is mild calcified plaque of the carotid bifurcations. The degree of stenosis can not be assessed. Patency of the vertebral arteries can not be assessed. Skeleton: There is no acute osseous abnormality or suspicious osseous lesion. There is no visible canal hematoma. 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 Other neck: The soft tissues of the neck are unremarkable. Upper chest: The imaged lung apices are clear. Review of the MIP images confirms the above findings CTA HEAD FINDINGS Evaluation of the intracranial arterial vasculature is markedly limited by bolus timing. Anterior circulation: Intracranial ICAs appear grossly patent with calcified plaque. The bilateral M1 segments appear patent. The distal branches cannot be assessed. The bilateral A1 segments are patent. The distal branches cannot be assessed. Aneurysm can  not be excluded. Posterior circulation: The posterior circulation cannot be assessed. Venous sinuses: Patent. Review of the MIP images confirms the above findings IMPRESSION: 1. No acute intracranial pathology on initial noncontrast head CT. 2. Nondiagnostic CTA head/neck due to bolus timing and body habitus. Recommend repeat study or MRA if there is persistent clinical concern. Electronically Signed   By: Valetta Mole M.D.   On: 11/06/2022 08:42   DG Chest 2 View  Result Date: 11/06/2022 CLINICAL DATA:  Chest pressure and shortness of breath on exertion EXAM: CHEST - 2 VIEW COMPARISON:  Chest radiograph dated 01/22/2021 FINDINGS: Normal lung volumes. No focal consolidations. No pleural effusion or pneumothorax. Similar cardiomediastinal silhouette. The visualized skeletal structures are unremarkable. IMPRESSION: No active cardiopulmonary disease. Electronically Signed   By: Darrin Nipper M.D.   On: 11/06/2022 08:34    Pending Labs Unresulted Labs (From admission, onward)     Start     Ordered   11/07/22 0500  Lipid panel  (Labs)  Tomorrow morning,   R       Comments: Fasting    11/06/22 1831   11/07/22 0500  CBC  Tomorrow morning,   R        11/06/22 2301   11/07/22 [id number]  Basic metabolic panel  Tomorrow morning,   R        11/06/22 2301   11/06/22 2301  TSH  Once,   R        11/06/22 2301   11/06/22 2258  HIV Antibody (routine testing w rflx)  (HIV Antibody (Routine testing w reflex) panel)  Once,   R        11/06/22 2301   11/06/22 2216  Iron and TIBC  Once,   R        11/06/22 2215   11/06/22 2216  Ferritin  Once,   R        11/06/22 2215   11/06/22 1831  Hemoglobin A1c  (Labs)  Once,   URGENT       Comments: To assess prior glycemic control    11/06/22 1831  11/06/22 0720  CBC with Differential  Once,   STAT        11/06/22 0720            Vitals/Pain Today's Vitals   11/06/22 1550 11/06/22 1657 11/06/22 1658 11/06/22 2112  BP:  (!) 151/79  (!) 170/61  Pulse: 72 72  73   Resp:  18  18  Temp: 97.7 F (36.5 C) 98.5 F (36.9 C)  98.1 F (36.7 C)  TempSrc:  Oral  Oral  SpO2: 100% 96%  100%  Weight:      Height:      PainSc:   0-No pain     Isolation Precautions No active isolations  Medications Medications   stroke: early stages of recovery book (has no administration in time range)  atorvastatin (LIPITOR) tablet 40 mg (has no administration in time range)  prochlorperazine (COMPAZINE) injection 10 mg (has no administration in time range)  0.9 %  sodium chloride infusion (has no administration in time range)  acetaminophen (TYLENOL) tablet 650 mg (has no administration in time range)    Or  acetaminophen (TYLENOL) suppository 650 mg (has no administration in time range)  iohexol (OMNIPAQUE) 350 MG/ML injection 100 mL (75 mLs Intravenous Contrast Given 11/06/22 0805)  acetaminophen (TYLENOL) tablet 650 mg (650 mg Oral Given 11/06/22 1605)  ketorolac (TORADOL) 15 MG/ML injection 15 mg (15 mg Intravenous Given 11/06/22 2050)    And  diphenhydrAMINE (BENADRYL) injection 25 mg (25 mg Intravenous Given 11/06/22 2050)    Mobility walks     Focused Assessments Neuro Assessment Handoff:  Swallow screen pass? Yes    NIH Stroke Scale  Dizziness Present: No Headache Present: No Interval: Initial Level of Consciousness (1a.)   : Alert, keenly responsive LOC Questions (1b. )   : Answers both questions correctly LOC Commands (1c. )   : Performs both tasks correctly Best Gaze (2. )  : Normal Visual (3. )  : No visual loss Facial Palsy (4. )    : Normal symmetrical movements Motor Arm, Left (5a. )   : No drift Motor Arm, Right (5b. ) : No drift Motor Leg, Left (6a. )  : No drift Motor Leg, Right (6b. ) : No drift Limb Ataxia (7. ): Absent Sensory (8. )  : Mild-to-moderate sensory loss, patient feels pinprick is less sharp or is dull on the affected side, or there is a loss of superficial pain with pinprick, but patient is aware of being touched Best  Language (9. )  : No aphasia Dysarthria (10. ): Normal Extinction/Inattention (11.)   : No Abnormality Complete NIHSS TOTAL: 1     Neuro Assessment: Exceptions to WDL Neuro Checks:   Initial (11/06/22 0810)  Has TPA been given? No If patient is a Neuro Trauma and patient is going to OR before floor call report to [place] nurse: [phone] or [phone]   R Recommendations: See Admitting Provider Note  Report given to:   Additional Notes:

## 2022-11-07 NOTE — Progress Notes (Addendum)
STROKE TEAM PROGRESS NOTE   INTERVAL HISTORY No family is at the bedside. Patient is sitting up on the side of the bed eating breakfast.  She states she developed left side weakness and numbness. Symptoms have improved some but still present.  Unable to obtain MRI due to body habitus so will recheck CT/CTA head and neck today. CTA was non diagnostic  Vitals:   11/07/22 0110 11/07/22 0114 11/07/22 0411 11/07/22 0822  BP:  (!) 125/105 (!) 142/73 (!) 157/76  Pulse:  75 75 70  Resp:  '16 18 16  '$ Temp:  99.1 F (37.3 C) 98.4 F (36.9 C) 98.5 F (36.9 C)  TempSrc:  Oral Oral Oral  SpO2:  98% 98% 98%  Weight: (!) 175.4 kg     Height: '5\' 3"'$  (1.6 m)      CBC:  Recent Labs  Lab 11/06/22 0728 11/07/22 0430  WBC 8.5 6.0  NEUTROABS 4.0  --   HGB 10.6* 10.1*  HCT 33.7* 33.0*  MCV 77.1* 77.8*  PLT 352 [id number]   Basic Metabolic Panel:  Recent Labs  Lab 11/06/22 0727 11/07/22 0430  NA 135 136  K 3.5 3.2*  CL 105 100  CO2 24 25  GLUCOSE 116* 87  BUN 15 11  CREATININE 0.98 0.83  CALCIUM 8.3* 8.4*   Lipid Panel:  Recent Labs  Lab 11/07/22 0430  CHOL 263*  TRIG 80  HDL 73  CHOLHDL 3.6  VLDL 16  LDLCALC 174*   HgbA1c:  Recent Labs  Lab 11/07/22 0430  HGBA1C 5.9*   Urine Drug Screen: No results for input(s): "LABOPIA", "COCAINSCRNUR", "LABBENZ", "AMPHETMU", "THCU", "LABBARB" in the last 168 hours.  Alcohol Level No results for input(s): "ETH" in the last 168 hours.  IMAGING past 24 hours No results found.  PHYSICAL EXAM  Temp:  [97.7 F (36.5 C)-99.1 F (37.3 C)] 98.5 F (36.9 C) (02/24 0822) Pulse Rate:  [70-84] 70 (02/24 0822) Resp:  [16-24] 16 (02/24 0822) BP: (125-171)/(61-105) 157/76 (02/24 0822) SpO2:  [95 %-100 %] 98 % (02/24 0822) Weight:  [175.4 kg] 175.4 kg (02/24 0110)  General - Well nourished, well developed, in no apparent distress. Cardiovascular - Regular rhythm and rate.  Mental Status -  Level of arousal and orientation to time, place, and  person were intact. Language including expression, naming, repetition, comprehension was assessed and found intact. Attention span and concentration were normal. Recent and remote memory were intact. Fund of Knowledge was assessed and was intact.  Cranial Nerves II - XII - II - Visual field intact OU. III, IV, VI - Extraocular movements intact. V - Facial sensation intact bilaterally. VII - slight left facial droop VIII - Hearing & vestibular intact bilaterally. X - Palate elevates symmetrically. XI - Chin turning & shoulder shrug intact bilaterally. XII - Tongue protrusion intact.  Motor Strength - Right UE 5/5 left UE 5-/5, right lower 5/5, left lower 4/5 Motor Tone - Muscle tone was assessed at the neck and appendages and was normal.  Sensory - decreased on left face   Coordination - The patient had normal movements in the hands and feet with no ataxia or dysmetria.  Tremor was absent.  Gait and Station - deferred. Current ASSESSMENT/PLAN Terri Boyd is a 52 y.o. female with history of Asthma, obesity, migraine, HTN, HLD, DVT no longer on [place], and TIAs presenting with chest pressure, left sided weakness, and facial numbness in the left and tongue that initially started 2/22 in the  afternoon.   Stroke: Stroke like symptoms  Etiology:  unclear   CT head No acute abnormality.  CTA head & neck non diagnostic  Repeat CT/CTA head and neck today  MRI   unable to obtain due to body habitus  2D Echo ordered  LDL 174 HgbA1c 5.9 VTE prophylaxis - SCD's    Diet   Diet Heart Room service appropriate? Yes; Fluid consistency: Thin   aspirin 81 mg daily prior to admission, now on aspirin 81 mg daily.  Therapy recommendations:  pending Disposition:  pending  Hypertension Home meds:  none ( she does not take any meds, prescriptions ran out and never refilled) Stable Permissive hypertension (OK if < 220/120) but gradually normalize in 5-7 days Long-term BP goal  normotensive  Hyperlipidemia Home meds:  none LDL 174, goal < 70 Add atorvastatin '40mg'$   Continue statin at discharge  Other Stroke Risk Factors Obesity, Body mass index is 68.5 kg/m., BMI >/= 30 associated with increased stroke risk, recommend weight loss, diet and exercise as appropriate  Hx stroke/TIA Migraines Possible Obstructive sleep apnea, will need outpatient study   Hospital day # 0  Beulah Gandy DNP, ACNPC-AG  [facility]  ATTENDING ATTESTATION:  52 year old with history of DVTs and TIAs with left-sided weakness numbness concern for possible stroke.  However unable get MRI due to weight restrictions.  On exam she appears to have some mild left upper extremity weakness of 5- out of 5 and left leg weakness at 4+ out of 5.  Sensory exam is normal.  Cranial nerves are normal.  Will pursue repeat head CT-negative on my review, official results still pending treat with DAPT therapy with aspirin Plavix for 3 weeks and aspirin alone.  Complete stroke workup.  PT OT recommended outpatient therapy.  Obesity is a risk factor for stroke and would recommend weight loss surgery to be pursued outpatient.  Can follow-up in stroke clinic in 8 weeks after discharge with [place] neurological Dr. Leonie Man.  ADDENDUM: echo 60-65%.   Dr. Reeves Forth evaluated pt independently, reviewed imaging, chart, labs. Discussed and formulated plan with the Resident/APP. Changes were made to the note where appropriate. Please see APP/resident note above for details.   Terri Alas,MD

## 2022-11-07 NOTE — Progress Notes (Signed)
Echo attempted; patient eating breakfast. Wilkie Aye RVT RCS

## 2022-11-07 NOTE — Progress Notes (Addendum)
This pt is discharged and I was told in a report she's supposed to get US of the LE before she leaves. I called to follow up with  the [place] department and they said she's not on their list. I asked Dr. Nevada Crane if patient is staying or going home tonight. Awaiting for reply.

## 2022-11-07 NOTE — Plan of Care (Signed)
  Problem: Education: Goal: Knowledge of disease or condition will improve Outcome: Progressing Goal: Knowledge of secondary prevention will improve (MUST DOCUMENT ALL) Outcome: Progressing   Problem: Ischemic Stroke/TIA Tissue Perfusion: Goal: Complications of ischemic stroke/TIA will be minimized Outcome: Progressing   Problem: Coping: Goal: Will verbalize positive feelings about self Outcome: Progressing Goal: Will identify appropriate support needs Outcome: Progressing   Problem: Health Behavior/Discharge Planning: Goal: Ability to manage health-related needs will improve Outcome: Progressing   Problem: Self-Care: Goal: Ability to participate in self-care as condition permits will improve Outcome: Progressing   Problem: Nutrition: Goal: Risk of aspiration will decrease Outcome: Progressing   Problem: Clinical Measurements: Goal: Ability to maintain clinical measurements within normal limits will improve Outcome: Progressing Goal: Will remain free from infection Outcome: Progressing Goal: Cardiovascular complication will be avoided Outcome: Progressing

## 2022-11-07 NOTE — TOC Transition Note (Addendum)
Transition of Care [facility]) - CM/SW Discharge Note   Patient Details  Name: Terri Boyd MRN: [id number] Date of Birth: 1971/05/30  [facility] CM/SW Contact:  Carles Collet, RN Phone Number: 11/07/2022, 2:46 PM   Clinical Narrative:     REF [id number] for OP PT and OT made to [facility]. Added to AVS. No DME needs Upon completion of referral, epic alerted that Dr Starla Link made PT referral [phone] to [facility], I cancelled my referral         Patient Goals and CMS Choice      Discharge Placement                         Discharge Plan and Services Additional resources added to the After Visit Summary for                                       Social Determinants of Health (SDOH) Interventions SDOH Screenings   Food Insecurity: No Food Insecurity (11/07/2022)  Housing: Low Risk  (11/07/2022)  Transportation Needs: No Transportation Needs (11/07/2022)  Utilities: Not At Risk (11/07/2022)  Tobacco Use: Medium Risk (11/07/2022)     Readmission Risk Interventions     No data to display

## 2022-11-07 NOTE — Progress Notes (Signed)
  Echocardiogram 2D Echocardiogram has been performed.  Terri Boyd 11/07/2022, 3:07 PM

## 2022-11-07 NOTE — Plan of Care (Signed)
  Problem: Education: Goal: Knowledge of disease or condition will improve Outcome: Progressing   Problem: Self-Care: Goal: Ability to participate in self-care as condition permits will improve Outcome: Progressing Goal: Ability to communicate needs accurately will improve Outcome: Progressing   Problem: Nutrition: Goal: Risk of aspiration will decrease Outcome: Progressing Goal: Dietary intake will improve Outcome: Progressing   Problem: Education: Goal: Knowledge of General Education information will improve Description: Including pain rating scale, medication(s)/side effects and non-pharmacologic comfort measures Outcome: Progressing   Problem: Coping: Goal: Level of anxiety will decrease Outcome: Progressing

## 2022-11-08 ENCOUNTER — Observation Stay (HOSPITAL_BASED_OUTPATIENT_CLINIC_OR_DEPARTMENT_OTHER): Payer: Medicaid Other

## 2022-11-08 DIAGNOSIS — M79606 Pain in leg, unspecified: Secondary | ICD-10-CM | POA: Diagnosis not present

## 2022-11-08 NOTE — Plan of Care (Signed)
  Problem: Education: Goal: Knowledge of disease or condition will improve Outcome: Progressing   Problem: Ischemic Stroke/TIA Tissue Perfusion: Goal: Complications of ischemic stroke/TIA will be minimized Outcome: Progressing   Problem: Coping: Goal: Will verbalize positive feelings about self Outcome: Progressing Goal: Will identify appropriate support needs Outcome: Progressing   Problem: Health Behavior/Discharge Planning: Goal: Ability to manage health-related needs will improve Outcome: Progressing   Problem: Self-Care: Goal: Ability to communicate needs accurately will improve Outcome: Progressing   Problem: Nutrition: Goal: Risk of aspiration will decrease Outcome: Progressing Goal: Dietary intake will improve Outcome: Progressing   Problem: Health Behavior/Discharge Planning: Goal: Ability to manage health-related needs will improve Outcome: Progressing

## 2022-11-08 NOTE — Plan of Care (Signed)
  Problem: Education: Goal: Knowledge of disease or condition will improve Outcome: Progressing Goal: Knowledge of secondary prevention will improve (MUST DOCUMENT ALL) Outcome: Progressing   Problem: Ischemic Stroke/TIA Tissue Perfusion: Goal: Complications of ischemic stroke/TIA will be minimized Outcome: Progressing   Problem: Coping: Goal: Will verbalize positive feelings about self Outcome: Progressing Goal: Will identify appropriate support needs Outcome: Progressing   Problem: Health Behavior/Discharge Planning: Goal: Goals will be collaboratively established with patient/family Outcome: Progressing   Problem: Self-Care: Goal: Ability to participate in self-care as condition permits will improve Outcome: Progressing Goal: Verbalization of feelings and concerns over difficulty with self-care will improve Outcome: Progressing Goal: Ability to communicate needs accurately will improve Outcome: Progressing   Problem: Education: Goal: Knowledge of General Education information will improve Description: Including pain rating scale, medication(s)/side effects and non-pharmacologic comfort measures Outcome: Progressing   Problem: Clinical Measurements: Goal: Ability to maintain clinical measurements within normal limits will improve Outcome: Progressing Goal: Will remain free from infection Outcome: Progressing Goal: Cardiovascular complication will be avoided Outcome: Progressing   Problem: Activity: Goal: Risk for activity intolerance will decrease Outcome: Progressing   Problem: Elimination: Goal: Will not experience complications related to bowel motility Outcome: Progressing

## 2022-11-08 NOTE — Progress Notes (Signed)
Patient was supposed to be discharged on 11/07/2022 but lower extremity vascular duplex ultrasound was not done.  Duplex ultrasound is still pending but once that gets done, patient can be discharged home. Patient seen at bedside. Patient to the full discharge summary done on me on 11/07/2022 for full details.

## 2022-11-13 ENCOUNTER — Encounter (HOSPITAL_BASED_OUTPATIENT_CLINIC_OR_DEPARTMENT_OTHER): Payer: Self-pay | Admitting: Emergency Medicine

## 2022-11-13 ENCOUNTER — Ambulatory Visit: Payer: Medicaid Other | Admitting: Physical Therapy

## 2022-11-13 ENCOUNTER — Emergency Department (HOSPITAL_BASED_OUTPATIENT_CLINIC_OR_DEPARTMENT_OTHER)
Admission: EM | Admit: 2022-11-13 | Discharge: 2022-11-13 | Disposition: A | Discharge status: Home / Self Care | Payer: Medicaid Other | Attending: Emergency Medicine | Admitting: Emergency Medicine

## 2022-11-13 ENCOUNTER — Emergency Department (HOSPITAL_BASED_OUTPATIENT_CLINIC_OR_DEPARTMENT_OTHER): Payer: Medicaid Other

## 2022-11-13 ENCOUNTER — Encounter: Payer: Self-pay | Admitting: Physical Therapy

## 2022-11-13 ENCOUNTER — Other Ambulatory Visit: Payer: Self-pay

## 2022-11-13 VITALS — BP 163/91 | HR 109

## 2022-11-13 DIAGNOSIS — Z7982 Long term (current) use of aspirin: Secondary | ICD-10-CM | POA: Insufficient documentation

## 2022-11-13 DIAGNOSIS — R030 Elevated blood-pressure reading, without diagnosis of hypertension: Secondary | ICD-10-CM | POA: Diagnosis present

## 2022-11-13 DIAGNOSIS — I10 Essential (primary) hypertension: Secondary | ICD-10-CM

## 2022-11-13 DIAGNOSIS — R531 Weakness: Secondary | ICD-10-CM | POA: Insufficient documentation

## 2022-11-13 DIAGNOSIS — Z8673 Personal history of transient ischemic attack (TIA), and cerebral infarction without residual deficits: Secondary | ICD-10-CM | POA: Diagnosis not present

## 2022-11-13 DIAGNOSIS — Z79899 Other long term (current) drug therapy: Secondary | ICD-10-CM | POA: Insufficient documentation

## 2022-11-13 DIAGNOSIS — R269 Unspecified abnormalities of gait and mobility: Secondary | ICD-10-CM

## 2022-11-13 DIAGNOSIS — Z7902 Long term (current) use of antithrombotics/antiplatelets: Secondary | ICD-10-CM | POA: Diagnosis not present

## 2022-11-13 LAB — CBC WITH DIFFERENTIAL/PLATELET
Abs Immature Granulocytes: 0.17 10*3/uL — ABNORMAL HIGH (ref 0.00–0.07)
Basophils Absolute: 0 10*3/uL (ref 0.0–0.1)
Basophils Relative: 0 %
Eosinophils Absolute: 0 10*3/uL (ref 0.0–0.5)
Eosinophils Relative: 0 %
HCT: 35.8 % — ABNORMAL LOW (ref 36.0–46.0)
Hemoglobin: 11.2 g/dL — ABNORMAL LOW (ref 12.0–15.0)
Immature Granulocytes: 2 %
Lymphocytes Relative: 18 %
Lymphs Abs: 1.5 10*3/uL (ref 0.7–4.0)
MCH: 24 pg — ABNORMAL LOW (ref 26.0–34.0)
MCHC: 31.3 g/dL (ref 30.0–36.0)
MCV: 76.7 fL — ABNORMAL LOW (ref 80.0–100.0)
Monocytes Absolute: 0.4 10*3/uL (ref 0.1–1.0)
Monocytes Relative: 5 %
Neutro Abs: 6 10*3/uL (ref 1.7–7.7)
Neutrophils Relative %: 75 %
Platelets: 369 10*3/uL (ref 150–400)
RBC: 4.67 MIL/uL (ref 3.87–5.11)
RDW: 16.9 % — ABNORMAL HIGH (ref 11.5–15.5)
WBC: 8 10*3/uL (ref 4.0–10.5)
nRBC: 0 % (ref 0.0–0.2)

## 2022-11-13 LAB — COMPREHENSIVE METABOLIC PANEL
ALT: 23 U/L (ref 0–44)
AST: 20 U/L (ref 15–41)
Albumin: 3.6 g/dL (ref 3.5–5.0)
Alkaline Phosphatase: 105 U/L (ref 38–126)
Anion gap: 7 (ref 5–15)
BUN: 18 mg/dL (ref 6–20)
CO2: 24 mmol/L (ref 22–32)
Calcium: 8.7 mg/dL — ABNORMAL LOW (ref 8.9–10.3)
Chloride: 106 mmol/L (ref 98–111)
Creatinine, Ser: 0.94 mg/dL (ref 0.44–1.00)
GFR, Estimated: >60 mL/min (ref >60)
Glucose, Bld: 152 mg/dL — ABNORMAL HIGH (ref 70–99)
Potassium: 3.9 mmol/L (ref 3.5–5.1)
Sodium: 137 mmol/L (ref 135–145)
Total Bilirubin: 0.4 mg/dL (ref 0.3–1.2)
Total Protein: 7.6 g/dL (ref 6.5–8.1)

## 2022-11-13 LAB — TROPONIN I (HIGH SENSITIVITY)
Troponin I (High Sensitivity): 4 ng/L (ref <18)
Troponin I (High Sensitivity): 3 ng/L (ref <18)

## 2022-11-13 MED ORDER — LABETALOL HCL 5 MG/ML IV SOLN
10.0000 mg | Freq: Once | INTRAVENOUS | Status: AC
  Administered 2022-11-13: 10 mg via INTRAVENOUS
  Filled 2022-11-13: qty 4

## 2022-11-13 MED ORDER — AMLODIPINE BESYLATE 5 MG PO TABS
5.0000 mg | ORAL_TABLET | Freq: Once | ORAL | Status: AC
  Administered 2022-11-13: 5 mg via ORAL
  Filled 2022-11-13: qty 1

## 2022-11-13 MED ORDER — AMLODIPINE BESYLATE 5 MG PO TABS: 5.0000 mg | ORAL_TABLET | Freq: Every day | ORAL | 1 refills | Status: DC

## 2022-11-13 NOTE — ED Triage Notes (Signed)
Was at PT today , high BP per staff , sent here for evaluation , had TIA , discharged on 1 week ago . Pt expresses no symptoms . Alert and orinetd x 4 , ambulatory .

## 2022-11-13 NOTE — Therapy (Signed)
OUTPATIENT PHYSICAL THERAPY NEURO EVALUATION   Patient Name: Terri Boyd MRN: [id number] DOB:08-Jan-1971, 52 y.o., female Today's Date: 11/13/2022   PCP: Hermina Barters REFERRING PROVIDER: Aline August, MD  END OF SESSION:  PT End of Session - 11/13/22 1151     Visit Number 0    Authorization Type [place] Medicaid    PT Start Time 1150    PT Stop Time 1215    PT Time Calculation (min) 25 min    Equipment Utilized During Treatment Gait belt    Activity Tolerance Patient tolerated treatment well    Behavior During Therapy WFL for tasks assessed/performed             Past Medical History:  Diagnosis Date   Asthma    DVT (deep venous thrombosis) (HCC)    High cholesterol    Hypertension    Migraine    Obesity    Stroke ([place])    TIA (transient ischemic attack)    Past Surgical History:  Procedure Laterality Date   LEFT HEART CATH AND CORONARY ANGIOGRAPHY N/A 01/12/2017   Procedure: Left Heart Cath and Coronary Angiography;  Surgeon: Belva Crome, MD;  Location: [facility];  Service: Cardiovascular;  Laterality: N/A;   TUBAL LIGATION     Patient Active Problem List   Diagnosis Date Noted   Stroke-like symptoms 11/06/2022   Microcytic anemia 11/06/2022   Morbid obesity ([place])    Essential hypertension    Pure hypercholesterolemia    Chest pain 01/11/2017   Right shoulder pain 02/17/2013   Migraine     ONSET DATE: 11/07/2022  REFERRING DIAG: R20.2 (ICD-10-CM) - Paresthesia  THERAPY DIAG:  Abnormality of gait and mobility  Rationale for Evaluation and Treatment: Rehabilitation  SUBJECTIVE:                                                                                                                                                                                             SUBJECTIVE STATEMENT: Patient went into the ED on 11/06/2022 with sudden onset of weakness on L side. After extensive testing determined most likely to be a TIA. They were unable to  perform MRI due to body habitus but CT clear per chart review. Patient reports a previous TIA about 10 years ago but then later reports multiple episodes of intermittent L side weakness. Patient reports that she noticed the weakness again today on her L side; reports that she had not experienced since being released from the hospital until today. 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  Pt accompanied by: self  PERTINENT HISTORY:  medical history significant of asthma, hx of DVT, HTN, HLD, hx of TIA presented with left-sided weakness/facial numbness along with chest pressure  OBJECTIVE:  Vitals:   11/13/22 1205 11/13/22 1207  BP: (!) 150/124 (!) 163/91  Pulse: (!) 128 (!) 109   Initial reading taken on upper, second reading on lower arm  PATIENT EDUCATION: Education details: Recommended going to ED, see below Person educated: Patient Education method: Explanation Education comprehension: verbalized understanding  ASSESSMENT:  CLINICAL IMPRESSION: Patient is a 52 y.o. female who was seen today for physical therapy evaluation and treatment for recent TIA. Patient arrived to session and reports new onset L side weakness that had previously fully resolved since most recent TIA. BP assessed as noted above and given upper arm reading, strongly recommended going to ED for evaluation. Advised patient to not drive herself given symptoms of mild L side weakness and advised calling family member to drive her or allowing [place] to call 911. Patient refused despite repeated encouraging and stated "I will drive home and then have someone take me. This weakness just happens sometimes." Recommend full medical  evaluation and new referral if appropriate to PT. Session is arrive no charge.  Esperanza Heir, PT, DPT 11/13/2022, 12:30 PM

## 2022-11-13 NOTE — Discharge Instructions (Addendum)
Please follow-up with your primary care doctor I have started you on a blood pressure medication please take this daily, and take your blood pressure twice a day and make a log for your primary care doctor.  Additionally you should follow-up outpatient for EMG.  You can follow-up with your neurologist for this.  If you do not have a neurologist I have provided you with information to see when outpatient.

## 2022-11-13 NOTE — ED Provider Notes (Signed)
[place] EM[facility] AT [facility] Provider Note   CSN: [id number] Arrival date & time: 11/13/22  1304     History  Chief Complaint  Patient presents with   Hypertension    Terri Boyd is a 52 y.o. female, history of TIA, who presents to the ED secondary to elevated blood pressure, and intermittent left leg tingling for the last day.  States that she went to PT today and was told that she had an elevated blood pressure, and she told them that she had tingling of her left leg, was so was sent to the ER.  Denies any current neurodeficits, weakness, difficulty with speech, headache, chest pain, shortness of breath.  Is not on any blood pressure medications.     Home Medications Prior to Admission medications   Medication Sig Start Date End Date Taking? Authorizing Provider  amLODipine (NORVASC) 5 MG tablet Take 1 tablet (5 mg total) by mouth daily. 11/13/22  Yes Sevon Rotert L, PA  aspirin 81 MG chewable tablet Chew 1 tablet (81 mg total) by mouth daily. 11/08/22   Aline August, MD  aspirin-acetaminophen-caffeine (EXCEDRIN MIGRAINE) [phone] MG per tablet Take 2 tablets by mouth as needed for headache or migraine.    [provider]  atorvastatin (LIPITOR) 40 MG tablet Take 1 tablet (40 mg total) by mouth daily at 6 PM. 11/07/22   Aline August, MD  clopidogrel (PLAVIX) 75 MG tablet Take 1 tablet (75 mg total) by mouth daily. 11/08/22   Aline August, MD  furosemide (LASIX) 20 MG tablet Take 20 mg by mouth as needed for fluid or edema.    [provider]  nitroGLYCERIN (NITROSTAT) 0.4 MG SL tablet Place 1 tablet (0.4 mg total) under the tongue every 5 (five) minutes as needed for chest pain (CP or SOB). 11/07/22   Aline August, MD      Allergies    Patient has no known allergies.    Review of Systems   Review of Systems  Respiratory:  Negative for shortness of breath.   Cardiovascular:  Negative for chest pain.  Neurological:  Negative  for headaches.    Physical Exam Updated Vital Signs BP (!) 170/87   Pulse 70   Temp (!) 97.5 F (36.4 C) (Oral)   Resp 18   LMP 10/20/2022 (Approximate)   SpO2 98%  Physical Exam Vitals and nursing note reviewed.  Constitutional:      General: She is not in acute distress.    Appearance: She is well-developed.  HENT:     Head: Normocephalic and atraumatic.  Eyes:     Conjunctiva/sclera: Conjunctivae normal.  Cardiovascular:     Rate and Rhythm: Normal rate and regular rhythm.     Heart sounds: No murmur heard. Pulmonary:     Effort: Pulmonary effort is normal. No respiratory distress.     Breath sounds: Normal breath sounds.  Abdominal:     Palpations: Abdomen is soft.     Tenderness: There is no abdominal tenderness.  Musculoskeletal:        General: No swelling.     Cervical back: Neck supple.  Skin:    General: Skin is warm and dry.     Capillary Refill: Capillary refill takes less than 2 seconds.  Neurological:     General: No focal deficit present.     Mental Status: She is alert and oriented to person, place, and time.     Cranial Nerves: No cranial nerve deficit.  Sensory: No sensory deficit.     Motor: No weakness.     Gait: Gait normal.  Psychiatric:        Mood and Affect: Mood normal.     ED Results / Procedures / Treatments   Labs (all labs ordered are listed, but only abnormal results are displayed) Labs Reviewed  COMPREHENSIVE METABOLIC PANEL - Abnormal; Notable for the following components:      Result Value   Glucose, Bld 152 (*)    Calcium 8.7 (*)    All other components within normal limits  CBC WITH DIFFERENTIAL/PLATELET - Abnormal; Notable for the following components:   Hemoglobin 11.2 (*)    HCT 35.8 (*)    MCV 76.7 (*)    MCH 24.0 (*)    RDW 16.9 (*)    Abs Immature Granulocytes 0.17 (*)    All other components within normal limits  TROPONIN I (HIGH SENSITIVITY)  TROPONIN I (HIGH SENSITIVITY)    EKG None  Radiology CT  Head Wo Contrast  Result Date: 11/13/2022 CLINICAL DATA:  Hypertension physical therapy. Recent TIA. Patient has no complaints. EXAM: CT HEAD WITHOUT CONTRAST TECHNIQUE: Contiguous axial images were obtained from the base of the skull through the vertex without intravenous contrast. RADIATION DOSE REDUCTION: This exam was performed according to the departmental dose-optimization program which includes automated exposure control, adjustment of the mA and/or kV according to patient size and/or use of iterative reconstruction technique. COMPARISON:  CT head dated November 07, 2022. FINDINGS: Brain: No evidence of acute infarction, hemorrhage, hydrocephalus, extra-axial collection or mass lesion/mass effect. Vascular: Atherosclerotic vascular calcification of the carotid siphons. No hyperdense vessel. Skull: Normal. Negative for fracture or focal lesion. Sinuses/Orbits: No acute finding. Other: None. IMPRESSION: 1. No acute intracranial abnormality. Electronically Signed   By: Titus Dubin M.D.   On: 11/13/2022 14:15    Procedures Procedures   Medications Ordered in ED Medications  amLODipine (NORVASC) tablet 5 mg (has no administration in time range)  labetalol (NORMODYNE) injection 10 mg (10 mg Intravenous Given 11/13/22 1413)    ED Course/ Medical Decision Making/ A&P                             Medical Decision Making Patient is a 52 year old female, here sent by her physical therapist secondary to left leg weakness, and hypertension.  Hypertensive PT system here.  She denies any weakness at this time, states that since she was admitted she has had intermittent left lower extremity weakness, and denies any recent falls.  Left lower extremity weakness and paresthesias were her presenting symptom for last TIA so she came in per patient.  She has no neurodeficits, is well-appearing.  Will obtain a head CT, troponins, EKG for further evaluation.  Amount and/or Complexity of Data Reviewed Labs:  ordered.    Details: Troponin within normal limits Radiology: ordered.    Details: Head CT unremarkable Discussion of management or test interpretation with external provider(s): Discussed with patient, she is hypertensive, not on any blood pressure medications we will start her on amlodipine 5 mg, and have her follow-up with neurology.  I sent message to Dr. Leonel Ramsay, neurology, and he recommends follow-up with neurology outpatient, for outpatient EMG.  We discussed possible transfer for another facility given that she is unable to have MRI, and he states given that she is already on therapeutics aspirin Plavix, and there would be little benefit to the patient to do  that.  I discussed this with the patient, and we discharged home, with amlodipine, and follow-up with neurology she has her own neurologist but she does not know if they are still in practice, so I provided her with information for a new neurologist.  Return precautions emphasized.  Risk Prescription drug management.   Final Clinical Impression(s) / ED Diagnoses Final diagnoses:  Left-sided weakness  Hypertension, unspecified type    Rx / DC Orders ED Discharge Orders          Ordered    amLODipine (NORVASC) 5 MG tablet  Daily        11/13/22 1632              Osvaldo Shipper, [place] 11/13/22 1643    Tegeler, Gwenyth Allegra, MD 11/16/22 1524

## 2022-11-19 ENCOUNTER — Ambulatory Visit: Payer: Medicaid Other | Admitting: Physical Therapy

## 2022-11-25 ENCOUNTER — Encounter: Payer: Medicaid Other | Admitting: Rehabilitative and Restorative Service Providers"

## 2022-11-25 ENCOUNTER — Ambulatory Visit: Payer: Medicaid Other

## 2022-12-01 ENCOUNTER — Encounter: Payer: Medicaid Other | Admitting: Occupational Therapy

## 2022-12-01 ENCOUNTER — Ambulatory Visit: Payer: Medicaid Other | Admitting: Physical Therapy

## 2022-12-04 ENCOUNTER — Ambulatory Visit: Payer: Medicaid Other | Admitting: Physical Therapy

## 2022-12-09 ENCOUNTER — Ambulatory Visit: Payer: Medicaid Other

## 2022-12-09 ENCOUNTER — Encounter: Payer: Medicaid Other | Admitting: Rehabilitative and Restorative Service Providers"

## 2023-02-14 ENCOUNTER — Encounter (HOSPITAL_BASED_OUTPATIENT_CLINIC_OR_DEPARTMENT_OTHER): Payer: Self-pay | Admitting: Emergency Medicine

## 2023-02-14 ENCOUNTER — Emergency Department (HOSPITAL_BASED_OUTPATIENT_CLINIC_OR_DEPARTMENT_OTHER)
Admission: EM | Admit: 2023-02-14 | Discharge: 2023-02-14 | Disposition: A | Discharge status: Home / Self Care | Payer: 59 | Attending: Emergency Medicine | Admitting: Emergency Medicine

## 2023-02-14 ENCOUNTER — Other Ambulatory Visit: Payer: Self-pay

## 2023-02-14 DIAGNOSIS — Z7902 Long term (current) use of antithrombotics/antiplatelets: Secondary | ICD-10-CM | POA: Insufficient documentation

## 2023-02-14 DIAGNOSIS — M2550 Pain in unspecified joint: Secondary | ICD-10-CM | POA: Diagnosis present

## 2023-02-14 DIAGNOSIS — Z7982 Long term (current) use of aspirin: Secondary | ICD-10-CM | POA: Insufficient documentation

## 2023-02-14 DIAGNOSIS — Z79899 Other long term (current) drug therapy: Secondary | ICD-10-CM | POA: Insufficient documentation

## 2023-02-14 DIAGNOSIS — M791 Myalgia, unspecified site: Secondary | ICD-10-CM | POA: Diagnosis not present

## 2023-02-14 MED ORDER — METHYLPREDNISOLONE SODIUM SUCC 125 MG IJ SOLR: 125.0000 mg | Freq: Once | INTRAMUSCULAR | Status: DC

## 2023-02-14 MED ORDER — METHYLPREDNISOLONE SODIUM SUCC 125 MG IJ SOLR
125.0000 mg | Freq: Once | INTRAMUSCULAR | Status: AC
  Administered 2023-02-14: 125 mg via INTRAMUSCULAR
  Filled 2023-02-14: qty 2

## 2023-02-14 MED ORDER — OXYCODONE HCL 5 MG PO TABS: 5.0000 mg | ORAL_TABLET | ORAL | 0 refills | Status: DC | PRN

## 2023-02-14 NOTE — Discharge Instructions (Addendum)
Please follow-up with your primary care provider.  Your exam today is reassuring.  Have sent in some pain medication for comfort.  Continue taking 1000 mg of Tylenol every 8 hours.  You received a shot of steroids in the emergency department.  Avoid foods that may significantly increase your blood sugar.

## 2023-02-14 NOTE — ED Triage Notes (Signed)
Pt had shingles vaccine on Thursday.  Pt also had dental work on Friday.  Pt states she started to have generalized joint pain on Friday afternoon.  Pt states the joint pain is worsening.  No known fevers at home.  No new medications.

## 2023-02-14 NOTE — ED Provider Notes (Signed)
[place] [facility] Provider Note   CSN: [id number] Arrival date & time: 02/14/23  1325     History  Chief Complaint  Patient presents with   Joint Pain    Terri Boyd is a 52 y.o. female.  52 year old female presents today for concern of generalized bodyaches and joint pain that has been present since Friday.  She states on Wednesday she had a shingles vaccine.  Friday she had dental procedure done.  Friday is when she noticed the symptoms.  She denies any fever, difficulty ambulating.  She has taken Tylenol without significant improvement.  She states due to all the other medications she is on she is unable to take NSAIDs.  Denies any other complaints at this time.  The history is provided by the patient. No language interpreter was used.       Home Medications Prior to Admission medications   Medication Sig Start Date End Date Taking? Authorizing Provider  amLODipine (NORVASC) 5 MG tablet Take 1 tablet (5 mg total) by mouth daily. 11/13/22   Small, Brooke L, PA  aspirin 81 MG chewable tablet Chew 1 tablet (81 mg total) by mouth daily. 11/08/22   Glade Lloyd, MD  aspirin-acetaminophen-caffeine (EXCEDRIN MIGRAINE) [phone] MG per tablet Take 2 tablets by mouth as needed for headache or migraine.    [provider]  atorvastatin (LIPITOR) 40 MG tablet Take 1 tablet (40 mg total) by mouth daily at 6 PM. 11/07/22   Glade Lloyd, MD  clopidogrel (PLAVIX) 75 MG tablet Take 1 tablet (75 mg total) by mouth daily. 11/08/22   Glade Lloyd, MD  furosemide (LASIX) 20 MG tablet Take 20 mg by mouth as needed for fluid or edema.    [provider]  nitroGLYCERIN (NITROSTAT) 0.4 MG SL tablet Place 1 tablet (0.4 mg total) under the tongue every 5 (five) minutes as needed for chest pain (CP or SOB). 11/07/22   Glade Lloyd, MD      Allergies    Patient has no known allergies.    Review of Systems   Review of Systems   Constitutional:  Negative for fever.  Musculoskeletal:  Positive for joint swelling and myalgias. Negative for neck pain and neck stiffness.  Neurological:  Negative for light-headedness.  All other systems reviewed and are negative.   Physical Exam Updated Vital Signs BP (!) 159/96 (BP Location: Left Wrist)   Pulse 96   Temp 98.5 F (36.9 C) (Oral)   Resp 18   Ht 5\' 4"  (1.626 m)   Wt (!) 183.7 kg   LMP 02/04/2023   SpO2 94%   BMI 69.52 kg/m  Physical Exam Vitals and nursing note reviewed.  Constitutional:      General: She is not in acute distress.    Appearance: Normal appearance. She is not ill-appearing.  HENT:     Head: Normocephalic and atraumatic.     Nose: Nose normal.  Eyes:     Conjunctiva/sclera: Conjunctivae normal.  Cardiovascular:     Rate and Rhythm: Normal rate.  Pulmonary:     Effort: Pulmonary effort is normal. No respiratory distress.  Musculoskeletal:        General: No deformity. Normal range of motion.     Comments: All major joints of bilateral upper and lower extremities with full range of motion and without tenderness to palpation.  5/5 strength in extensor and flexor muscle groups.  Patient ambulates without difficulty.  Skin:    Findings:  No rash.  Neurological:     Mental Status: She is alert.     ED Results / Procedures / Treatments   Labs (all labs ordered are listed, but only abnormal results are displayed) Labs Reviewed - No data to display  EKG None  Radiology No results found.  Procedures Procedures    Medications Ordered in ED Medications - No data to display  ED Course/ Medical Decision Making/ A&P                             Medical Decision Making Risk Prescription drug management.   52 year old female presents for evaluation of generalized joint pain, and muscle aches.  Ongoing since Friday.  Denies any URI symptoms.  No sick contacts.  Good range of motion and good strength on exam.  She ambulates without  difficulty.  If she is unable to take NSAIDs and Tylenol has not provided significant relief we will give her a short course of pain medication.  Will give her a dose of Solu-Medrol in the emergency department.  Discussed increasing Tylenol dose to 1000 mg every 8 hours.  Patient is in agreement with plan.  She will call her PCP tomorrow to follow-up appointment.  Reports she is a diabetic.  Is on oral medication.  Her recent CMP shows a glucose of 99.  Discussed after the shot of steroid that she should avoid foods that will significantly raise her blood sugar.  She is in agreement.  This could be an autoimmune disorder given the timing patient events.  She is in agreement to follow-up with PCP for additional workup.  Without any urgent or emergent concerns today.  She is ambulatory.  Final Clinical Impression(s) / ED Diagnoses Final diagnoses:  Myalgia    Rx / DC Orders ED Discharge Orders          Ordered    oxyCODONE (ROXICODONE) 5 MG immediate release tablet  Every 4 hours PRN        02/14/23 1453              Marita [place], PA-C 02/14/23 1508    Ernie Avena, MD 02/15/23 1511

## 2023-02-14 NOTE — ED Notes (Signed)
D/c paperwork reviewed with pt, including prescriptions and follow up care.  All questions and/or concerns addressed at time of d/c.  No further needs expressed. . Pt verbalized understanding, Ambulatory without assistance to ED exit, NAD.   

## 2023-03-17 ENCOUNTER — Other Ambulatory Visit: Payer: Self-pay

## 2023-03-17 ENCOUNTER — Encounter (HOSPITAL_BASED_OUTPATIENT_CLINIC_OR_DEPARTMENT_OTHER): Payer: Self-pay | Admitting: Emergency Medicine

## 2023-03-17 ENCOUNTER — Emergency Department (HOSPITAL_BASED_OUTPATIENT_CLINIC_OR_DEPARTMENT_OTHER): Payer: 59

## 2023-03-17 ENCOUNTER — Emergency Department (HOSPITAL_BASED_OUTPATIENT_CLINIC_OR_DEPARTMENT_OTHER)
Admission: EM | Admit: 2023-03-17 | Discharge: 2023-03-17 | Disposition: A | Discharge status: Home / Self Care | Payer: 59 | Attending: Emergency Medicine | Admitting: Emergency Medicine

## 2023-03-17 DIAGNOSIS — M79604 Pain in right leg: Secondary | ICD-10-CM | POA: Diagnosis not present

## 2023-03-17 DIAGNOSIS — Z7902 Long term (current) use of antithrombotics/antiplatelets: Secondary | ICD-10-CM | POA: Insufficient documentation

## 2023-03-17 DIAGNOSIS — M7989 Other specified soft tissue disorders: Secondary | ICD-10-CM | POA: Insufficient documentation

## 2023-03-17 DIAGNOSIS — R0789 Other chest pain: Secondary | ICD-10-CM | POA: Diagnosis present

## 2023-03-17 DIAGNOSIS — R079 Chest pain, unspecified: Secondary | ICD-10-CM

## 2023-03-17 DIAGNOSIS — Z7982 Long term (current) use of aspirin: Secondary | ICD-10-CM | POA: Insufficient documentation

## 2023-03-17 LAB — CBC
HCT: 36.1 % (ref 36.0–46.0)
Hemoglobin: 11.2 g/dL — ABNORMAL LOW (ref 12.0–15.0)
MCH: 24.6 pg — ABNORMAL LOW (ref 26.0–34.0)
MCHC: 31 g/dL (ref 30.0–36.0)
MCV: 79.3 fL — ABNORMAL LOW (ref 80.0–100.0)
Platelets: 341 10*3/uL (ref 150–400)
RBC: 4.55 MIL/uL (ref 3.87–5.11)
RDW: 16.8 % — ABNORMAL HIGH (ref 11.5–15.5)
WBC: 6.5 10*3/uL (ref 4.0–10.5)
nRBC: 0 % (ref 0.0–0.2)

## 2023-03-17 LAB — BASIC METABOLIC PANEL
Anion gap: 10 (ref 5–15)
BUN: 16 mg/dL (ref 6–20)
CO2: 25 mmol/L (ref 22–32)
Calcium: 9.2 mg/dL (ref 8.9–10.3)
Chloride: 103 mmol/L (ref 98–111)
Creatinine, Ser: 0.94 mg/dL (ref 0.44–1.00)
GFR, Estimated: >60 mL/min (ref >60)
Glucose, Bld: 102 mg/dL — ABNORMAL HIGH (ref 70–99)
Potassium: 3.9 mmol/L (ref 3.5–5.1)
Sodium: 138 mmol/L (ref 135–145)

## 2023-03-17 LAB — TROPONIN I (HIGH SENSITIVITY)
Troponin I (High Sensitivity): 4 ng/L (ref <18)
Troponin I (High Sensitivity): 4 ng/L (ref <18)

## 2023-03-17 MED ORDER — APIXABAN 2.5 MG PO TABS
10.0000 mg | ORAL_TABLET | Freq: Two times a day (BID) | ORAL | Status: DC
  Administered 2023-03-17: 10 mg via ORAL
  Filled 2023-03-17: qty 4

## 2023-03-17 MED ORDER — APIXABAN (ELIQUIS) VTE STARTER PACK (10MG AND 5MG)
ORAL_TABLET | ORAL | 0 refills | Status: DC
  Filled 2023-03-19: qty 74, 30d supply, fill #0

## 2023-03-17 MED ORDER — IOHEXOL 350 MG/ML SOLN
100.0000 mL | Freq: Once | INTRAVENOUS | Status: AC | PRN
  Administered 2023-03-17: 100 mL via INTRAVENOUS

## 2023-03-17 NOTE — ED Notes (Signed)
Pt. Has gone to CT for PE Study.

## 2023-03-17 NOTE — ED Triage Notes (Signed)
Patient from home w/ complaints of pain to the R leg for a month now, also complains of chest tightness that started yesterday evening, this is occurring intermittently, states it takes her breath away. Patient went to PCP yesterday and had a d-dimer and was told it was elevated. States they were trying to have a DVT study done but it wasn't approved by insurance. Has a hx of blood clots. Not currently anticoagulated. Endorses some nausea, denies emesis.

## 2023-03-17 NOTE — ED Notes (Signed)
Discharge instructions provided by edp were discussed with pt. Pt verbalized understanding with no additional questions at this time. Pt to go home with son.

## 2023-03-17 NOTE — Discharge Instructions (Addendum)
Please follow-up with the hematologist for your appointment on Monday regarding the possible blood clot in your right leg.  There is a region of your right femoral vein that may be having a clot.  It is not clear from the ultrasound.  Given your history I think it was reasonable to start you on a blood thinner called Eliquis.  But I would recommend that one of your doctors recheck an ultrasound in 1 week.  Please STOP taking aspirin, and any other NSAID medications like ibuprofen, or Aleve, which can act as blood thinners on top of your Eliquis.  I also recommended referral to cardiology for your chest pain.  Your workup was reassuring in the ER today but it does not rule out all serious potential heart disease.

## 2023-03-17 NOTE — ED Provider Notes (Signed)
[place] [facility] Provider Note   CSN: [id number] Arrival date & time: 03/17/23  1945     History {Add pertinent medical, surgical, social history, OB history to HPI:1} Chief Complaint  Patient presents with   Chest Pain   Leg Pain    Terri Boyd is a 52 y.o. female with history of high cholesterol, TIA stroke, obesity, DVT, not on AC, presented to the ED with complaint of leg pain and swelling and chest pain.  Patient reports she has been having intermittent leg swelling and pain for several weeks, the right there is more persistently painful than the left.  She reports she had a DVT in one of her legs nearly a decade ago, cannot recall which, but was temporarily on anticoagulation and then discontinued.  She reports concern that she has also had chest pressure that is pleuritic ongoing since yesterday.  It was waxing and waning but never going away completely.  She reports some shortness of breath with it.  Denies nausea, vomiting, radiation into her arm.  Denies history of MI or known coronary disease. HPI     Home Medications Prior to Admission medications   Medication Sig Start Date End Date Taking? Authorizing Provider  amLODipine (NORVASC) 5 MG tablet Take 1 tablet (5 mg total) by mouth daily. 11/13/22   Small, Brooke L, PA  aspirin 81 MG chewable tablet Chew 1 tablet (81 mg total) by mouth daily. 11/08/22   Glade Lloyd, MD  aspirin-acetaminophen-caffeine (EXCEDRIN MIGRAINE) [phone] MG per tablet Take 2 tablets by mouth as needed for headache or migraine.    [provider]  atorvastatin (LIPITOR) 40 MG tablet Take 1 tablet (40 mg total) by mouth daily at 6 PM. 11/07/22   Glade Lloyd, MD  clopidogrel (PLAVIX) 75 MG tablet Take 1 tablet (75 mg total) by mouth daily. 11/08/22   Glade Lloyd, MD  furosemide (LASIX) 20 MG tablet Take 20 mg by mouth as needed for fluid or edema.    [provider]  nitroGLYCERIN  (NITROSTAT) 0.4 MG SL tablet Place 1 tablet (0.4 mg total) under the tongue every 5 (five) minutes as needed for chest pain (CP or SOB). 11/07/22   Glade Lloyd, MD  oxyCODONE (ROXICODONE) 5 MG immediate release tablet Take 1 tablet (5 mg total) by mouth every 4 (four) hours as needed for severe pain. 02/14/23   Marita Kansas, PA-C      Allergies    Patient has no known allergies.    Review of Systems   Review of Systems  Physical Exam Updated Vital Signs BP (!) 166/103 (BP Location: Left Arm)   Pulse 93   Temp 98.3 F (36.8 C) (Oral)   Resp (!) 22   Ht 5\' 4"  (1.626 m)   Wt (!) 183 kg   SpO2 96%   BMI 69.25 kg/m  Physical Exam Constitutional:      General: She is not in acute distress.    Appearance: She is obese.  HENT:     Head: Normocephalic and atraumatic.  Eyes:     Conjunctiva/sclera: Conjunctivae normal.     Pupils: Pupils are equal, round, and reactive to light.  Cardiovascular:     Rate and Rhythm: Normal rate and regular rhythm.  Pulmonary:     Effort: Pulmonary effort is normal. No respiratory distress.  Abdominal:     General: There is no distension.     Tenderness: There is no abdominal tenderness.  Musculoskeletal:  Comments: Like exam is limited by obese habitus, no significant visible unilateral swelling  Skin:    General: Skin is warm and dry.  Neurological:     General: No focal deficit present.     Mental Status: She is alert. Mental status is at baseline.  Psychiatric:        Mood and Affect: Mood normal.        Behavior: Behavior normal.     ED Results / Procedures / Treatments   Labs (all labs ordered are listed, but only abnormal results are displayed) Labs Reviewed  BASIC METABOLIC PANEL  CBC  PREGNANCY, URINE  TROPONIN I (HIGH SENSITIVITY)    EKG None  Radiology No results found.  Procedures Procedures  {Document cardiac monitor, telemetry assessment procedure when appropriate:1}  Medications Ordered in ED Medications -  No data to display  ED Course/ Medical Decision Making/ A&P   {   Click here for ABCD2, HEART and other calculatorsREFRESH Note before signing :1}                          Medical Decision Making Amount and/or Complexity of Data Reviewed Labs: ordered. Radiology: ordered.   This patient presents to the ED with concern for leg pain and swelling, chest pain. This involves an extensive number of treatment options, and is a complaint that carries with it a high risk of complications and morbidity.  The differential diagnosis includes fevers PE versus pneumonia versus pleural effusion versus musculoskeletal pain versus ACS versus other  Co-morbidities that complicate the patient evaluation: History of prior clotting, as well as cardiovascular risk factors, at high risk of cardiac complications and thrombus.  External records from outside source obtained and reviewed including echo 11/07/22 with EF 60-65%  I ordered and personally interpreted labs.  The pertinent results include:  ***  I ordered imaging studies including x-ray of the chest ordered through intake, CT PE study, DVT ultrasound of the lower extremities I independently visualized and interpreted imaging which showed *** I agree with the radiologist interpretation  The patient was maintained on a cardiac monitor.  I personally viewed and interpreted the cardiac monitored which showed an underlying rhythm of: ***  Per my interpretation the patient's ECG shows sinus rhythm no acute ischemic findings  I ordered medication including ***  for ***  I have reviewed the patients home medicines and have made adjustments as needed  Test Considered: ***  I requested consultation with the ***,  and discussed lab and imaging findings as well as pertinent plan - they recommend: ***  After the interventions noted above, I reevaluated the patient and found that they have: {resolved/improved/worsened:23923::"improved"}  Social Determinants  of Health:***  Dispostion:  After consideration of the diagnostic results and the patients response to treatment, I feel that the patent would benefit from ***.   {Document critical care time when appropriate:1} {Document review of labs and clinical decision tools ie heart score, Chads2Vasc2 etc:1}  {Document your independent review of radiology images, and any outside records:1} {Document your discussion with family members, caretakers, and with consultants:1} {Document social determinants of health affecting pt's care:1} {Document your decision making why or why not admission, treatments were needed:1} Final Clinical Impression(s) / ED Diagnoses Final diagnoses:  None    Rx / DC Orders ED Discharge Orders     None

## 2023-03-17 NOTE — ED Notes (Signed)
Pt. Reports shortness of breath with R leg pain below the R knee.  Pt. Has edema noted in both lower legs and in both feet bilat.

## 2023-03-19 ENCOUNTER — Other Ambulatory Visit (HOSPITAL_BASED_OUTPATIENT_CLINIC_OR_DEPARTMENT_OTHER): Payer: Self-pay

## 2023-03-20 ENCOUNTER — Other Ambulatory Visit (HOSPITAL_BASED_OUTPATIENT_CLINIC_OR_DEPARTMENT_OTHER): Payer: Self-pay

## 2023-03-20 ENCOUNTER — Other Ambulatory Visit (HOSPITAL_COMMUNITY): Payer: Self-pay

## 2023-05-14 HISTORY — PX: HYSTEROSCOPY WITH D & C: SHX1775

## 2023-05-21 ENCOUNTER — Encounter: Payer: Self-pay | Admitting: Cardiovascular Disease

## 2023-05-21 ENCOUNTER — Ambulatory Visit: Payer: Medicaid Other | Attending: Cardiovascular Disease | Admitting: Cardiovascular Disease

## 2023-05-21 VITALS — BP 137/86 | HR 72 | Ht 64.0 in | Wt 394.2 lb

## 2023-05-21 DIAGNOSIS — I10 Essential (primary) hypertension: Secondary | ICD-10-CM

## 2023-05-21 DIAGNOSIS — I82411 Acute embolism and thrombosis of right femoral vein: Secondary | ICD-10-CM

## 2023-05-21 DIAGNOSIS — E7800 Pure hypercholesterolemia, unspecified: Secondary | ICD-10-CM

## 2023-05-21 DIAGNOSIS — R072 Precordial pain: Secondary | ICD-10-CM | POA: Diagnosis not present

## 2023-05-21 DIAGNOSIS — E119 Type 2 diabetes mellitus without complications: Secondary | ICD-10-CM | POA: Diagnosis not present

## 2023-05-21 DIAGNOSIS — Z8673 Personal history of transient ischemic attack (TIA), and cerebral infarction without residual deficits: Secondary | ICD-10-CM

## 2023-05-21 NOTE — Progress Notes (Unsigned)
Cardiology Office Note:  .   Date:  05/22/2023  ID:  Terri Boyd, DOB April 02, 1971, MRN [id number] PCP: Mathis Fare  [facility] Providers Cardiologist:  None    History of Present Illness: Marland Kitchen   Terri Boyd is a 52 y.o. premenopausal female with super morbid obesity, remote history of transient ischemic attack and history of DVT referred in consultation by Katheren Puller, PA-C for chest pain.  Over the last many months she has had a sensation of a pressure in the retrosternal area and immediately to the left of the sternum.  This has been present "all the time" for months, although it is sometimes worse in intensity than others.  There is no association between the discomfort and physical activity.  She does not engage in regular exercise, but the chest pain does not get any worse when she climbs a step to the her front door, when she goes grocery shopping, etc.  The chest discomfort has clear positional qualities.  She is tender to touch bilaterally over the costochondral joints.  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  Her mother passed away in Jan 13, 2023.  Before she passed away she often had to lift her bodily out of the bed.  She thinks she may have strained her chest muscles doing this.  She also complains of constant pain in both her legs which limits her ability to walk and has been diagnosed as representing arthritis.  She stopped taking her atorvastatin for several months to see if this led to improvement in her musculoskeletal complaints.  There was no change.  Without therapy her LDL cholesterol is quite high at 174, but her HDL is excellent at 73.  She was seen in the emergency room 03/17/2023 with right leg swelling and discomfort.  Her D-dimer was markedly elevated and there was questionable evidence of a nonocclusive thrombus in the right mid femoral vein.  She had a chest CT angiogram that showed no evidence of pulmonary  embolism but did describe "coronary artery atherosclerotic calcifications".  She is currently taking Eliquis and is due to see a hematologist.  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  She has had problems of heavy menses on anticoagulation in the past, but has recently had an endometrial ablation and had improvement.   Studies Reviewed: Marland Kitchen   EKG Interpretation Date/Time:  Friday May 21 2023 10:03:45 EDT Ventricular Rate:  72 PR Interval:  138 QRS Duration:  90 QT Interval:  388 QTC Calculation: 424 R Axis:   39  Text Interpretation: Normal sinus rhythm Normal ECG When compared with ECG of 17-Mar-2023 20:03, No significant change since last tracing Confirmed by Videl Nobrega [phone]) on 05/21/2023 10:18:36 AM   Chest CT angiogram 03/17/2023 IMPRESSION: 1. No pulmonary embolism. 2. Lungs are clear. 3. Coronary artery atherosclerotic calcifications.   Lower extremity venous Doppler 03/17/2023 IMPRESSION: 1. Questionable nonocclusive thrombus in the right mid femoral vein. 2. No evidence of deep venous thrombosis in the left lower extremity. 3. Posterior tibial and peroneal veins are not well visualized due to edema    Cardiac catheterization 2018 The coronaries are widely patent. Anatomy is codominant. Luminal regularities are noted in the proximal and mid LAD. First diagonal contains 40% narrowing. Luminal irregularities noted in the mid right. Circumflex contains no significant obstruction. Left ventricular pressures are normal. End-diastolic pressure is 11 mmHg. Anterior and inferobasal hyperkinesis is noted. Ejection  fraction cannot be estimated due to poor opacification.   Echocardiogram 2018  - Left ventricle: The cavity size was normal. Systolic function was    normal. The estimated ejection fraction was in the range of 60%    to 65%. Wall motion was normal; there were no regional wall    motion abnormalities. Left ventricular diastolic  function    parameters were normal.  - Atrial septum: No defect or patent foramen ovale was identified.   Risk Assessment/Calculations:             Physical Exam:   VS:  BP 137/86 (BP Location: Left Arm, Patient Position: Sitting) Comment (Cuff Size): Thigh Cuff  Pulse 72   Ht 5\' 4"  (1.626 m)   Wt (!) 394 lb 3.2 oz (178.8 kg)   SpO2 96%   BMI 67.66 kg/m    Wt Readings from Last 3 Encounters:  05/21/23 (!) 394 lb 3.2 oz (178.8 kg)  03/17/23 (!) 403 lb 7.1 oz (183 kg)  02/14/23 (!) 405 lb (183.7 kg)    GEN: Well nourished, well developed in no acute distress.  Super morbid obesity. NECK: No JVD; No carotid bruits CARDIAC: RRR, no murmurs, rubs, gallops RESPIRATORY:  Clear to auscultation without rales, wheezing or rhonchi  ABDOMEN: Soft, non-tender, non-distended EXTREMITIES:  No edema; No deformity   ASSESSMENT AND PLAN: .   Chest pain: This sounds distinctly musculoskeletal in its pattern, most likely represents costochondritis.  It does not sound consistent with coronary insufficiency.  Cardiac catheterization performed a few years ago showed minor coronary plaque.  Unfortunately, due to her body habitus there is no good noninvasive method to exclude CAD.  If she has symptoms that are more suspicious for coronary problem would recommend going directly to coronary angiography.  At this point, although she has very severe obesity, her other risk factors have been generally well-controlled.  She has very well-controlled diabetes mellitus and HTN, does not smoke, is premenopausal.  Hopefully will have successful weight loss on Ozempic.  She has been off her statin to see if it helps with her musculoskeletal complaints, without improvement. DVT: Currently on anticoagulation.  Unclear whether she had a new DVT.  She reports that she has an upcoming appointment with a hematologist which can help prescribe duration of therapy.  Definitely at increased risk for venous thromboembolic  complications due to her weight. DM: Excellent control with a hemoglobin A1c of 5.9% in February (and per her report now down to 5.7%) on therapy with metformin. HTN: On losartan, which is a good choice for renal protection. HLP: She has multiple musculoskeletal complaints, none of which improved after an extended "statin holiday".  Recommend restarting the atorvastatin. History of TIA: Remote, no residual problems.  She has been taking clopidogrel until she was started on Eliquis. Super super obesity       Dispo: Restart atorvastatin 40 mg daily. Recheck lipids after 2-3 months. Follow up as needed.  Signed, Thurmon Fair, MD  .patinst

## 2023-05-21 NOTE — Patient Instructions (Signed)
Medication Instructions:  No changes *If you need a refill on your cardiac medications before your next appointment, please call your pharmacy*   Lab Work: Lipid profile- Please return for Blood Work in 2 months for Fasting lab work. No appointment needed, lab here at the office is open Monday-Friday from 8AM to 4PM and closed daily for lunch from 12:45-1:45.   If you have labs (blood work) drawn today and your tests are completely normal, you will receive your results only by: MyChart Message (if you have MyChart) OR A paper copy in the mail If you have any lab test that is abnormal or we need to change your treatment, we will call you to review the results.  Follow-Up: At [facility], you and your health needs are our priority.  As part of our continuing mission to provide you with exceptional heart care, we have created designated Provider Care Teams.  These Care Teams include your primary Cardiologist (physician) and Advanced Practice Providers (APPs -  Physician Assistants and Nurse Practitioners) who all work together to provide you with the care you need, when you need it.  We recommend signing up for the patient portal called "MyChart".  Sign up information is provided on this After Visit Summary.  MyChart is used to connect with patients for Virtual Visits (Telemedicine).  Patients are able to view lab/test results, encounter notes, upcoming appointments, etc.  Non-urgent messages can be sent to your provider as well.   To learn more about what you can do with MyChart, go to [email or web address].    Your next appointment:    Follow up as needed  Provider:   Dr Royann Shivers

## 2023-05-22 DIAGNOSIS — I82411 Acute embolism and thrombosis of right femoral vein: Secondary | ICD-10-CM | POA: Insufficient documentation

## 2023-05-22 DIAGNOSIS — Z8673 Personal history of transient ischemic attack (TIA), and cerebral infarction without residual deficits: Secondary | ICD-10-CM | POA: Insufficient documentation

## 2023-05-22 DIAGNOSIS — E119 Type 2 diabetes mellitus without complications: Secondary | ICD-10-CM | POA: Insufficient documentation

## 2023-05-22 MED ORDER — ATORVASTATIN CALCIUM 40 MG PO TABS: 40.0000 mg | ORAL_TABLET | Freq: Every day | ORAL | 1 refills | Status: DC

## 2023-05-28 ENCOUNTER — Ambulatory Visit (INDEPENDENT_AMBULATORY_CARE_PROVIDER_SITE_OTHER): Payer: Medicaid Other | Admitting: Diagnostic Neuroimaging

## 2023-05-28 ENCOUNTER — Encounter: Payer: Self-pay | Admitting: Diagnostic Neuroimaging

## 2023-05-28 VITALS — BP 166/92 | HR 80 | Ht 64.0 in | Wt 388.2 lb

## 2023-05-28 DIAGNOSIS — R200 Anesthesia of skin: Secondary | ICD-10-CM

## 2023-05-28 DIAGNOSIS — R29898 Other symptoms and signs involving the musculoskeletal system: Secondary | ICD-10-CM

## 2023-05-28 DIAGNOSIS — R202 Paresthesia of skin: Secondary | ICD-10-CM

## 2023-05-28 DIAGNOSIS — M79605 Pain in left leg: Secondary | ICD-10-CM

## 2023-05-28 DIAGNOSIS — M79604 Pain in right leg: Secondary | ICD-10-CM | POA: Diagnosis not present

## 2023-05-28 DIAGNOSIS — R278 Other lack of coordination: Secondary | ICD-10-CM

## 2023-05-28 NOTE — Patient Instructions (Signed)
  LEG PAIN (below knees; could be related to musculoskeletal strain, from BMI, arthritis, leg swelling; could be mild diabetic neuropathy, but symptoms not typical) - continue medical mgmt of diabetes and weight mgmt - consider MRI lumbar spine (pain with standing and walking; could rule out spinal stenosis; but patient denies any back pain or radicular pain) - consider duloxetine or pregabalin - consider PT evaluation / water therapy

## 2023-05-28 NOTE — Progress Notes (Signed)
[facility]  PATIENT: Terri Boyd DOB: 1971-05-05  REFERRING CLINICIAN: Trenda Moots, PA-C HISTORY FROM: patient  REASON FOR VISIT: new consult   HISTORICAL  CHIEF COMPLAINT:  Chief Complaint  Patient presents with   New Patient (Initial Visit)    Rm 7, paper referral Bilateral LE weakness and pain for several months now.      HISTORY OF PRESENT ILLNESS:   52 year old female here for evaluation of lower extremity pain.  History of TIA 10 years ago and 1 year ago.  Has had some recurrent left-sided weakness with normal imaging.  Since May 2024 has had onset of swelling and pain in the legs, right worse than left side.  Symptoms mainly from her feet up to her knees.  She has a lot of pain in her calves.  She has had lab testing with PCP which was unremarkable.  Was tried coming off of her statin to see if this would help  but no change.  Continues to have left face numbness, left arm clumsiness and left leg weakness.   REVIEW OF SYSTEMS: Full 14 system review of systems performed and negative with exception of: As per HPI.  ALLERGIES: No Known Allergies  HOME MEDICATIONS: Outpatient Medications Prior to Visit  Medication Sig Dispense Refill   apixaban (ELIQUIS) 5 MG TABS tablet Take 5 mg by mouth 2 (two) times daily.     atorvastatin (LIPITOR) 40 MG tablet Take 1 tablet (40 mg total) by mouth daily at 6 PM. 30 tablet 1   D3-50 1.25 MG (50000 UT) capsule Take 50,000 Units by mouth once a week.     ferrous sulfate 325 (65 FE) MG EC tablet Take 325 mg by mouth daily with breakfast.     furosemide (LASIX) 20 MG tablet Take 20 mg by mouth as needed for fluid or edema.     losartan (COZAAR) 50 MG tablet Take 50 mg by mouth daily.     meloxicam (MOBIC) 15 MG tablet Take 15 mg by mouth daily.     metFORMIN (GLUCOPHAGE) 500 MG tablet Take 500 mg by mouth daily.     Multiple Vitamin (MULTI-VITAMIN) tablet Take 1 tablet by mouth daily.     nitroGLYCERIN  (NITROSTAT) 0.4 MG SL tablet Place 1 tablet (0.4 mg total) under the tongue every 5 (five) minutes as needed for chest pain (CP or SOB). 30 tablet 0   rosuvastatin (CRESTOR) 20 MG tablet Take 20 mg by mouth daily.     tirzepatide [facility]) 2.5 MG/0.5ML Pen Inject 2.5 mg into the skin once a week.     OZEMPIC, 0.25 OR 0.5 MG/DOSE, 2 MG/3ML SOPN Inject 2 mg into the skin once a week.     amLODipine (NORVASC) 5 MG tablet Take 1 tablet (5 mg total) by mouth daily. (Patient not taking: Reported on 05/28/2023) 30 tablet 1   No facility-administered medications prior to visit.    PAST MEDICAL HISTORY: Past Medical History:  Diagnosis Date   Asthma    DVT (deep venous thrombosis) (HCC)    High cholesterol    Hypertension    Migraine    Obesity    Stroke (HCC)    TIA (transient ischemic attack)     PAST SURGICAL HISTORY: Past Surgical History:  Procedure Laterality Date   LEFT HEART CATH AND CORONARY ANGIOGRAPHY N/A 01/12/2017   Procedure: Left Heart Cath and Coronary Angiography;  Surgeon: Lyn Records, MD;  Location: [facility] INVASIVE CV LAB;  Service:  Cardiovascular;  Laterality: N/A;   TUBAL LIGATION      FAMILY HISTORY: Family History  Problem Relation Age of Onset   Dementia Mother    CAD Father 44       MI/stents    Breast cancer Maternal Aunt     SOCIAL HISTORY: Social History   Socioeconomic History   Marital status: Single    Spouse name: Not on file   Number of children: Not on file   Years of education: Not on file   Highest education level: Not on file  Occupational History   Not on file  Tobacco Use   Smoking status: Former    Types: Cigarettes   Smokeless tobacco: Never  Vaping Use   Vaping status: Never Used  Substance and Sexual Activity   Alcohol use: Yes    Comment: occ   Drug use: No   Sexual activity: Not Currently    Birth control/protection: Surgical  Other Topics Concern   Not on file  Social History Narrative   Caffiene 1 cup daily occ, tea  occ   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   Social Determinants of Health   Financial Resource Strain: Low Risk  (04/22/2023)   Received from [facility]   Overall Financial Resource Strain (CARDIA)    Difficulty of Paying Living Expenses: Not hard at all  Food Insecurity: No Food Insecurity (04/22/2023)   Received from [facility]   Hunger Vital Sign    Worried About Running Out of Food in the Last Year: Never true    Ran Out of Food in the Last Year: Never true  Transportation Needs: No Transportation Needs (04/22/2023)   Received from [facility] - Transportation    Lack of Transportation (Medical): No    Lack of Transportation (Non-Medical): No  Physical Activity: Sufficiently Active (04/22/2023)   Received from [facility]   Exercise Vital Sign    Days of Exercise per Week: 3 days    Minutes of Exercise per Session: 60 min  Stress: No Stress Concern Present (04/22/2023)   Received from [facility] - Occupational Stress Questionnaire    Feeling of Stress : Not at all  Social Connections: Socially Integrated (04/22/2023)   Received from [facility]   Social Network    How would you rate your social network (family, work, friends)?: Good participation with social networks  Intimate Partner Violence: Not At Risk (04/22/2023)   Received from [facility]   HITS    Over the last 12 months how often did your partner physically hurt you?: 1    Over the last 12 months how often did your partner insult you or talk down to you?: 1    Over the last 12 months how often did your partner threaten you with physical harm?: 1    Over the last 12 months how often did your partner scream or curse at you?: 1     PHYSICAL EXAM  GENERAL EXAM/CONSTITUTIONAL: Vitals:  Vitals:   05/28/23 0853 05/28/23 0903  BP: (!) 161/92 (!) 166/92  Pulse: 78 80  Weight: (!) 388 lb 3.2 oz (176.1 kg)   Height: 5\' 4"  (1.626 m)    Body mass index is 66.63 kg/m. Wt  Readings from Last 3 Encounters:  05/28/23 (!) 388 lb 3.2 oz (176.1 kg)  05/21/23 (!) 394 lb 3.2 oz (178.8 kg)  03/17/23 (!) 403 lb 7.1 oz (183 kg)   Patient is  in no distress; well developed, nourished and groomed; neck is supple  CARDIOVASCULAR: Examination of carotid arteries is normal; no carotid bruits Regular rate and rhythm, no murmurs Examination of peripheral vascular system by observation and palpation is normal  EYES: Ophthalmoscopic exam of optic discs and posterior segments is normal; no papilledema or hemorrhages No results found.  MUSCULOSKELETAL: Gait, strength, tone, movements noted in Neurologic exam below  NEUROLOGIC: MENTAL STATUS:      No data to display         awake, alert, oriented to person, place and time recent and remote memory intact normal attention and concentration language fluent, comprehension intact, naming intact fund of knowledge appropriate  CRANIAL NERVE:  2nd - no papilledema on fundoscopic exam 2nd, 3rd, 4th, 6th - pupils equal and reactive to light, visual fields full to confrontation, extraocular muscles intact, no nystagmus; LEFT PTOSIS 5th - facial sensation symmetric 7th - facial strength symmetric 8th - hearing intact 9th - palate elevates symmetrically, uvula midline 11th - shoulder shrug symmetric 12th - tongue protrusion midline  MOTOR:  normal bulk and tone, full strength in the BUE, RLE LLE --> HIP FLEXION 3-4, KNEE EXT 4; OTHERWISE 5  SENSORY:  normal and symmetric to light touch, temperature, vibration  COORDINATION:  finger-nose-finger, fine finger movements --> NORMAL ON RIGHT; SLOW WITH DYSMETRIA ON LEFT  REFLEXES:  deep tendon reflexes TRACE and symmetric  GAIT/STATION:  narrow based gait     DIAGNOSTIC DATA (LABS, IMAGING, TESTING) - I reviewed patient records, labs, notes, testing and imaging myself where available.  Lab Results  Component Value Date   WBC 6.5 03/17/2023   HGB 11.2 (L)  03/17/2023   HCT 36.1 03/17/2023   MCV 79.3 (L) 03/17/2023   PLT 341 03/17/2023      Component Value Date/Time   NA 138 03/17/2023 2024   K 3.9 03/17/2023 2024   CL 103 03/17/2023 2024   CO2 25 03/17/2023 2024   GLUCOSE 102 (H) 03/17/2023 2024   BUN 16 03/17/2023 2024   CREATININE 0.94 03/17/2023 2024   CALCIUM 9.2 03/17/2023 2024   PROT 7.6 11/13/2022 1412   ALBUMIN 3.6 11/13/2022 1412   AST 20 11/13/2022 1412   ALT 23 11/13/2022 1412   ALKPHOS 105 11/13/2022 1412   BILITOT 0.4 11/13/2022 1412   GFRNONAA >60 03/17/2023 2024   GFRAA >60 12/16/2018 0057   Lab Results  Component Value Date   CHOL 263 (H) 11/07/2022   HDL 73 11/07/2022   LDLCALC 174 (H) 11/07/2022   TRIG 80 11/07/2022   CHOLHDL 3.6 11/07/2022   Lab Results  Component Value Date   HGBA1C 5.9 (H) 11/07/2022   Lab Results  Component Value Date   VITAMINB12 496 04/05/2011   Lab Results  Component Value Date   TSH 3.777 11/07/2022   02/10/23  Component Ref Range & Units 3 mo ago  Hemoglobin A1c <5.7 % 6.5 High     04/04/11 MRI brain  Clinical Data: New onset left-sided weakness.  Nausea and dizziness.  IMPRESSION: Negative.   11/07/22 CT / CTA head / neck 1. Normal head CT. 2. Normal CTA of the head and neck.    ASSESSMENT AND PLAN  52 y.o. year old female here with:  Meds tried: gabapentin  Dx:  1. Pain in both lower extremities   2. Numbness and tingling of left side of face   3. Dysmetria   4. Left leg weakness     PLAN:  LEG  PAIN (below knees; could be related to musculoskeletal strain, obesity, arthritis, leg swelling; could be mild diabetic neuropathy, but symptoms not typical) - continue medical mgmt of diabetes and weight mgmt - consider MRI lumbar spine (pain with standing and walking; could rule out spinal stenosis; but patient denies any back pain or radicular pain) - consider duloxetine or pregabalin - consider PT evaluation / water therapy  Return for return to PCP,  pending if symptoms worsen or fail to improve.    Suanne Marker, MD 05/28/2023, 9:59 AM Certified in Neurology, Neurophysiology and Neuroimaging  [facility] [address] [place], [place] [address] [phone]

## 2023-06-19 ENCOUNTER — Other Ambulatory Visit: Payer: Self-pay | Admitting: Cardiovascular Disease

## 2023-12-15 NOTE — H&P (Signed)
 atientNaaman Boyd  PID: [id number]  DOB: 1971-06-26  SEX: Female   Patient referred by DDS for extraction tooth# 28.  CC: General dentist couldnt get it numb. Sent to Transport planner in [place]. They couldnt establish a IV access and attempted to extract under local anesthesia but were unable to get adequate local anesthesia.   Past Medical History:  High Blood Pressure, Asthma, Snoring, Sleep Apnea, Stroke, Diabetes, removable dental appliance, High Cholesterol, alcohol, Morbid Obesity    Medications: ascorbic acid, Losartan, Metformin, Meloxicam, Cholecalciferol, Ferrous sulfate, Rosuvastatin, Furosemide, Ozempic    Allergies:     NKDA    Surgeries:   Oral Surgery, uterine polyps     Social History       Smoking:            Alcohol: Drug use:                             Exam: BMI 68. Edentulous maxilla. Sutures at incision site right edentulous mandibular ridge.  No purulence, edema, fluctuance, trismus. Oral cancer screening negative. Pharynx clear. No lymphadenopathy.  Panorex: Retained root tooth # 28.   Assessment: ASA 3. Residual root tooth # 28.               Plan: Extraction Tooth # 28.  Hospital Day surgery.                 Rx: n               Risks and complications explained. Questions answered.   Georgia Lopes, DMD

## 2023-12-16 ENCOUNTER — Other Ambulatory Visit: Payer: Self-pay

## 2023-12-16 ENCOUNTER — Encounter (HOSPITAL_COMMUNITY): Payer: Self-pay | Admitting: Oral Surgery

## 2023-12-16 NOTE — Progress Notes (Signed)
 SDW CALL  Patient was given pre-op instructions over the phone. The opportunity was given for the patient to ask questions. No further questions asked. Patient verbalized understanding of instructions given.   PCP - Shon Baton Cardiologist - Dr. Rachelle Hora Croitoru - LOV - 05/21/23 - follow up on a as needed basis Neurologist - Dr. Bridgette Habermann Scales  PPM/ICD - denies  Chest x-ray - 03/17/23 EKG - 05/21/23 Stress Test -  ECHO - 11/07/22 Cardiac Cath - 01/12/17  Sleep Study - OSA+ and wears CPAP at night  Pt. Has DM2 but does not check blood sugars at home.  A1C was 5.9 on 10/26/23  Last dose of GLP1 agonist-  Ozempic - last dose was 3/27 - pt was instructed not to take Ozempic today or DOS   Blood Thinner Instructions: n/a  - pt has not taken Eliquis for at least 6 months - was discontinued by a provider Aspirin Instructions: n/a  ERAS Protcol - NPO   COVID TEST- n/a   Anesthesia review:  yes -   Patient denies shortness of breath, fever, cough and chest pain over the phone call   All instructions explained to the patient, with a verbal understanding of the material. Patient agrees to go over the instructions while at home for a better understanding.    Teach back was used to confirm arrival time and medications.

## 2023-12-16 NOTE — Anesthesia Preprocedure Evaluation (Signed)
 Anesthesia Evaluation  Patient identified by MRN, date of birth, ID band Patient awake    Reviewed: Allergy & Precautions, NPO status , Patient's Chart, lab work & pertinent test results  Airway Mallampati: III  TM Distance: >3 FB Neck ROM: Full    Dental no notable dental hx. (+) Poor Dentition   Pulmonary asthma , sleep apnea , former smoker   Pulmonary exam normal        Cardiovascular hypertension, + DVT   Rhythm:Regular Rate:Normal     Neuro/Psych  Headaches CVA  negative psych ROS   GI/Hepatic negative GI ROS, Neg liver ROS,,,  Endo/Other  diabetes, Type 2, Oral Hypoglycemic Agents  Class 4 obesity  Renal/GU   negative genitourinary   Musculoskeletal negative musculoskeletal ROS (+)    Abdominal Normal abdominal exam  (+)   Peds  Hematology  (+) Blood dyscrasia, anemia   Anesthesia Other Findings   Reproductive/Obstetrics                             Anesthesia Physical Anesthesia Plan  ASA: 3  Anesthesia Plan: General   Post-op Pain Management:    Induction: Intravenous  PONV Risk Score and Plan: 3 and Ondansetron, Dexamethasone, Midazolam and Treatment may vary due to age or medical condition  Airway Management Planned: Mask, Nasal ETT and Video Laryngoscope Planned  Additional Equipment: None  Intra-op Plan:   Post-operative Plan:   Informed Consent: I have reviewed the patients History and Physical, chart, labs and discussed the procedure including the risks, benefits and alternatives for the proposed anesthesia with the patient or authorized representative who has indicated his/her understanding and acceptance.     Dental advisory given  Plan Discussed with: CRNA  Anesthesia Plan Comments: (PAT note written 12/16/2023 by Shonna Chock, PA-C.  )       Anesthesia Quick Evaluation

## 2023-12-16 NOTE — Progress Notes (Signed)
 Anesthesia Chart Review: Terri Boyd  Case: [id number] Date/Time: 12/17/23 8469   Procedure: DENTAL RESTORATION/EXTRACTIONS   Anesthesia type: General   Diagnosis: Decay, teeth [K02.9]   Pre-op diagnosis: nonrestorable   [facility] ROOM 04 / [facility]   Surgeons: Ocie Doyne, DMD       DISCUSSION: Patient is a 53 year old female scheduled for the above procedure, extraction of retained root tooth #28 per H&P. BMI recorded as 68.   History includes former smoker, HTN, hypercholesterolemia, DM2, DVT (non-occlusive LLE DVT 04/05/11; question non-occlusive thrombus right mid femoral vein 03/17/23), TIA (04/04/11; TIA vs complex migraine 11/06/22), iron deficiency anemia, OSA (severe OSA 03/23/23, uses CPAP), migraines, morbid obesity. Mild CAD with 40% DIAG1 in 2018.   Last hematology follow-up was on 09/28/23 for DVT history (non-occlusive in 2012, and possible non-occlusive 03/2011 with negative CTA chest). She had been managed on Eliquis 5 mg BID. By notes, "Hypercoagulable state labs found an elevated IgG anticardiolipin antibodies of 20 on 03/22/2023, other labs were negative." Six month follow-up planned. She reported that she is now off Eliquis as it was discontinued by a provider, although I do not see documentation of this.   She had cardiology evaluation with Dr. Royann Shivers on 05/21/23 for suspected musculoskeletal chest pain. LHC in 2018 showed minor CAD. No additional testing recommended with as needed cardiology follow-up.  A1c 5.9%. Last Ozempic 12/09/23.   She is for labs and anesthesia team evaluation on the day of surgery.    VS:  Wt Readings from Last 3 Encounters:  05/28/23 (!) 176.1 kg  05/21/23 (!) 178.8 kg  03/17/23 (!) 183 kg   BP Readings from Last 3 Encounters:  05/28/23 (!) 166/92  05/21/23 137/86  03/17/23 (!) 149/90   Pulse Readings from Last 3 Encounters:  05/28/23 80  05/21/23 72  03/17/23 91     PROVIDERS: Shon Baton, PA-C is PCP ([facility]) -  Croitoru, Rachelle Hora, MD is cardiologist. Evaluation on 05/21/23 for suspected musculoskeletal chest pain. LHC in 2018 showed minor CAD. No additional testing recommended with as needed cardiology follow-up.  - Scales, Hopes, NP is neurology provider for OSA ([facility]) - Quentin Cornwall, FNP is weight management provider ([facility]) - Silvestre Moment, MD is HEM ([facility])   LABS: For day of surgery as indicated. As of 10/26/23, A1c 5.9%, WBC 7.2, H/H 12/36.7, PLT 373. On 09/28/23 Na 139, K 3.6, glucose 97, BUN 14, Cr 0.93, AST 13, ALT 15.     Split Sleep Study 03/23/23 ([facility]): IMPRESSIONS  - EKG showed no cardiac abnormalities.  - The patient snored with loud snoring volume.  - Severe Obstructive Sleep apnea OSA  AHI = 61.7. REM AHI = 85.7. Optimal pressure attained.  - Severe Oxygen Desaturation  - No significant periodic leg movements PLMs  during sleep. However, no significant associated arousals.  - Normal sleep efficiency, normal primary sleep latency, short REM sleep latency and short slow wave latency. RECOMMENDATIONS  - Trial of BiPAP ST therapy on 24-18 cm H2O BUR 13 with a Medium size Resmed Full Face Mirage Quattro mask and heated humidification...   IMAGES: CTA Chest 03/17/23: IMPRESSION: 1. No pulmonary embolism. 2. Lungs are clear. 3. Coronary artery atherosclerotic calcifications.  CT Head & CTA Head/Neck 11/07/22: IMPRESSION: 1. Normal head CT. 2. Normal CTA of the head and neck.    EKG: 05/21/23: NSR   CV: LE Venous US 06/03/23 ([facility]): Interpretation Summary  There is no evidence of DVT or superficial  vein thrombophlebitis in the lower extremities  bilaterally. Note  While no calf DVT was detected, a single Doppler study cannot exclude calf DVT  completely. If there is high clinical suspicion, consider a repeat study in 5-7 days to detect  proximal propagation of calf DVT. Marland Kitchen  There is NO sonographic evidence of DVT in either leg, however this study  is limited by body  habitus.    Echo 11/07/22: IMPRESSIONS   1. Left ventricular ejection fraction, by estimation, is 60 to 65%. The  left ventricle has normal function. The left ventricle has no regional  wall motion abnormalities. There is mild left ventricular hypertrophy.  Left ventricular diastolic parameters  are indeterminate.   2. Right ventricular systolic function is normal. The right ventricular  size is mildly enlarged. There is mildly elevated pulmonary artery  systolic pressure. The estimated right ventricular systolic pressure is  37.6 mmHg.   3. The mitral valve is grossly normal. Trivial mitral valve  regurgitation.   4. The aortic valve is tricuspid. Aortic valve regurgitation is not  visualized. Aortic valve sclerosis is present, with no evidence of aortic  valve stenosis. Aortic valve mean gradient measures 7.0 mmHg.   5. The inferior vena cava is normal in size with <50% respiratory  variability, suggesting right atrial pressure of 8 mmHg.    Cardiac cath 01/12/17: The coronaries are widely patent. Anatomy is codominant. Luminal regularities are noted in the proximal and mid LAD. First diagonal contains 40% narrowing. Luminal irregularities noted in the mid right. Circumflex contains no significant obstruction. Left ventricular pressures are normal. End-diastolic pressure is 11 mmHg. Anterior and inferobasal hyperkinesis is noted. Ejection fraction cannot be estimated due to poor opacification.   Past Medical History:  Diagnosis Date   Asthma    Diabetes mellitus without complication (HCC)    DVT (deep venous thrombosis) (HCC)    High cholesterol    Hypertension    Iron deficiency anemia    Migraine    Obesity    Sleep apnea    Stroke [facility])    TIA (transient ischemic attack)     Past Surgical History:  Procedure Laterality Date   HYSTEROSCOPY WITH D & C  05/14/2023   LEFT HEART CATH AND CORONARY ANGIOGRAPHY N/A 01/12/2017   Procedure: Left Heart  Cath and Coronary Angiography;  Surgeon: Lyn Records, MD;  [facility] INVASIVE CV LAB;  Service: Cardiovascular;  Laterality: N/A;   TUBAL LIGATION      MEDICATIONS: No current facility-administered medications for this encounter.    Ascorbic Acid (VITAMIN C) 1000 MG tablet   budesonide-formoterol (SYMBICORT) 160-4.5 MCG/ACT inhaler   D3-50 1.25 MG (50000 UT) capsule   ferrous sulfate 325 (65 FE) MG EC tablet   furosemide (LASIX) 20 MG tablet   levonorgestrel (MIRENA) 20 MCG/DAY IUD   losartan (COZAAR) 100 MG tablet   meloxicam (MOBIC) 15 MG tablet   metFORMIN (GLUCOPHAGE) 500 MG tablet   nitroGLYCERIN (NITROSTAT) 0.4 MG SL tablet   rosuvastatin (CRESTOR) 40 MG tablet   Semaglutide, 1 MG/DOSE, (OZEMPIC, 1 MG/DOSE,) 2 MG/1.5ML SOPN   apixaban (ELIQUIS) 5 MG TABS tablet   atorvastatin (LIPITOR) 40 MG tablet   She is not currently taking Lipitor or Eliqius.    Shonna Chock, PA-C Surgical Short Stay/Anesthesiology [facility] Phone [phone] [facility] Phone [phone] 12/16/2023 9:46 AM

## 2023-12-17 ENCOUNTER — Other Ambulatory Visit: Payer: Self-pay

## 2023-12-17 ENCOUNTER — Ambulatory Visit (HOSPITAL_BASED_OUTPATIENT_CLINIC_OR_DEPARTMENT_OTHER): Payer: Self-pay | Admitting: Vascular Surgery

## 2023-12-17 ENCOUNTER — Ambulatory Visit (HOSPITAL_COMMUNITY)
Admission: RE | Admit: 2023-12-17 | Discharge: 2023-12-17 | Disposition: A | Discharge status: Home / Self Care | Attending: Oral Surgery | Admitting: Oral Surgery

## 2023-12-17 ENCOUNTER — Ambulatory Visit (HOSPITAL_COMMUNITY): Payer: Self-pay | Admitting: Vascular Surgery

## 2023-12-17 ENCOUNTER — Encounter (HOSPITAL_COMMUNITY)
Admission: RE | Disposition: A | Discharge status: Home / Self Care | Payer: Self-pay | Source: Home / Self Care | Attending: Oral Surgery

## 2023-12-17 ENCOUNTER — Encounter (HOSPITAL_COMMUNITY): Payer: Self-pay | Admitting: Oral Surgery

## 2023-12-17 DIAGNOSIS — Z7985 Long-term (current) use of injectable non-insulin antidiabetic drugs: Secondary | ICD-10-CM | POA: Insufficient documentation

## 2023-12-17 DIAGNOSIS — J45909 Unspecified asthma, uncomplicated: Secondary | ICD-10-CM

## 2023-12-17 DIAGNOSIS — Z87891 Personal history of nicotine dependence: Secondary | ICD-10-CM | POA: Diagnosis not present

## 2023-12-17 DIAGNOSIS — E119 Type 2 diabetes mellitus without complications: Secondary | ICD-10-CM | POA: Diagnosis not present

## 2023-12-17 DIAGNOSIS — Z6841 Body Mass Index (BMI) 40.0 and over, adult: Secondary | ICD-10-CM | POA: Insufficient documentation

## 2023-12-17 DIAGNOSIS — D509 Iron deficiency anemia, unspecified: Secondary | ICD-10-CM | POA: Diagnosis not present

## 2023-12-17 DIAGNOSIS — E7800 Pure hypercholesterolemia, unspecified: Secondary | ICD-10-CM | POA: Insufficient documentation

## 2023-12-17 DIAGNOSIS — I2510 Atherosclerotic heart disease of native coronary artery without angina pectoris: Secondary | ICD-10-CM | POA: Diagnosis not present

## 2023-12-17 DIAGNOSIS — Z8673 Personal history of transient ischemic attack (TIA), and cerebral infarction without residual deficits: Secondary | ICD-10-CM | POA: Diagnosis not present

## 2023-12-17 DIAGNOSIS — G4733 Obstructive sleep apnea (adult) (pediatric): Secondary | ICD-10-CM | POA: Diagnosis not present

## 2023-12-17 DIAGNOSIS — K029 Dental caries, unspecified: Secondary | ICD-10-CM

## 2023-12-17 DIAGNOSIS — K083 Retained dental root: Secondary | ICD-10-CM | POA: Insufficient documentation

## 2023-12-17 DIAGNOSIS — I10 Essential (primary) hypertension: Secondary | ICD-10-CM | POA: Diagnosis not present

## 2023-12-17 DIAGNOSIS — Z7984 Long term (current) use of oral hypoglycemic drugs: Secondary | ICD-10-CM | POA: Diagnosis not present

## 2023-12-17 DIAGNOSIS — Z86718 Personal history of other venous thrombosis and embolism: Secondary | ICD-10-CM | POA: Diagnosis not present

## 2023-12-17 HISTORY — DX: Iron deficiency anemia, unspecified: D50.9

## 2023-12-17 HISTORY — DX: Type 2 diabetes mellitus without complications: E11.9

## 2023-12-17 HISTORY — DX: Sleep apnea, unspecified: G47.30

## 2023-12-17 HISTORY — PX: TOOTH EXTRACTION: SHX859

## 2023-12-17 LAB — CBC
HCT: 36.3 % (ref 36.0–46.0)
Hemoglobin: 11.5 g/dL — ABNORMAL LOW (ref 12.0–15.0)
MCH: 25.4 pg — ABNORMAL LOW (ref 26.0–34.0)
MCHC: 31.7 g/dL (ref 30.0–36.0)
MCV: 80.1 fL (ref 80.0–100.0)
Platelets: 312 10*3/uL (ref 150–400)
RBC: 4.53 MIL/uL (ref 3.87–5.11)
RDW: 17.2 % — ABNORMAL HIGH (ref 11.5–15.5)
WBC: 6.8 10*3/uL (ref 4.0–10.5)
nRBC: 0 % (ref 0.0–0.2)

## 2023-12-17 LAB — POCT PREGNANCY, URINE: Preg Test, Ur: NEGATIVE

## 2023-12-17 LAB — BASIC METABOLIC PANEL WITH GFR
Anion gap: 10 (ref 5–15)
BUN: 14 mg/dL (ref 6–20)
CO2: 24 mmol/L (ref 22–32)
Calcium: 9.1 mg/dL (ref 8.9–10.3)
Chloride: 107 mmol/L (ref 98–111)
Creatinine, Ser: 0.98 mg/dL (ref 0.44–1.00)
GFR, Estimated: >60 mL/min (ref >60)
Glucose, Bld: 91 mg/dL (ref 70–99)
Potassium: 3.4 mmol/L — ABNORMAL LOW (ref 3.5–5.1)
Sodium: 141 mmol/L (ref 135–145)

## 2023-12-17 LAB — GLUCOSE, CAPILLARY
Glucose-Capillary: 93 mg/dL (ref 70–99)
Glucose-Capillary: 126 mg/dL — ABNORMAL HIGH (ref 70–99)

## 2023-12-17 SURGERY — DENTAL RESTORATION/EXTRACTIONS
Anesthesia: General | Site: Mouth

## 2023-12-17 MED ORDER — CEFAZOLIN SODIUM-DEXTROSE 3-4 GM/150ML-% IV SOLN
3.0000 g | INTRAVENOUS | Status: AC
  Administered 2023-12-17: 3 g via INTRAVENOUS
  Filled 2023-12-17: qty 150

## 2023-12-17 MED ORDER — INSULIN ASPART 100 UNIT/ML IJ SOLN: 0.0000 [IU] | INTRAMUSCULAR | Status: DC | PRN

## 2023-12-17 MED ORDER — PROPOFOL 10 MG/ML IV BOLUS
INTRAVENOUS | Status: DC | PRN
  Administered 2023-12-17: 200 mg via INTRAVENOUS

## 2023-12-17 MED ORDER — MIDAZOLAM HCL 2 MG/2ML IJ SOLN
INTRAMUSCULAR | Status: DC | PRN
  Administered 2023-12-17 (×2): 1 mg via INTRAVENOUS

## 2023-12-17 MED ORDER — MIDAZOLAM HCL 2 MG/2ML IJ SOLN
INTRAMUSCULAR | Status: AC
  Filled 2023-12-17: qty 2

## 2023-12-17 MED ORDER — DEXAMETHASONE SODIUM PHOSPHATE 10 MG/ML IJ SOLN
INTRAMUSCULAR | Status: AC
  Filled 2023-12-17: qty 1

## 2023-12-17 MED ORDER — CHLORHEXIDINE GLUCONATE 0.12 % MT SOLN
15.0000 mL | Freq: Once | OROMUCOSAL | Status: AC
  Administered 2023-12-17: 15 mL via OROMUCOSAL
  Filled 2023-12-17: qty 15

## 2023-12-17 MED ORDER — SUCCINYLCHOLINE CHLORIDE 200 MG/10ML IV SOSY
PREFILLED_SYRINGE | INTRAVENOUS | Status: DC | PRN
  Administered 2023-12-17: 200 mg via INTRAVENOUS

## 2023-12-17 MED ORDER — ONDANSETRON HCL 4 MG/2ML IJ SOLN
INTRAMUSCULAR | Status: AC
  Filled 2023-12-17: qty 2

## 2023-12-17 MED ORDER — LIDOCAINE-EPINEPHRINE 2 %-1:100000 IJ SOLN
INTRAMUSCULAR | Status: DC | PRN
Start: 2023-12-17 — End: 2023-12-17
  Administered 2023-12-17: 8 mL

## 2023-12-17 MED ORDER — FENTANYL CITRATE (PF) 250 MCG/5ML IJ SOLN
INTRAMUSCULAR | Status: DC | PRN
Start: 2023-12-17 — End: 2023-12-17
  Administered 2023-12-17: 100 ug via INTRAVENOUS

## 2023-12-17 MED ORDER — ROCURONIUM BROMIDE 10 MG/ML (PF) SYRINGE
PREFILLED_SYRINGE | INTRAVENOUS | Status: DC | PRN
  Administered 2023-12-17: 20 mg via INTRAVENOUS

## 2023-12-17 MED ORDER — 0.9 % SODIUM CHLORIDE (POUR BTL) OPTIME
TOPICAL | Status: DC | PRN
  Administered 2023-12-17: 1000 mL

## 2023-12-17 MED ORDER — ORAL CARE MOUTH RINSE: 15.0000 mL | Freq: Once | OROMUCOSAL | Status: AC

## 2023-12-17 MED ORDER — SUGAMMADEX SODIUM 200 MG/2ML IV SOLN
INTRAVENOUS | Status: DC | PRN
  Administered 2023-12-17: 400 mg via INTRAVENOUS

## 2023-12-17 MED ORDER — HYDROCODONE-ACETAMINOPHEN 5-325 MG PO TABS: 1.0000 | ORAL_TABLET | ORAL | 0 refills | Status: AC | PRN

## 2023-12-17 MED ORDER — LIDOCAINE 2% (20 MG/ML) 5 ML SYRINGE
INTRAMUSCULAR | Status: AC
  Filled 2023-12-17: qty 5

## 2023-12-17 MED ORDER — ONDANSETRON HCL 4 MG/2ML IJ SOLN
INTRAMUSCULAR | Status: DC | PRN
Start: 2023-12-17 — End: 2023-12-17
  Administered 2023-12-17: 4 mg via INTRAVENOUS

## 2023-12-17 MED ORDER — PHENYLEPHRINE 80 MCG/ML (10ML) SYRINGE FOR IV PUSH (FOR BLOOD PRESSURE SUPPORT)
PREFILLED_SYRINGE | INTRAVENOUS | Status: AC
  Filled 2023-12-17: qty 10

## 2023-12-17 MED ORDER — LIDOCAINE-EPINEPHRINE 2 %-1:100000 IJ SOLN
INTRAMUSCULAR | Status: AC
  Filled 2023-12-17: qty 1

## 2023-12-17 MED ORDER — ROCURONIUM BROMIDE 10 MG/ML (PF) SYRINGE
PREFILLED_SYRINGE | INTRAVENOUS | Status: AC
  Filled 2023-12-17: qty 10

## 2023-12-17 MED ORDER — SODIUM CHLORIDE 0.9 % IR SOLN
Status: DC | PRN
  Administered 2023-12-17: 1000 mL

## 2023-12-17 MED ORDER — PROPOFOL 10 MG/ML IV BOLUS
INTRAVENOUS | Status: AC
  Filled 2023-12-17: qty 20

## 2023-12-17 MED ORDER — AMOXICILLIN 500 MG PO CAPS: 500.0000 mg | ORAL_CAPSULE | Freq: Three times a day (TID) | ORAL | 0 refills | Status: AC

## 2023-12-17 MED ORDER — LABETALOL HCL 5 MG/ML IV SOLN
INTRAVENOUS | Status: DC | PRN
  Administered 2023-12-17: 5 mg via INTRAVENOUS

## 2023-12-17 MED ORDER — DEXAMETHASONE SODIUM PHOSPHATE 10 MG/ML IJ SOLN
INTRAMUSCULAR | Status: DC | PRN
  Administered 2023-12-17: 5 mg via INTRAVENOUS

## 2023-12-17 MED ORDER — LABETALOL HCL 5 MG/ML IV SOLN
INTRAVENOUS | Status: AC
  Filled 2023-12-17: qty 4

## 2023-12-17 MED ORDER — LIDOCAINE 2% (20 MG/ML) 5 ML SYRINGE
INTRAMUSCULAR | Status: DC | PRN
  Administered 2023-12-17: 100 mg via INTRAVENOUS

## 2023-12-17 MED ORDER — LACTATED RINGERS IV SOLN: INTRAVENOUS | Status: DC

## 2023-12-17 MED ORDER — FENTANYL CITRATE (PF) 250 MCG/5ML IJ SOLN
INTRAMUSCULAR | Status: AC
  Filled 2023-12-17: qty 5

## 2023-12-17 SURGICAL SUPPLY — 26 items
BAG COUNTER SPONGE SURGICOUNT (BAG) IMPLANT
BLADE SURG 15 STRL LF DISP TIS (BLADE) ×1 IMPLANT
BUR CROSS CUT FISSURE 1.6 (BURR) ×1 IMPLANT
BUR EGG ELITE 4.0 (BURR) IMPLANT
CANISTER SUCT 3000ML PPV (MISCELLANEOUS) ×1 IMPLANT
COVER SURGICAL LIGHT HANDLE (MISCELLANEOUS) ×1 IMPLANT
GAUZE PACKING FOLDED 2 STR (GAUZE/BANDAGES/DRESSINGS) ×1 IMPLANT
GLOVE BIO SURGEON STRL SZ8 (GLOVE) ×1 IMPLANT
GOWN STRL REUS W/ TWL LRG LVL3 (GOWN DISPOSABLE) ×1 IMPLANT
GOWN STRL REUS W/ TWL XL LVL3 (GOWN DISPOSABLE) ×1 IMPLANT
IV NS 1000ML BAXH (IV SOLUTION) ×1 IMPLANT
KIT BASIN OR (CUSTOM PROCEDURE TRAY) ×1 IMPLANT
KIT TURNOVER KIT B (KITS) ×1 IMPLANT
NDL HYPO 25GX1X1/2 BEV (NEEDLE) ×2 IMPLANT
NEEDLE HYPO 25GX1X1/2 BEV (NEEDLE) ×2 IMPLANT
NS IRRIG 1000ML POUR BTL (IV SOLUTION) ×1 IMPLANT
PAD ARMBOARD POSITIONER FOAM (MISCELLANEOUS) ×1 IMPLANT
SLEEVE IRRIGATION ELITE 7 (MISCELLANEOUS) ×1 IMPLANT
SPIKE FLUID TRANSFER (MISCELLANEOUS) IMPLANT
SUT CHROMIC 3 0 SH 27 (SUTURE) IMPLANT
SUT PLAIN 3 0 PS2 27 (SUTURE) IMPLANT
SYR BULB IRRIG 60ML STRL (SYRINGE) ×1 IMPLANT
SYR CONTROL 10ML LL (SYRINGE) ×1 IMPLANT
TRAY ENT MC OR (CUSTOM PROCEDURE TRAY) ×1 IMPLANT
TUBING IRRIGATION (MISCELLANEOUS) ×1 IMPLANT
YANKAUER SUCT BULB TIP NO VENT (SUCTIONS) ×1 IMPLANT

## 2023-12-17 NOTE — Anesthesia Procedure Notes (Addendum)
 Procedure Name: Intubation Date/Time: 12/17/2023 9:03 AM  Performed by: Berniece Pap, CRNAPre-anesthesia Checklist: Patient identified, Emergency Drugs available, Suction available and Patient being monitored Patient Re-evaluated:Patient Re-evaluated prior to induction Oxygen Delivery Method: Circle system utilized Preoxygenation: Pre-oxygenation with 100% oxygen Induction Type: IV induction Ventilation: Two handed mask ventilation required Laryngoscope Size: Mac and 4 Grade View: Grade I Tube type: Oral Rae Tube size: 7.0 mm Number of attempts: 2 (First attempt by SRNA, vocal cords closed; Resolved by the second DL attempt by CRNA) Airway Equipment and Method: Stylet Placement Confirmation: ETT inserted through vocal cords under direct vision, positive ETCO2 and breath sounds checked- equal and bilateral Tube secured with: Tape Dental Injury: Teeth and Oropharynx as per pre-operative assessment

## 2023-12-17 NOTE — Op Note (Signed)
 12/17/2023  9:28 AM  PATIENT:  Terri Boyd  53 y.o. female  PRE-OPERATIVE DIAGNOSIS:  RESIDUAL ROOT TOOTH # 28.  POST-OPERATIVE DIAGNOSIS:  SAME  PROCEDURE:  Procedure(s): DENTALEXTRACTION TOOTH NUMBER TWENTY-EIGHT  SURGEON:  Surgeon(s): Ocie Doyne, DMD  ANESTHESIA:   local and general  EBL:  minimal  DRAINS: none   SPECIMEN:  No Specimen  COUNTS:  YES  PLAN OF CARE: Discharge to home after PACU  PATIENT DISPOSITION:  PACU - hemodynamically stable.   PROCEDURE DETAILS: Dictation # [id number]  Georgia Lopes, DMD 12/17/2023 9:28 AM

## 2023-12-17 NOTE — Op Note (Signed)
 [name], [name] MEDICAL RECORD NO: [id number] ACCOUNT NO: [id number] DATE OF BIRTH: 1970-10-24 FACILITY: [facility] LOCATION: [facility] PHYSICIAN: Georgia Lopes, DDS  Operative Report   DATE OF PROCEDURE: 12/17/2023  PREOPERATIVE DIAGNOSES: 1.  Residual root tooth #28. 2.  Morbid obesity.  POSTOPERATIVE DIAGNOSES: 1.  Residual root tooth #28. 2.  Morbid obesity.  PROCEDURE:  Extraction tooth #28.  SURGEON:  Georgia Lopes, DDS  ANESTHESIA:  General, oral intubation Dr. Shelah Lewandowsky  attending  DESCRIPTION OF PROCEDURE:  The patient was taken to the operating room and placed on the table in supine position.  General anesthesia was administered.  An oral endotracheal tube was placed and secured.  The eyes were protected.  The patient was draped  for surgery.  Timeout was performed.  The posterior pharynx was suctioned and a throat pack was placed.  2% lidocaine with 1:100,000 epinephrine was infiltrated in a right inferior alveolar block with buccal and lingual infiltration around the embedded  tooth #28.  A bite block was placed in the left side of the mouth.  A Sweetheart retractor was used to retract the tongue.  A 15 blade was used to make an incision on the alveolar crest in the molar region and carried forward to tooth #27 and then was  taken both lingually and buccally in the gingival sulcus to reflect the papilla between teeth numbers 26 and 27.  The periosteum was reflected.  The Stryker handpiece was used with a fissure bur under irrigation to remove the bone until the root was  discovered.  Then, bone was removed circumferentially around the root and the root was elevated with a 301 elevator and then removed from the mouth with a 301 elevator and rongeur.  The socket was curetted.  The bone edges were smoothed with a bone file.   Then, the area was irrigated and closed with 3-0 chromic.  The oral cavity was then irrigated and suctioned.  The throat pack was removed.  The  patient was left in the care of anesthesia for extubation and transported to recovery with plans for  discharge home through day surgery.  ESTIMATED BLOOD LOSS:  Minimum.  COMPLICATIONS:  None.  SPECIMENS:  None.  COUNTS:  Correct.   VAI D: 12/17/2023 9:30:30 am T: 12/17/2023 9:39:00 am  JOB: [id number]/ [id number]

## 2023-12-17 NOTE — Anesthesia Postprocedure Evaluation (Signed)
 Anesthesia Post Note  Patient: Terri Boyd  Procedure(s) Performed: DENTAL RESTORATION/EXTRACTIONS TOOTH NUMBER TWENTY-EIGHT (Mouth)     Patient location during evaluation: PACU Anesthesia Type: General Level of consciousness: awake and alert Pain management: pain level controlled Vital Signs Assessment: post-procedure vital signs reviewed and stable Respiratory status: spontaneous breathing, nonlabored ventilation, respiratory function stable and patient connected to nasal cannula oxygen Cardiovascular status: blood pressure returned to baseline and stable Postop Assessment: no apparent nausea or vomiting Anesthetic complications: no   There were no known notable events for this encounter.  Last Vitals:  Vitals:   12/17/23 1045 12/17/23 1100  BP: (!) 145/85 (!) 145/85  Pulse: 63 65  Resp: (!) 23 (!) 24  Temp:  36.8 C  SpO2: 93% 92%    Last Pain:  Vitals:   12/17/23 1100  TempSrc:   PainSc: 0-No pain                 Earl Lites P Odean Fester

## 2023-12-17 NOTE — H&P (Signed)
 H&P documentation  -History and Physical Reviewed  -Patient has been re-examined  -No change in the plan of care  Terri Boyd

## 2023-12-17 NOTE — Transfer of Care (Signed)
 Immediate Anesthesia Transfer of Care Note  Patient: Terri Boyd  Procedure(s) Performed: DENTAL RESTORATION/EXTRACTIONS TOOTH NUMBER TWENTY-EIGHT (Mouth)  Patient Location: PACU  Anesthesia Type:General  Level of Consciousness: drowsy  Airway & Oxygen Therapy: Patient Spontanous Breathing and Patient connected to face mask oxygen  Post-op Assessment: Report given to RN and Post -op Vital signs reviewed and stable  Post vital signs: Reviewed and stable  Last Vitals:  Vitals Value Taken Time  BP 180/88 12/17/23 0941  Temp 36.8 C 12/17/23 0940  Pulse 71 12/17/23 0945  Resp 20 12/17/23 0945  SpO2 94 % 12/17/23 0945  Vitals shown include unfiled device data.  Last Pain:  Vitals:   12/17/23 0656  TempSrc:   PainSc: 0-No pain         Complications: There were no known notable events for this encounter.

## 2023-12-18 ENCOUNTER — Encounter (HOSPITAL_COMMUNITY): Payer: Self-pay | Admitting: Oral Surgery

## 2024-01-17 ENCOUNTER — Encounter (HOSPITAL_BASED_OUTPATIENT_CLINIC_OR_DEPARTMENT_OTHER): Payer: Self-pay

## 2024-01-17 ENCOUNTER — Telehealth: Payer: Self-pay | Admitting: Cardiovascular Disease

## 2024-01-17 ENCOUNTER — Emergency Department (HOSPITAL_BASED_OUTPATIENT_CLINIC_OR_DEPARTMENT_OTHER)

## 2024-01-17 ENCOUNTER — Other Ambulatory Visit: Payer: Self-pay

## 2024-01-17 ENCOUNTER — Emergency Department (HOSPITAL_BASED_OUTPATIENT_CLINIC_OR_DEPARTMENT_OTHER)
Admission: EM | Admit: 2024-01-17 | Discharge: 2024-01-17 | Disposition: A | Discharge status: Home / Self Care | Attending: Emergency Medicine | Admitting: Emergency Medicine

## 2024-01-17 DIAGNOSIS — Z7901 Long term (current) use of anticoagulants: Secondary | ICD-10-CM | POA: Diagnosis not present

## 2024-01-17 DIAGNOSIS — Z79899 Other long term (current) drug therapy: Secondary | ICD-10-CM | POA: Diagnosis not present

## 2024-01-17 DIAGNOSIS — R0789 Other chest pain: Secondary | ICD-10-CM | POA: Insufficient documentation

## 2024-01-17 DIAGNOSIS — E119 Type 2 diabetes mellitus without complications: Secondary | ICD-10-CM | POA: Insufficient documentation

## 2024-01-17 DIAGNOSIS — J45909 Unspecified asthma, uncomplicated: Secondary | ICD-10-CM | POA: Insufficient documentation

## 2024-01-17 DIAGNOSIS — Z7984 Long term (current) use of oral hypoglycemic drugs: Secondary | ICD-10-CM | POA: Insufficient documentation

## 2024-01-17 DIAGNOSIS — I10 Essential (primary) hypertension: Secondary | ICD-10-CM | POA: Insufficient documentation

## 2024-01-17 LAB — BASIC METABOLIC PANEL WITH GFR
Anion gap: 12 (ref 5–15)
BUN: 14 mg/dL (ref 6–20)
CO2: 25 mmol/L (ref 22–32)
Calcium: 9.4 mg/dL (ref 8.9–10.3)
Chloride: 101 mmol/L (ref 98–111)
Creatinine, Ser: 0.98 mg/dL (ref 0.44–1.00)
GFR, Estimated: >60 mL/min (ref >60)
Glucose, Bld: 98 mg/dL (ref 70–99)
Potassium: 3.8 mmol/L (ref 3.5–5.1)
Sodium: 139 mmol/L (ref 135–145)

## 2024-01-17 LAB — PRO BRAIN NATRIURETIC PEPTIDE: Pro Brain Natriuretic Peptide: <36 pg/mL (ref <300.0)

## 2024-01-17 LAB — CBC
HCT: 36.4 % (ref 36.0–46.0)
Hemoglobin: 11.6 g/dL — ABNORMAL LOW (ref 12.0–15.0)
MCH: 25.6 pg — ABNORMAL LOW (ref 26.0–34.0)
MCHC: 31.9 g/dL (ref 30.0–36.0)
MCV: 80.2 fL (ref 80.0–100.0)
Platelets: 320 10*3/uL (ref 150–400)
RBC: 4.54 MIL/uL (ref 3.87–5.11)
RDW: 17.4 % — ABNORMAL HIGH (ref 11.5–15.5)
WBC: 8.5 10*3/uL (ref 4.0–10.5)
nRBC: 0 % (ref 0.0–0.2)

## 2024-01-17 LAB — D-DIMER, QUANTITATIVE: D-Dimer, Quant: 0.38 ug{FEU}/mL (ref 0.00–0.50)

## 2024-01-17 LAB — TROPONIN T, HIGH SENSITIVITY: Troponin T High Sensitivity: <15 ng/L (ref <19)

## 2024-01-17 MED ORDER — ACETAMINOPHEN 500 MG PO TABS
1000.0000 mg | ORAL_TABLET | Freq: Once | ORAL | Status: AC
  Administered 2024-01-17: 1000 mg via ORAL
  Filled 2024-01-17: qty 2

## 2024-01-17 MED ORDER — ALUM & MAG HYDROXIDE-SIMETH 200-200-20 MG/5ML PO SUSP
30.0000 mL | Freq: Once | ORAL | Status: AC
  Administered 2024-01-17: 30 mL via ORAL
  Filled 2024-01-17: qty 30

## 2024-01-17 MED ORDER — LIDOCAINE VISCOUS HCL 2 % MT SOLN
15.0000 mL | Freq: Once | OROMUCOSAL | Status: AC
  Administered 2024-01-17: 15 mL via ORAL
  Filled 2024-01-17: qty 15

## 2024-01-17 NOTE — Discharge Instructions (Signed)
 Thank you for coming to [facility]. You were seen for chest pain. We did an exam, labs, and imaging, and these showed no acute findings.  Please follow up with your cardiologist within 1 week.  You can take Tylenol  1000 mg every 8 hours for pain or discomfort.  Do not hesitate to return to the ED or call 911 if you experience: -Worsening symptoms -Lightheadedness, passing out -Fevers/chills -Anything else that concerns you

## 2024-01-17 NOTE — Telephone Encounter (Signed)
 Patient verbalized started yesterday  and  has been off and on today. Denies chest pain, dizziness, headache, SOB.  Patient verbalizes she is unable to check blood pressure. Patient reports feels like something is heaviness on left side of chest. Advise patient that we don't have any appointment with Croitoru or APP today. Advise patient to call 911 or go to emergency. Patient verbalized she will try to follow up with PCP or urgent care and if unable to get appointment, she will go to [facility] care Emergency Room.

## 2024-01-17 NOTE — ED Provider Notes (Signed)
 [facility] Provider Note   CSN: [id number] Arrival date & time: 01/17/24  1649     History  Chief Complaint  Patient presents with   Chest Pain    Terri Boyd is a 53 y.o. female with PMH as listed below who presents with CP.  Yesterday evening was sitting on couch and had a few sharp pains in her left chest that took her breath away, pain passed quickly. Today having chest heaviness. Denies N/V, diaphoresis, SOB, cough, flu-like sxs, abdominal pain. Has also had pain and swelling in right leg, hx of DVT. States did US  of leg last week and was negative for DVT. Had been taking eliquis  but didn't take it for awhile, has been back on it for 2 weeks. No recent travel, hospitalizations, surgeries, immobilizations. No h/o MI. No trauma or falls. No numbness/tingling.    Past Medical History:  Diagnosis Date   Asthma    Diabetes mellitus without complication (HCC)    DVT (deep venous thrombosis) (HCC)    High cholesterol    Hypertension    Iron deficiency anemia    Migraine    Obesity    Sleep apnea    Stroke (HCC)    TIA (transient ischemic attack)        Home Medications Prior to Admission medications   Medication Sig Start Date End Date Taking? Authorizing Provider  amoxicillin  (AMOXIL ) 500 MG capsule Take 1 capsule (500 mg total) by mouth 3 (three) times daily. 12/17/23   Ascencion Lava, DMD  apixaban  (ELIQUIS ) 5 MG TABS tablet Take 5 mg by mouth 2 (two) times daily. Patient not taking: Reported on 12/14/2023 04/13/23   [provider]  Ascorbic Acid (VITAMIN C) 1000 MG tablet Take 1,000 mg by mouth daily.    [provider]  atorvastatin  (LIPITOR) 40 MG tablet TAKE 1 TABLET BY MOUTH DAILY AT 6 PM. Patient not taking: Reported on 12/14/2023 06/22/23   Croitoru, Mihai, MD  budesonide-formoterol (SYMBICORT) 160-4.5 MCG/ACT inhaler Inhale 2 puffs into the lungs daily as needed (asthma). 10/26/23   [provider]  D3-50 1.25 MG (50000 UT) capsule Take 50,000 Units by mouth once a week. 05/12/23   [provider]  ferrous sulfate 325 (65 FE) MG EC tablet Take 325 mg by mouth every other day.    [provider]  furosemide  (LASIX ) 20 MG tablet Take 20 mg by mouth every other day.    [provider]  levonorgestrel (MIRENA) 20 MCG/DAY IUD 1 each by Intrauterine route once.    [provider]  losartan (COZAAR) 100 MG tablet Take 100 mg by mouth daily. 03/26/23   [provider]  meloxicam (MOBIC) 15 MG tablet Take 15 mg by mouth daily.    [provider]  metFORMIN (GLUCOPHAGE) 500 MG tablet Take 500 mg by mouth daily. 02/12/23   [provider]  nitroGLYCERIN  (NITROSTAT ) 0.4 MG SL tablet Place 1 tablet (0.4 mg total) under the tongue every 5 (five) minutes as needed for chest pain (CP or SOB). 11/07/22   Audria Leather, MD  rosuvastatin (CRESTOR) 40 MG tablet Take 40 mg by mouth daily.    [provider]  Semaglutide, 1 MG/DOSE, (OZEMPIC, 1 MG/DOSE,) 2 MG/1.5ML SOPN Inject 1 mg into the skin once a week.    [provider]      Allergies    Patient has no known allergies.    Review of Systems  Review of Systems A 10 point review of systems was performed and is negative unless otherwise reported in HPI.  Physical Exam Updated Vital Signs BP (!) 133/56   Pulse 87   Temp 98.9 F (37.2 C) (Oral)   Resp 18   Ht 5\' 4"  (1.626 m)   Wt (!) 172.4 kg   SpO2 97%   BMI 65.23 kg/m  Physical Exam General: Normal appearing female, lying in bed.  HEENT:  Sclera anicteric, MMM, trachea midline.  Cardiology: RRR, no murmurs/rubs/gallops.  Resp: Normal respiratory rate and effort. CTAB, no wheezes, rhonchi, crackles.  Abd: Soft, non-tender, non-distended. No rebound tenderness or guarding.  GU: Deferred. MSK: No peripheral edema or signs of trauma. Extremities without deformity or TTP. No cyanosis or clubbing. Skin: warm, dry.   Neuro: A&Ox4, CNs II-XII grossly intact. MAEs. Sensation grossly intact.  Psych: Normal mood and affect.   ED Results / Procedures / Treatments   Labs (all labs ordered are listed, but only abnormal results are displayed) Labs Reviewed  CBC - Abnormal; Notable for the following components:      Result Value   Hemoglobin 11.6 (*)    MCH 25.6 (*)    RDW 17.4 (*)    All other components within normal limits  BASIC METABOLIC PANEL WITH GFR  D-DIMER, QUANTITATIVE  PRO BRAIN NATRIURETIC PEPTIDE  PREGNANCY, URINE  TROPONIN T, HIGH SENSITIVITY  TROPONIN T, HIGH SENSITIVITY    EKG EKG Interpretation Date/Time:  Monday Jan 17 2024 16:57:32 EDT Ventricular Rate:  97 PR Interval:  140 QRS Duration:  96 QT Interval:  352 QTC Calculation: 448 R Axis:   22  Text Interpretation: Sinus rhythm Low voltage, precordial leads Confirmed by Annita Kindle [phone]) on 01/17/2024 5:57:08 PM  Radiology DG Chest 2 View Result Date: 01/17/2024 CLINICAL DATA:  Chest heaviness EXAM: CHEST - 2 VIEW COMPARISON:  Chest x-ray 03/17/2023 FINDINGS: The heart size and mediastinal contours are within normal limits. Both lungs are clear. The visualized skeletal structures are unremarkable. IMPRESSION: No active cardiopulmonary disease. Electronically Signed   By: Tyron Gallon M.D.   On: 01/17/2024 17:38    Procedures Procedures    Medications Ordered in ED Medications  alum & mag hydroxide-simeth (MAALOX/MYLANTA) 200-200-20 MG/5ML suspension 30 mL (30 mLs Oral Given 01/17/24 1855)    And  lidocaine  (XYLOCAINE ) 2 % viscous mouth solution 15 mL (15 mLs Oral Given 01/17/24 1855)  acetaminophen  (TYLENOL ) tablet 1,000 mg (1,000 mg Oral Given 01/17/24 1855)    ED Course/ Medical Decision Making/ A&P                          Medical Decision Making Amount and/or Complexity of Data Reviewed Labs: ordered. Decision-making details documented in ED Course. Radiology: ordered. Decision-making details documented in ED  Course.  Risk OTC drugs. Prescription drug management.    This patient presents to the ED for concern of chest pain, this involves an extensive number of treatment options, and is a complaint that carries with it a high risk of complications and morbidity.  I considered the following differential and admission for this acute, potentially life threatening condition.  Overall patient is very well-appearing and hemodynamically stable.  MDM:    DDX for chest pain includes but is not limited to:  Very low suspicion for ACS given presenting sx. EKG without any signs of pericarditis, ischemia, arrhythmia.  Trop negative.  Patient cannot PERC out based on age,, does have  risk factors and only recently restarted her eliquis , had neg DVT US  but may need to repeat, will obtain D dimer. No c/f dissection. No abdominal pain and no c/f biliary disease. If dimer + will repeat DVT US . Patient's body habitus could lead to false negative.  Consider GERD.  Patient is given a GI cocktail which does not really help.  Reassuringly, dimer is negative.  This and, nation with a recent negative DVT ultrasound is reassuring.  She has no tachycardia, tachypnea, hypoxia, or shortness of breath that would indicate a PE, and her dimer is negative.  She is also on restarted her Eliquis .  Overall low concern for DVT or PE.  Overall doubt a life-threatening etiology of her chest pain.   Clinical Course as of 01/17/24 2333  Mon Jan 17, 2024  1758 CBC(!) CBC, BMP unreamrakble [HN]  1758 DG Chest 2 View wnl [HN]  1838 D-Dimer, Quant: 0.38 Negative dimer [HN]  1838 Pro Brain Natriuretic Peptide: <36.0 neg [HN]  1848 Heart score is low. Patient can follow up with her cardiologist. Advised tylenol  for discomfort. Given specific discharge instructions/return precautions. [HN]    Clinical Course User Index [HN] Merdis Stalling, MD    Labs: I Ordered, and personally interpreted labs.  The pertinent results include:   those listed aobve  Imaging Studies ordered: I ordered imaging studies including CXR I independently visualized and interpreted imaging. I agree with the radiologist interpretation  Additional history obtained from chart review.    Cardiac Monitoring: The patient was maintained on a cardiac monitor.  I personally viewed and interpreted the cardiac monitored which showed an underlying rhythm of: NSR  Reevaluation: After the interventions noted above, I reevaluated the patient and found that they have :improved  Social Determinants of Health: Lives independently  Disposition:  DC w/ discharge instructions/return precautions. All questions answered to patient's satisfaction.    Co morbidities that complicate the patient evaluation  Past Medical History:  Diagnosis Date   Asthma    Diabetes mellitus without complication (HCC)    DVT (deep venous thrombosis) (HCC)    High cholesterol    Hypertension    Iron deficiency anemia    Migraine    Obesity    Sleep apnea    Stroke (HCC)    TIA (transient ischemic attack)      Medicines Meds ordered this encounter  Medications   AND Linked Order Group    alum & mag hydroxide-simeth (MAALOX/MYLANTA) 200-200-20 MG/5ML suspension 30 mL    lidocaine  (XYLOCAINE ) 2 % viscous mouth solution 15 mL   acetaminophen  (TYLENOL ) tablet 1,000 mg    I have reviewed the patients home medicines and have made adjustments as needed  Problem List / ED Course: Problem List Items Addressed This Visit   None Visit Diagnoses       Atypical chest pain    -  Primary                   This note was created using dictation software, which may contain spelling or grammatical errors.    Merdis Stalling, MD 01/17/24 [phone]

## 2024-01-17 NOTE — Telephone Encounter (Signed)
   Pt c/o of Chest Pain: STAT if active CP, including tightness, pressure, jaw pain, radiating pain to shoulder/upper arm/back, CP unrelieved by Nitro. Symptoms reported of SOB, nausea, vomiting, sweating.  1. Are you having CP right now? Sharp chest pain left side of chest yesterday . Today says she is having a pressure in her and heaviness in her chest at this time    2. Are you experiencing any other symptoms (ex. SOB, nausea, vomiting, sweating)? no   3. Is your CP continuous or coming and going? Comes and goes   4. Have you taken Nitroglycerin ? no   5. How long have you been experiencing CP? This been going on a  couple of weeks   6. If NO CP at time of call then end call with telling Pt to call back or call 911 if Chest pain returns prior to return call from triage team.

## 2024-01-17 NOTE — ED Triage Notes (Addendum)
 Yesterday evening was sitting on couch and had a few sharp pains in her left chest that took her breath away, pain passed quickly. Today having chest heaviness.  Has had pain and swelling in right leg, hx of DVT. States did US  of leg last week and was negative for DVT.

## 2024-03-31 ENCOUNTER — Telehealth: Payer: Self-pay | Admitting: Cardiovascular Disease

## 2024-03-31 NOTE — Telephone Encounter (Signed)
 Pt c/o medication issue:  1. Name of Medication: atorvastatin  (LIPITOR) 40 MG tablet   2. How are you currently taking this medication (dosage and times per day)? As written  3. Are you having a reaction (difficulty breathing--STAT)? No  4. What is your medication issue? Pt was told to stop this medication by her PCP.

## 2024-03-31 NOTE — Telephone Encounter (Signed)
 Patient states her PCP prescribed her rosuvastatin 40 mg daily and she has been taking this for over a year.  She no longer takes atorvastatin . Medication list updated.

## 2024-05-01 ENCOUNTER — Emergency Department (HOSPITAL_BASED_OUTPATIENT_CLINIC_OR_DEPARTMENT_OTHER)

## 2024-05-01 ENCOUNTER — Encounter (HOSPITAL_BASED_OUTPATIENT_CLINIC_OR_DEPARTMENT_OTHER): Payer: Self-pay | Admitting: Emergency Medicine

## 2024-05-01 ENCOUNTER — Other Ambulatory Visit: Payer: Self-pay

## 2024-05-01 ENCOUNTER — Emergency Department (HOSPITAL_BASED_OUTPATIENT_CLINIC_OR_DEPARTMENT_OTHER)
Admission: EM | Admit: 2024-05-01 | Discharge: 2024-05-01 | Disposition: A | Discharge status: Home / Self Care | Attending: Emergency Medicine | Admitting: Emergency Medicine

## 2024-05-01 DIAGNOSIS — Z7984 Long term (current) use of oral hypoglycemic drugs: Secondary | ICD-10-CM | POA: Insufficient documentation

## 2024-05-01 DIAGNOSIS — R519 Headache, unspecified: Secondary | ICD-10-CM | POA: Diagnosis present

## 2024-05-01 DIAGNOSIS — I10 Essential (primary) hypertension: Secondary | ICD-10-CM | POA: Diagnosis not present

## 2024-05-01 DIAGNOSIS — E119 Type 2 diabetes mellitus without complications: Secondary | ICD-10-CM | POA: Diagnosis not present

## 2024-05-01 DIAGNOSIS — Z79899 Other long term (current) drug therapy: Secondary | ICD-10-CM | POA: Insufficient documentation

## 2024-05-01 DIAGNOSIS — Z794 Long term (current) use of insulin: Secondary | ICD-10-CM | POA: Insufficient documentation

## 2024-05-01 DIAGNOSIS — Z7901 Long term (current) use of anticoagulants: Secondary | ICD-10-CM | POA: Insufficient documentation

## 2024-05-01 DIAGNOSIS — R531 Weakness: Secondary | ICD-10-CM | POA: Diagnosis not present

## 2024-05-01 DIAGNOSIS — Z87891 Personal history of nicotine dependence: Secondary | ICD-10-CM | POA: Insufficient documentation

## 2024-05-01 LAB — CBC WITH DIFFERENTIAL/PLATELET
Abs Immature Granulocytes: 0.02 K/uL (ref 0.00–0.07)
Basophils Absolute: 0 K/uL (ref 0.0–0.1)
Basophils Relative: 1 %
Eosinophils Absolute: 0.2 K/uL (ref 0.0–0.5)
Eosinophils Relative: 2 %
HCT: 37.4 % (ref 36.0–46.0)
Hemoglobin: 11.8 g/dL — ABNORMAL LOW (ref 12.0–15.0)
Immature Granulocytes: 0 %
Lymphocytes Relative: 42 %
Lymphs Abs: 2.8 K/uL (ref 0.7–4.0)
MCH: 25.8 pg — ABNORMAL LOW (ref 26.0–34.0)
MCHC: 31.6 g/dL (ref 30.0–36.0)
MCV: 81.7 fL (ref 80.0–100.0)
Monocytes Absolute: 0.7 K/uL (ref 0.1–1.0)
Monocytes Relative: 10 %
Neutro Abs: 3 K/uL (ref 1.7–7.7)
Neutrophils Relative %: 45 %
Platelets: 321 K/uL (ref 150–400)
RBC: 4.58 MIL/uL (ref 3.87–5.11)
RDW: 15.9 % — ABNORMAL HIGH (ref 11.5–15.5)
WBC: 6.7 K/uL (ref 4.0–10.5)
nRBC: 0 % (ref 0.0–0.2)

## 2024-05-01 LAB — COMPREHENSIVE METABOLIC PANEL WITH GFR
ALT: 28 U/L (ref 0–44)
AST: 25 U/L (ref 15–41)
Albumin: 4 g/dL (ref 3.5–5.0)
Alkaline Phosphatase: 115 U/L (ref 38–126)
Anion gap: 11 (ref 5–15)
BUN: 13 mg/dL (ref 6–20)
CO2: 26 mmol/L (ref 22–32)
Calcium: 9.3 mg/dL (ref 8.9–10.3)
Chloride: 104 mmol/L (ref 98–111)
Creatinine, Ser: 0.82 mg/dL (ref 0.44–1.00)
GFR, Estimated: >60 mL/min (ref >60)
Glucose, Bld: 94 mg/dL (ref 70–99)
Potassium: 3.8 mmol/L (ref 3.5–5.1)
Sodium: 141 mmol/L (ref 135–145)
Total Bilirubin: 0.2 mg/dL (ref 0.0–1.2)
Total Protein: 7 g/dL (ref 6.5–8.1)

## 2024-05-01 LAB — CBG MONITORING, ED: Glucose-Capillary: 119 mg/dL — ABNORMAL HIGH (ref 70–99)

## 2024-05-01 LAB — URINALYSIS, MICROSCOPIC (REFLEX)

## 2024-05-01 LAB — URINALYSIS, ROUTINE W REFLEX MICROSCOPIC
Bilirubin Urine: NEGATIVE
Glucose, UA: NEGATIVE mg/dL
Ketones, ur: NEGATIVE mg/dL
Nitrite: NEGATIVE
Protein, ur: 30 mg/dL — AB
Specific Gravity, Urine: >=1.03 (ref 1.005–1.030)
pH: 5.5 (ref 5.0–8.0)

## 2024-05-01 MED ORDER — DIPHENHYDRAMINE HCL 50 MG/ML IJ SOLN
25.0000 mg | Freq: Once | INTRAMUSCULAR | Status: AC
  Administered 2024-05-01: 25 mg via INTRAVENOUS
  Filled 2024-05-01: qty 1

## 2024-05-01 MED ORDER — PROCHLORPERAZINE EDISYLATE 10 MG/2ML IJ SOLN: 10.0000 mg | Freq: Once | INTRAMUSCULAR | Status: DC

## 2024-05-01 MED ORDER — KETOROLAC TROMETHAMINE 15 MG/ML IJ SOLN: 15.0000 mg | Freq: Once | INTRAMUSCULAR | Status: DC

## 2024-05-01 MED ORDER — DIPHENHYDRAMINE HCL 50 MG/ML IJ SOLN: 25.0000 mg | Freq: Once | INTRAMUSCULAR | Status: DC

## 2024-05-01 MED ORDER — FLUCONAZOLE 150 MG PO TABS
150.0000 mg | ORAL_TABLET | Freq: Once | ORAL | Status: AC
  Administered 2024-05-01: 150 mg via ORAL
  Filled 2024-05-01: qty 1

## 2024-05-01 MED ORDER — PROCHLORPERAZINE EDISYLATE 10 MG/2ML IJ SOLN
10.0000 mg | Freq: Once | INTRAMUSCULAR | Status: AC
  Administered 2024-05-01: 10 mg via INTRAVENOUS
  Filled 2024-05-01: qty 2

## 2024-05-01 MED ORDER — KETOROLAC TROMETHAMINE 15 MG/ML IJ SOLN
15.0000 mg | Freq: Once | INTRAMUSCULAR | Status: AC
  Administered 2024-05-01: 15 mg via INTRAVENOUS
  Filled 2024-05-01: qty 1

## 2024-05-01 NOTE — ED Notes (Signed)
 Pt d/c home per EDP order. Discharge summary reviewed, pt verbalizes understanding. Ambulatory off unit. NAD.

## 2024-05-01 NOTE — ED Triage Notes (Signed)
 Weakness for the last several days has had it before but this didn't go away denies cp, sob n/v drove herself here some slight h/a

## 2024-05-01 NOTE — Discharge Instructions (Addendum)
 Thank for let us  evaluate you today.  Please follow-up with your neurologist for further management

## 2024-05-01 NOTE — ED Notes (Addendum)
 Lab to add on urine culture to previously collected urine

## 2024-05-01 NOTE — Progress Notes (Signed)
 On call neurologist phone note  Called by ED PA at [facility] regarding this patient with multiple days of left-sided weakness.  Has had an episode of left-sided weakness that was deemed TIA 10 years ago and again last year although review of chart from last year reveals that she had some subjective weakness/paresthesias on the left side with some giveaway quality to the weakness-strokelike episode versus complex migraine was the differential at that time.  She is on anticoagulation for DVT.  From a stroke prevention standpoint, there is nothing further needs to be done.  I would recommend checking with the MRI folks over at [facility] if they can fit her in the scanner with her body habitus-BMI 65.23 with 5 foot 4 inch height-may not be possible since it was not possible last year when she was here. If she has continuing weakness after receiving migraine cocktail and needs admission for therapy assessments, she should be admitted to [facility] and at that time, if deemed appropriate, please call the inpatient neurology service for further recommendations.  These recommendations are being provided over the phone based on the information provided to me via the ED provider.  -- Eligio Lav, MD Neurologist [facility]

## 2024-05-01 NOTE — ED Provider Notes (Signed)
 [place] [facility] AT [facility] Provider Note   CSN: [id number] Arrival date & time: 05/01/24  1428     Patient presents with: Weakness   IYSIS GERMAIN is a 53 y.o. female with PMHx of suspected TIA (in 2024, 2014), migraine, OSA on CPAP, HTN, HLD, noninsulin dependent T2DM, DVT, IDA presents to ED for evaluation of left sided weakness. Reports that she occasionally has left sided weakness that typically last a few hours and resolves spontaneously.  Weakness is described as feeling heavy and weak on the left side.  Endorses left side of her face feels different but is unable to endorse how.  Denies numbness or paresthesia.  She also reports that she feels that she is walking differently.  Reports associated slight headache behind left eye that started with weakness on Saturday morning.  Headache is described as 1/10 in intensity and as an ache.  Denies visual disturbance.  Follows neurology at [facility] for sleep apnea.  Sought ED evaluation as she has had continuous left-sided weakness since waking up on Saturday morning.  Reports no complaints of weakness before going to sleep on Friday.  Denies cough, congestion, urinary symptoms, NVD, fevers, recent falls.    Weakness      Prior to Admission medications   Medication Sig Start Date End Date Taking? Authorizing Provider  amoxicillin  (AMOXIL ) 500 MG capsule Take 1 capsule (500 mg total) by mouth 3 (three) times daily. 12/17/23   Sheryle Hamilton, DMD  apixaban  (ELIQUIS ) 5 MG TABS tablet Take 5 mg by mouth 2 (two) times daily. Patient not taking: Reported on 12/14/2023 04/13/23   [provider]  Ascorbic Acid (VITAMIN C) 1000 MG tablet Take 1,000 mg by mouth daily.    [provider]  budesonide-formoterol (SYMBICORT) 160-4.5 MCG/ACT inhaler Inhale 2 puffs into the lungs daily as needed (asthma). 10/26/23   [provider]  D3-50 1.25 MG (50000 UT) capsule  Take 50,000 Units by mouth once a week. 05/12/23   [provider]  ferrous sulfate 325 (65 FE) MG EC tablet Take 325 mg by mouth every other day.    [provider]  furosemide  (LASIX ) 20 MG tablet Take 20 mg by mouth every other day.    [provider]  levonorgestrel (MIRENA) 20 MCG/DAY IUD 1 each by Intrauterine route once.    [provider]  losartan (COZAAR) 100 MG tablet Take 100 mg by mouth daily. 03/26/23   [provider]  meloxicam (MOBIC) 15 MG tablet Take 15 mg by mouth daily.    [provider]  metFORMIN (GLUCOPHAGE) 500 MG tablet Take 500 mg by mouth daily. 02/12/23   [provider]  nitroGLYCERIN  (NITROSTAT ) 0.4 MG SL tablet Place 1 tablet (0.4 mg total) under the tongue every 5 (five) minutes as needed for chest pain (CP or SOB). 11/07/22   Cheryle Page, MD  rosuvastatin (CRESTOR) 40 MG tablet Take 40 mg by mouth daily.    [provider]  Semaglutide, 1 MG/DOSE, (OZEMPIC, 1 MG/DOSE,) 2 MG/1.5ML SOPN Inject 1 mg into the skin once a week.    [provider]    Allergies: Patient has no known allergies.    Review of Systems  Neurological:  Positive for weakness.    Updated Vital Signs BP (!) 140/116   Pulse 69   Temp 98.3 F (36.8 C) (Oral)   Resp 18   Ht 5' 4 (1.626 m)   [place] ROLLEN)  172.4 kg   SpO2 98%   BMI 65.23 kg/m   Physical Exam Vitals and nursing note reviewed.  Constitutional:      General: She is not in acute distress.    Appearance: Normal appearance. She is not ill-appearing.  HENT:     Head: Normocephalic and atraumatic.     Mouth/Throat:     Mouth: Mucous membranes are moist.     Pharynx: No oropharyngeal exudate or posterior oropharyngeal erythema.  Eyes:     General: No visual field deficit or scleral icterus.       Right eye: No discharge.        Left eye: No discharge.     Conjunctiva/sclera: Conjunctivae normal.  Cardiovascular:     Rate and Rhythm: Normal  rate.     Pulses: Normal pulses.  Pulmonary:     Effort: Pulmonary effort is normal. No respiratory distress.     Breath sounds: Normal breath sounds. No stridor. No wheezing or rhonchi.  Chest:     Chest wall: No tenderness.  Abdominal:     General: There is no distension.     Palpations: Abdomen is soft. There is no mass.     Tenderness: There is no abdominal tenderness. There is no guarding.  Musculoskeletal:     Cervical back: Normal range of motion and neck supple. No rigidity or tenderness.  Lymphadenopathy:     Cervical: No cervical adenopathy.  Skin:    General: Skin is warm.     Capillary Refill: Capillary refill takes less than 2 seconds.     Coloration: Skin is not jaundiced or pale.  Neurological:     Mental Status: She is alert and oriented to person, place, and time.     GCS: GCS eye subscore is 4. GCS verbal subscore is 5. GCS motor subscore is 6.     Cranial Nerves: No dysarthria or facial asymmetry.     Sensory: No sensory deficit.     Motor: Weakness and pronator drift present. No tremor, abnormal muscle tone or seizure activity.     Coordination: Coordination normal. Finger-Nose-Finger Test normal.     Comments: Prior to migraine cocktail: Motor 4/5 of LUE and LLE.  Left-sided pronator drift.  No sensory deficit to face bilaterally.  No facial droop.  No aphasia nor slurred speech.  Sensation 2/2 of BUE, BUE.  Unable to perform heel-to-shin test due to body habitus Following migraine cocktail: motor 5/5 of BUE and BLE. No pronator drift     (all labs ordered are listed, but only abnormal results are displayed) Labs Reviewed  CBC WITH DIFFERENTIAL/PLATELET - Abnormal; Notable for the following components:      Result Value   Hemoglobin 11.8 (*)    MCH 25.8 (*)    RDW 15.9 (*)    All other components within normal limits  URINALYSIS, ROUTINE W REFLEX MICROSCOPIC - Abnormal; Notable for the following components:   APPearance TURBID (*)    Hgb urine dipstick  TRACE (*)    Protein, ur 30 (*)    Leukocytes,Ua TRACE (*)    All other components within normal limits  URINALYSIS, MICROSCOPIC (REFLEX) - Abnormal; Notable for the following components:   Bacteria, UA MANY (*)    All other components within normal limits  CBG MONITORING, ED - Abnormal; Notable for the following components:   Glucose-Capillary 119 (*)    All other components within normal limits  URINE CULTURE  COMPREHENSIVE METABOLIC PANEL WITH GFR  EKG: EKG Interpretation Date/Time:  Monday May 01 2024 14:39:41 EDT Ventricular Rate:  86 PR Interval:  138 QRS Duration:  103 QT Interval:  366 QTC Calculation: 438 R Axis:   8  Text Interpretation: Sinus rhythm Low voltage, precordial leads Borderline T wave abnormalities Confirmed by Ula Barter [id number]) on 05/01/2024 2:48:44 PM  Radiology: CT Head Wo Contrast Result Date: 05/01/2024 CLINICAL DATA:  Headache, intermittent left-sided weakness since Saturday EXAM: CT HEAD WITHOUT CONTRAST TECHNIQUE: Contiguous axial images were obtained from the base of the skull through the vertex without intravenous contrast. RADIATION DOSE REDUCTION: This exam was performed according to the departmental dose-optimization program which includes automated exposure control, adjustment of the mA and/or kV according to patient size and/or use of iterative reconstruction technique. COMPARISON:  11/13/2022 FINDINGS: Brain: No acute infarct or hemorrhage. Lateral ventricles and midline structures are unremarkable. No acute extra-axial fluid collections. No mass effect. Vascular: No hyperdense vessel or unexpected calcification. Skull: Normal. Negative for fracture or focal lesion. Sinuses/Orbits: No acute finding. Other: None. IMPRESSION: 1. No acute intracranial process. Electronically Signed   By: Ozell Daring M.D.   On: 05/01/2024 15:44     Medications Ordered in the ED  prochlorperazine  (COMPAZINE ) injection 10 mg (10 mg Intravenous Given 05/01/24  1746)  diphenhydrAMINE  (BENADRYL ) injection 25 mg (25 mg Intravenous Given 05/01/24 1745)  ketorolac  (TORADOL ) 15 MG/ML injection 15 mg (15 mg Intravenous Given 05/01/24 1744)  fluconazole  (DIFLUCAN ) tablet 150 mg (150 mg Oral Given 05/01/24 1815)    Clinical Course as of 05/01/24 2109  Mon May 01, 2024  1742 Squamous Epithelial / HPF: 21-50 UA contaminated. Denies urinary symptoms. Provided fluconazole  for yeast infection. Will send urine culture [LB]  1744 Hemoglobin(!): 11.8 History of IDA.  Baseline 10.1-11.6.  No complaints of rectal bleeding, hematochezia, significant vaginal bleeding, dizziness, nor lightheadedness [LB]    Clinical Course User Index [LB] Minnie Tinnie BRAVO, PA                                 Medical Decision Making Amount and/or Complexity of Data Reviewed Labs: ordered. Decision-making details documented in ED Course.  Risk Prescription drug management.   Patient presents to the ED for concern of left-sided weakness, this involves an extensive number of treatment options, and is a complaint that carries with it a high risk of complications and morbidity.  The differential diagnosis includes electrolyte abnormality, infection, UTI, CVA, TIA, complex migraine, hypoglycemia, hyperglycemia, ICH   Co morbidities that complicate the patient evaluation  Past medical history of suspected TIA Chronic AC with Eliquis    Additional history obtained:  Additional history obtained from Nursing and Outside Medical Records   External records from outside source obtained and reviewed including triage note, ED note from 10/2022   Lab Tests:  I Ordered, and personally interpreted labs.  The pertinent results include:   Hgb 11.8 CBG 119 UA contaminated with yeast   Imaging Studies ordered:  I ordered imaging studies including CT head I independently visualized and interpreted imaging which showed no CH I agree with the radiologist interpretation   Cardiac  Monitoring:  The patient was maintained on a cardiac monitor.  I personally viewed and interpreted the cardiac monitored which showed an underlying rhythm of: NSR at 86 bpm with no T wave or ST abnormalities   Medicines ordered and prescription drug management:  I ordered medication including Compazine , Benadryl , Toradol , fluconazole  for  migraine, yeast infection Reevaluation of the patient after these medicines showed that the patient improved I have reviewed the patients home medicines and have made adjustments as needed    Consultations Obtained:  I requested consultation with Neurology Dr. Arora,  and discussed lab and imaging findings as well as pertinent plan - they recommend:  Migraine cocktail to see if symptoms improve MRI if possible secondary to body habitus if symptoms do not improve following migraine cocktail If unable to obtain MRI and patient continues to have focal deficits following migraine cocktail, patient can be admitted for repeat CT scan in the next 24 hours as well as PT, OT, inpatient neurology follow-up If symptoms improved following migraine cocktail, symptoms likely secondary to complex migraine and she can follow-up outpatient with neurology for MRI, further workup See neurology note   Problem List / ED Course:  Left-sided weakness Prior to migraine cocktail physical exam notable for 4/5 weakness of LUE, LLE.  No sensory deficits, slurred speech, aphasia, nor facial droop Unfortunately, symptoms have been persisting for almost 72 hours as she noticed symptoms following waking on Saturday morning.  No complaints when going to sleep on Friday night.  Is appropriately anticoagulated with Eliquis  twice daily.  Has not missed a dose Suspected TIA 10 years ago and 1 year ago based on symptoms of left-sided weakness.  Had normal CT, CTA.  Had no deficits following and symptoms resolved.  Was unable to get MRI based on body habitus then.  Since then, gained 9 kg so  likely would face similar difficulties Was most recently evaluated by neurology on 11/06/2022 but was unable to get MRI.  Differentials for left-sided weakness at that time were CVA versus complex migraine Lab work unremarkable for etiology of headache, left-sided weakness. No hypoglycemia nor hyperglycemia. UA is completely contaminated and unhelpful.  Does show some yeast.  Provided fluconazole  for suspected yeast infection.  Will send urine culture and see if this grows anything CT wo signs of ICH, obvious abnormalities Following migraine cocktail, patient reports significant improvement of symptoms.  On my exam, I do not see any left-sided deficits.  Continues to have no aphasia, slurred speech.  No pronator drift.  Able to ambulate without difficulty, limp, nor weakness Patient does have neurology follow-up and can follow-up with them to schedule outpatient MRI that would be appropriate for her body habitus  Reevaluation:  After the interventions noted above, I reevaluated the patient and found that they have :resolved   Social Determinants of Health:  Former tobacco abuse Has neurology, PCP follow-up   Dispostion:  After consideration of the diagnostic results and the patients response to treatment, I feel that the patent would benefit from outpatient management with neurology follow-up.   Discussed ED workup, disposition, return to ED precautions with patient who expresses understanding agrees with plan.  All questions answered to their satisfaction.  They are agreeable to plan.  Discharge instructions provided on paperwork  Final diagnoses:  Left-sided weakness  Acute nonintractable headache, unspecified headache type    ED Discharge Orders     None        Minnie Tinnie BRAVO, PA 05/01/24 2118    Ruthe Cornet, DO 05/01/24 2121

## 2024-05-02 LAB — URINE CULTURE: Culture: <10000 — AB
